# Patient Record
Sex: Female | Born: 1995 | Hispanic: No | Marital: Single | Smoking: Never smoker
Health system: Southern US, Community
[De-identification: ages and names within clinical notes are randomized; demographics above are authoritative.]

## PROBLEM LIST (undated history)

## (undated) ENCOUNTER — Inpatient Hospital Stay: Payer: Self-pay

## (undated) DIAGNOSIS — R7303 Prediabetes: Secondary | ICD-10-CM

## (undated) DIAGNOSIS — O24419 Gestational diabetes mellitus in pregnancy, unspecified control: Secondary | ICD-10-CM

## (undated) DIAGNOSIS — E559 Vitamin D deficiency, unspecified: Secondary | ICD-10-CM

## (undated) DIAGNOSIS — N946 Dysmenorrhea, unspecified: Secondary | ICD-10-CM

## (undated) HISTORY — DX: Dysmenorrhea, unspecified: N94.6

## (undated) HISTORY — PX: NO PAST SURGERIES: SHX2092

## (undated) HISTORY — DX: Gestational diabetes mellitus in pregnancy, unspecified control: O24.419

## (undated) HISTORY — DX: Vitamin D deficiency, unspecified: E55.9

## (undated) HISTORY — DX: Prediabetes: R73.03

---
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
## 2018-01-06 ENCOUNTER — Encounter: Payer: Self-pay | Admitting: Obstetrics and Gynecology

## 2018-01-06 ENCOUNTER — Other Ambulatory Visit: Payer: Self-pay

## 2018-01-06 ENCOUNTER — Ambulatory Visit (INDEPENDENT_AMBULATORY_CARE_PROVIDER_SITE_OTHER): Payer: 59 | Admitting: Obstetrics and Gynecology

## 2018-01-06 VITALS — BP 118/72 | HR 63 | Ht 64.0 in | Wt 155.0 lb

## 2018-01-06 DIAGNOSIS — N898 Other specified noninflammatory disorders of vagina: Secondary | ICD-10-CM | POA: Diagnosis not present

## 2018-01-06 DIAGNOSIS — Z113 Encounter for screening for infections with a predominantly sexual mode of transmission: Secondary | ICD-10-CM

## 2018-01-06 DIAGNOSIS — N941 Unspecified dyspareunia: Secondary | ICD-10-CM | POA: Diagnosis not present

## 2018-01-06 DIAGNOSIS — Z3009 Encounter for other general counseling and advice on contraception: Secondary | ICD-10-CM

## 2018-01-06 DIAGNOSIS — Z01419 Encounter for gynecological examination (general) (routine) without abnormal findings: Secondary | ICD-10-CM | POA: Diagnosis not present

## 2018-01-06 LAB — POCT WET PREP WITH KOH
CLUE CELLS WET PREP PER HPF POC: NEGATIVE
KOH Prep POC: NEGATIVE
RBC Wet Prep HPF POC: POSITIVE
Trichomonas, UA: NEGATIVE
Yeast Wet Prep HPF POC: NEGATIVE

## 2018-01-06 NOTE — Patient Instructions (Signed)
I value your feedback and entrusting us with your care. If you get a [facility] patient survey, I would appreciate you taking the time to let us know about your experience today. Thank you! 

## 2018-01-06 NOTE — Progress Notes (Signed)
PCP:  Patient, [name]   Chief Complaint  Patient presents with  . Gynecologic Exam    Pain w/intercourse and discharge white not itchy x 6-7 mos     HPI:      [name] is a 22 y.o. No obstetric history on file. who LMP was Patient's last menstrual period was 01/05/2018., presents today for her NP annual examination.  Her menses are every 1-3 months (normal for pt and getting more regular), lasting 4-7 days.  Dysmenorrhea mild, occurring first 1-2 days of flow. She does not have intermenstrual bleeding.  Sex activity: single partner, contraception - condoms. Declines other BC. Has had vaginal pain with sex the past 3 times. Doesn't use lubricants but feels lubricated enough naturally.  Hx of STDs: none Complains of white, creamy d/c wiith "grits inside" for the past 6-7 months, no itch/irritation/odor.   There is no FH of breast cancer. There is no FH of ovarian cancer. The patient does not do self-breast exams.  Tobacco use: The patient denies current or previous tobacco use. Alcohol use: none No drug use.  Exercise: not active  She does not get adequate calcium and Vitamin D in her diet.  She thinks she had [place] vaccine.   Past Medical History:  Diagnosis Date  . Dysmenorrhea     Past Surgical History:  Procedure Laterality Date  . NO PAST SURGERIES      Family History  Problem Relation Age of Onset  . Diabetes Mother   . Hypertension Mother   . Diabetes Father   . Hypertension Father     Social History   Socioeconomic History  . Marital status: Single    Spouse name: Not on file  . Number of children: Not on file  . Years of education: Not on file  . Highest education level: Not on file  Occupational History  . Not on file  Social Needs  . Financial resource strain: Not on file  . Food insecurity:    Worry: Not on file    Inability: Not on file  . Transportation needs:    Medical: Not on file    Non-medical: Not on file  Tobacco Use    . Smoking status: Never Smoker  . Smokeless tobacco: Never Used  Substance and Sexual Activity  . Alcohol use: Never    Frequency: Never  . Drug use: Never  . Sexual activity: Yes    Partners: Male    Birth control/protection: Condom  Lifestyle  . Physical activity:    Days per week: 0 days    Minutes per session: Not on file  . Stress: Not on file  Relationships  . Social connections:    Talks on phone: Not on file    Gets together: Not on file    Attends religious service: Not on file    Active member of club or organization: Not on file    Attends meetings of clubs or organizations: Not on file    Relationship status: Not on file  . Intimate partner violence:    Fear of current or ex partner: Not on file    Emotionally abused: Not on file    Physically abused: Not on file    Forced sexual activity: Not on file  Other Topics Concern  . Not on file  Social History Narrative  . Not on file    No outpatient medications prior to visit.   No facility-administered medications prior to visit.  ROS:  Review of Systems  Constitutional: Negative for fatigue, fever and unexpected weight change.  Respiratory: Negative for cough, shortness of breath and wheezing.   Cardiovascular: Negative for chest pain, palpitations and leg swelling.  Gastrointestinal: Negative for blood in stool, constipation, diarrhea, nausea and vomiting.  Endocrine: Negative for cold intolerance, heat intolerance and polyuria.  Genitourinary: Positive for dyspareunia and vaginal discharge. Negative for dysuria, flank pain, frequency, genital sores, hematuria, menstrual problem, pelvic pain, urgency, vaginal bleeding and vaginal pain.  Musculoskeletal: Negative for back pain, joint swelling and myalgias.  Skin: Negative for rash.  Neurological: Negative for dizziness, syncope, light-headedness, numbness and headaches.  Hematological: Negative for adenopathy.  Psychiatric/Behavioral: Positive for  dysphoric mood. Negative for agitation, confusion, sleep disturbance and suicidal ideas. The patient is not nervous/anxious.    BREAST: No symptoms   Objective: BP 118/72 (BP Location: Left Arm, Patient Position: Sitting, Cuff Size: Normal)   Pulse 63   Ht  (1.626 m)   Wt 155 lb (70.3 kg)   LMP 01/05/2018   BMI 26.61 kg/m    Physical Exam  Constitutional: She is oriented to person, place, and time. She appears well-developed and well-nourished.  Genitourinary: Uterus normal. There is no rash or tenderness on the right labia. There is no rash or tenderness on the left labia. There is bleeding in the vagina. No erythema or tenderness in the vagina. No vaginal discharge found. Right adnexum does not display mass and does not display tenderness. Left adnexum does not display mass and does not display tenderness. Cervix does not exhibit motion tenderness or polyp. Uterus is not enlarged or tender.  Neck: Normal range of motion. No thyromegaly present.  Cardiovascular: Normal rate, regular rhythm and normal heart sounds.  No murmur heard. Pulmonary/Chest: Effort normal and breath sounds normal. Right breast exhibits no mass, no nipple discharge, no skin change and no tenderness. Left breast exhibits no mass, no nipple discharge, no skin change and no tenderness.  Abdominal: Soft. There is no tenderness. There is no guarding.  Musculoskeletal: Normal range of motion.  Neurological: She is alert and oriented to person, place, and time. No cranial nerve deficit.  Psychiatric: She has a normal mood and affect. Her behavior is normal.  Vitals reviewed.   Results: Results for orders placed or performed in visit on 01/06/18 (from the past 24 hour(s))  POCT Wet Prep with KOH     Status: Normal   Collection Time: 01/06/18  4:44 PM  Result Value Ref Range   Trichomonas, UA Negative    Clue Cells Wet Prep HPF POC neg    Epithelial Wet Prep HPF POC  Few, Moderate, Many, Too numerous to count     Yeast Wet Prep HPF POC neg    Bacteria Wet Prep HPF POC  Few   RBC Wet Prep HPF POC pos    WBC Wet Prep HPF POC     KOH Prep POC Negative Negative   Having period today  Assessment/Plan: Encounter for annual routine gynecological examination  Screening for STD (sexually transmitted disease) - Plan: Chlamydia/Gonococcus/Trichomonas, NAA  Vaginal discharge - Neg wet prep today but bleeding. Check gon/chlam. F/u prn.  - Plan: POCT Wet Prep with KOH  Dyspareunia in female - Vag sx. Neg wet prep/exam. Check gon/chlam. If neg, question spermicide. Use spermicide-free condoms. F/u after menses prn sx.  Encounter for other general counseling or advice on contraception - Pt declines BC. Condoms. F/u prn.  GYN counsel STD prevention, adequate intake of calcium and vitamin D, diet and exercise     F/U  Return in about 1 year (around 01/07/2019).  Carisha Kantor B. Santoria Chason, PA-C 01/06/2018 4:50 PM

## 2018-01-07 MED ORDER — NORETHIN ACE-ETH ESTRAD-FE 1-20 MG-MCG(24) PO TABS: 1.0000 | ORAL_TABLET | Freq: Every day | ORAL | 3 refills | Status: DC

## 2018-01-07 NOTE — Telephone Encounter (Signed)
Spoke with pt re: OCPs. LMP 01/05/18. Never been on BC. No hx of HTN/DVTs/seizures. Rx lomedia eRxd. Start this wk. Condoms for 1 mo. F/u prn.

## 2018-01-08 LAB — CHLAMYDIA/GONOCOCCUS/TRICHOMONAS, NAA
CHLAMYDIA BY NAA: NEGATIVE
GONOCOCCUS BY NAA: NEGATIVE
TRICH VAG BY NAA: NEGATIVE

## 2018-06-24 ENCOUNTER — Encounter: Payer: Self-pay | Admitting: Obstetrics and Gynecology

## 2018-06-24 ENCOUNTER — Ambulatory Visit: Payer: Managed Care, Other (non HMO) | Admitting: Obstetrics and Gynecology

## 2018-06-24 ENCOUNTER — Other Ambulatory Visit (HOSPITAL_COMMUNITY)
Admission: RE | Admit: 2018-06-24 | Discharge: 2018-06-24 | Disposition: A | Discharge status: Home / Self Care | Payer: Managed Care, Other (non HMO) | Source: Ambulatory Visit | Attending: Obstetrics and Gynecology | Admitting: Obstetrics and Gynecology

## 2018-06-24 VITALS — BP 108/60 | HR 71 | Ht 64.0 in | Wt 155.0 lb

## 2018-06-24 DIAGNOSIS — Z113 Encounter for screening for infections with a predominantly sexual mode of transmission: Secondary | ICD-10-CM

## 2018-06-24 DIAGNOSIS — N898 Other specified noninflammatory disorders of vagina: Secondary | ICD-10-CM | POA: Insufficient documentation

## 2018-06-24 DIAGNOSIS — Z3041 Encounter for surveillance of contraceptive pills: Secondary | ICD-10-CM | POA: Diagnosis not present

## 2018-06-24 DIAGNOSIS — N760 Acute vaginitis: Secondary | ICD-10-CM | POA: Diagnosis not present

## 2018-06-24 LAB — POCT WET PREP WITH KOH
Clue Cells Wet Prep HPF POC: NEGATIVE
KOH PREP POC: NEGATIVE
TRICHOMONAS UA: NEGATIVE
Yeast Wet Prep HPF POC: NEGATIVE

## 2018-06-24 MED ORDER — FLUCONAZOLE 150 MG PO TABS: 150.0000 mg | ORAL_TABLET | Freq: Once | ORAL | 0 refills | Status: DC

## 2018-06-24 NOTE — Patient Instructions (Signed)
I value your feedback and entrusting us with your care. If you get a [facility] patient survey, I would appreciate you taking the time to let us know about your experience today. Thank you! 

## 2018-06-24 NOTE — Progress Notes (Signed)
Patient, [name]   Chief Complaint  Patient presents with  . STD screening    HPI:      [name] is a 22 y.o. [id number] who LMP was Patient's last menstrual period was 06/10/2018 (approximate)., presents today for STD testing.  Had neg STD testing 5/19 but had a new partner since then. Pt denies any known exposures, but wants to be safe. Used condoms most of the time. Was on OCPs but stopped them when no longer sex active. Doesn't plan to be sex active in near future. No side effects with OCPs 5/19. Has had vaginal irritation and swelling for the past 3-4 days. No increased d/c, odor. No recent abx use.    Past Medical History:  Diagnosis Date  . Dysmenorrhea     Past Surgical History:  Procedure Laterality Date  . NO PAST SURGERIES      Family History  Problem Relation Age of Onset  . Diabetes Mother   . Hypertension Mother   . Diabetes Father   . Hypertension Father     Social History   Socioeconomic History  . Marital status: Single    Spouse name: Not on file  . Number of children: Not on file  . Years of education: Not on file  . Highest education level: Not on file  Occupational History  . Not on file  Social Needs  . Financial resource strain: Not on file  . Food insecurity:    Worry: Not on file    Inability: Not on file  . Transportation needs:    Medical: Not on file    Non-medical: Not on file  Tobacco Use  . Smoking status: Never Smoker  . Smokeless tobacco: Never Used  Substance and Sexual Activity  . Alcohol use: Never    Frequency: Never  . Drug use: Never  . Sexual activity: Yes    Partners: Male    Birth control/protection: None  Lifestyle  . Physical activity:    Days per week: 0 days    Minutes per session: Not on file  . Stress: Not on file  Relationships  . Social connections:    Talks on phone: Not on file    Gets together: Not on file    Attends religious service: Not on file    Active member of club or  organization: Not on file    Attends meetings of clubs or organizations: Not on file    Relationship status: Not on file  . Intimate partner violence:    Fear of current or ex partner: Not on file    Emotionally abused: Not on file    Physically abused: Not on file    Forced sexual activity: Not on file  Other Topics Concern  . Not on file  Social History Narrative  . Not on file    Outpatient Medications Prior to Visit  Medication Sig Dispense Refill  . Norethindrone Acetate-Ethinyl Estrad-FE (MICROGESTIN 24 FE) 1-20 MG-MCG(24) tablet Take 1 tablet by mouth daily. (Patient not taking: Reported on 06/24/2018) 84 tablet 3   No facility-administered medications prior to visit.     ROS:  Review of Systems  Constitutional: Negative for fever.  Gastrointestinal: Negative for blood in stool, constipation, diarrhea, nausea and vomiting.  Genitourinary: Negative for dyspareunia, dysuria, flank pain, frequency, hematuria, urgency, vaginal bleeding, vaginal discharge and vaginal pain.  Musculoskeletal: Negative for back pain.  Skin: Negative for rash.      OBJECTIVE:  Vitals:  BP 108/60   Pulse 71   Ht 5\' 4"  (1.626 m)   Wt 155 lb (70.3 kg)   LMP 06/10/2018 (Approximate)   BMI 26.61 kg/m   Physical Exam  Constitutional: She is oriented to person, place, and time. Vital signs are normal. She appears well-developed.  Pulmonary/Chest: Effort normal.  Genitourinary: Uterus normal. There is no rash, tenderness or lesion on the right labia. There is no rash, tenderness or lesion on the left labia. Uterus is not enlarged and not tender. Cervix exhibits no motion tenderness. Right adnexum displays no mass and no tenderness. Left adnexum displays no mass and no tenderness. There is erythema in the vagina. No tenderness in the vagina. Vaginal discharge found.  Musculoskeletal: Normal range of motion.  Neurological: She is alert and oriented to person, place, and time.  Psychiatric: She  has a normal mood and affect. Her behavior is normal. Thought content normal.  Vitals reviewed.   Results: Results for orders placed or performed in visit on 06/24/18 (from the past 24 hour(s))  POCT Wet Prep with KOH     Status: Normal   Collection Time: 06/24/18  9:57 AM  Result Value Ref Range   Trichomonas, UA Negative    Clue Cells Wet Prep HPF POC neg    Epithelial Wet Prep HPF POC     Yeast Wet Prep HPF POC neg    Bacteria Wet Prep HPF POC     RBC Wet Prep HPF POC     WBC Wet Prep HPF POC     KOH Prep POC Negative Negative     Assessment/Plan: Acute vaginitis - Pos sx/ exam. Neg wet prep. Treat empirically with Rx diflucan. F/u prn. Dove sens skin soap. - Plan: POCT Wet Prep with KOH, fluconazole (DIFLUCAN) 150 MG tablet  Vaginal discharge - Plan: POCT Wet Prep with KOH, Cervicovaginal ancillary only  Screening for STD (sexually transmitted disease) - Will call if results pos. Condoms.  - Plan: Cervicovaginal ancillary only  Encounter for surveillance of contraceptive pills - Restart OCPs if going to be sex active. Has 1 yr Rx sent 5/19.    Meds ordered this encounter  Medications  . fluconazole (DIFLUCAN) 150 MG tablet    Sig: Take 1 tablet (150 mg total) by mouth once for 1 dose.    Dispense:  1 tablet    Refill:  0    Order Specific Question:   Supervising Provider    Answer:   Nadara Mustard [[id number]]      Return if symptoms worsen or fail to improve.  Katlin Ciszewski B. Chenise Mulvihill, PA-C 06/24/2018 9:58 AM

## 2018-06-25 LAB — CERVICOVAGINAL ANCILLARY ONLY
Chlamydia: NEGATIVE
Neisseria Gonorrhea: NEGATIVE
Trichomonas: NEGATIVE

## 2018-08-20 ENCOUNTER — Ambulatory Visit
Admission: EM | Admit: 2018-08-20 | Discharge: 2018-08-20 | Disposition: A | Discharge status: Home / Self Care | Payer: Managed Care, Other (non HMO) | Attending: Family Medicine | Admitting: Family Medicine

## 2018-08-20 ENCOUNTER — Encounter: Payer: Self-pay | Admitting: Emergency Medicine

## 2018-08-20 ENCOUNTER — Other Ambulatory Visit: Payer: Self-pay

## 2018-08-20 DIAGNOSIS — J020 Streptococcal pharyngitis: Secondary | ICD-10-CM | POA: Diagnosis not present

## 2018-08-20 DIAGNOSIS — Z202 Contact with and (suspected) exposure to infections with a predominantly sexual mode of transmission: Secondary | ICD-10-CM | POA: Diagnosis not present

## 2018-08-20 LAB — CHLAMYDIA/NGC RT PCR (ARMC ONLY)
Chlamydia Tr: NOT DETECTED
N gonorrhoeae: NOT DETECTED

## 2018-08-20 LAB — RAPID STREP SCREEN (MED CTR MEBANE ONLY): Streptococcus, Group A Screen (Direct): POSITIVE — AB

## 2018-08-20 LAB — WET PREP, GENITAL
Clue Cells Wet Prep HPF POC: NONE SEEN
SPERM: NONE SEEN
Trich, Wet Prep: NONE SEEN
YEAST WET PREP: NONE SEEN

## 2018-08-20 MED ORDER — AMOXICILLIN 500 MG PO TABS: 500.0000 mg | ORAL_TABLET | Freq: Two times a day (BID) | ORAL | 0 refills | Status: DC

## 2018-08-20 NOTE — ED Provider Notes (Signed)
[facility]    CSN: [id number] Arrival date & time: 08/20/18  1630  History   Chief Complaint Chief Complaint  Patient presents with  . Exposure to STD   HPI  23 year old female presents for above complaint.  She also reports sore throat.  Patient reports that she was notified today that her significant other tested positive for gonorrhea recently.  Patient states that she has had some vaginal itching intermittently over the past 2 to 3 weeks reports intermittent abdominal pain but she is not sure that is related.  No fever.  No vaginal discharge.  No other associated symptoms.  Additionally, patient states that she developed sore throat today.  She has had recent contact with a sick relative.  No other reported symptoms.  No other complaints.  PMH, Surgical Hx, Family Hx, Social History reviewed and updated as below.  Past Medical History:  Diagnosis Date  . Dysmenorrhea    Past Surgical History:  Procedure Laterality Date  . NO PAST SURGERIES      OB History    Gravida  0   Para  0   Term  0   Preterm  0   AB  0   Living  0     SAB  0   TAB  0   Ectopic  0   Multiple  0   Live Births  0            Home Medications    Prior to Admission medications   Medication Sig Start Date End Date Taking? Authorizing Provider  amoxicillin (AMOXIL) 500 MG tablet Take 1 tablet (500 mg total) by mouth 2 (two) times daily. 08/20/18   Tommie Samsook, Ailis Rigaud G, DO    Family History Family History  Problem Relation Age of Onset  . Diabetes Mother   . Hypertension Mother   . Diabetes Father   . Hypertension Father     Social History Social History   Tobacco Use  . Smoking status: Never Smoker  . Smokeless tobacco: Never Used  Substance Use Topics  . Alcohol use: Never    Frequency: Never  . Drug use: Never     Allergies   Patient has no known allergies.   Review of Systems Review of Systems  Constitutional: Negative for fever.  HENT: Positive  for sore throat.   Genitourinary: Negative for vaginal discharge.   Physical Exam Triage Vital Signs ED Triage Vitals  Enc Vitals Group     BP 08/20/18 1657 116/69     Pulse Rate 08/20/18 1657 63     Resp 08/20/18 1657 20     Temp 08/20/18 1657 98.2 F (36.8 C)     Temp Source 08/20/18 1657 Oral     SpO2 08/20/18 1657 100 %     Weight 08/20/18 1654 155 lb (70.3 kg)     Height 08/20/18 1654 5\' 4"  (1.626 m)     Head Circumference --      Peak Flow --      Pain Score 08/20/18 1654 0     Pain Loc --      Pain Edu? --      Excl. in GC? --    Updated Vital Signs BP 116/69   Pulse 63   Temp 98.2 F (36.8 C) (Oral)   Resp 20   Ht 5\' 4"  (1.626 m)   Wt 70.3 kg   LMP 08/17/2018   SpO2 100%   BMI 26.61 kg/m  Visual Acuity Right Eye Distance:   Left Eye Distance:   Bilateral Distance:    Right Eye Near:   Left Eye Near:    Bilateral Near:     Physical Exam Vitals signs and nursing note reviewed.  Constitutional:      General: She is not in acute distress. HENT:     Head: Normocephalic and atraumatic.     Mouth/Throat:     Comments: Oropharynx with moderate erythema.  Enlarged tonsils. Cardiovascular:     Rate and Rhythm: Normal rate and regular rhythm.  Pulmonary:     Effort: Pulmonary effort is normal.     Breath sounds: No wheezing, rhonchi or rales.  Abdominal:     General: There is no distension.     Palpations: Abdomen is soft.     Tenderness: There is no abdominal tenderness.  Neurological:     Mental Status: She is alert.  Psychiatric:        Mood and Affect: Mood normal.        Behavior: Behavior normal.    UC Treatments / Results  Labs (all labs ordered are listed, but only abnormal results are displayed) Labs Reviewed  WET PREP, GENITAL - Abnormal; Notable for the following components:      Result Value   WBC, Wet Prep HPF POC FEW (*)    All other components within normal limits  RAPID STREP SCREEN ([facility] ONLY) - Abnormal; Notable  for the following components:   Streptococcus, Group A Screen (Direct) POSITIVE (*)    All other components within normal limits  CHLAMYDIA/NGC RT PCR [facility] ONLY)    EKG None  Radiology No results found.  Procedures Procedures (including critical care time)  Medications Ordered in UC Medications - No data to display  Initial Impression / Assessment and Plan / UC Course  I have reviewed the triage vital signs and the nursing notes.  Pertinent labs & imaging results that were available during my care of the patient were reviewed by me and considered in my medical decision making (see chart for details).    23 year old female presents with STD exposure and sore throat.  Wet prep negative.  Awaiting GC and chlamydia.  Patient positive strep test today.  Placed on amoxicillin.  Final Clinical Impressions(s) / UC Diagnoses   Final diagnoses:  Possible exposure to STD  Strep pharyngitis     Discharge Instructions     Medication as prescribed.  Take care  Dr. Adriana Simas    ED Prescriptions    Medication Sig Dispense Auth. Provider   amoxicillin (AMOXIL) 500 MG tablet Take 1 tablet (500 mg total) by mouth 2 (two) times daily. 20 tablet Tommie Sams, DO     Controlled Substance Prescriptions [place] Controlled Substance Registry consulted? Not Applicable   Tommie Sams, DO 08/20/18 [id number]

## 2018-08-20 NOTE — Discharge Instructions (Signed)
Medication as prescribed.  Take care  Dr. Trevon Strothers  

## 2018-08-20 NOTE — ED Triage Notes (Signed)
Patient states she was told a partner of hers had tested positive for gonorrhea 2 weeks ago. She does endorse vaginal itching but denies any other symptoms.

## 2018-08-21 ENCOUNTER — Telehealth: Payer: Self-pay | Admitting: Obstetrics and Gynecology

## 2018-08-21 ENCOUNTER — Other Ambulatory Visit: Payer: Self-pay | Admitting: Obstetrics and Gynecology

## 2018-08-21 DIAGNOSIS — N760 Acute vaginitis: Secondary | ICD-10-CM

## 2018-08-21 MED ORDER — FLUCONAZOLE 150 MG PO TABS: 150.0000 mg | ORAL_TABLET | Freq: Once | ORAL | 0 refills | Status: AC

## 2018-08-21 NOTE — Telephone Encounter (Signed)
Pt aware.

## 2018-08-21 NOTE — Progress Notes (Signed)
Rx RF diflucan for vaginal itch/pt on amox for strep throat.

## 2018-08-21 NOTE — Telephone Encounter (Signed)
What results? Pt not seen here since 11/19.

## 2018-08-21 NOTE — Telephone Encounter (Signed)
Rx diflucan eRxd. F/u prn.

## 2018-08-21 NOTE — Telephone Encounter (Signed)
Called pt to get more info on what lab results. Wasn't satisfied with the "NOT DETECTED" results she got back form urgent care, and wanted [facility]'s opinion if she should get any further testing. Per [facility], she doesn't need any further testing, results are accurate. Pt has been having on/off itchiness for the past 2-3 weeks, no /discharge/odor/irritation. She said a while back she had the same symptom and you gave her a pill and it made it go away. Can she get that pill again? Please advise.

## 2018-08-21 NOTE — Telephone Encounter (Signed)
Patient is calling for labs results. Please advise. 

## 2018-08-22 ENCOUNTER — Ambulatory Visit: Payer: Managed Care, Other (non HMO) | Admitting: Obstetrics and Gynecology

## 2018-09-15 ENCOUNTER — Other Ambulatory Visit: Payer: Self-pay | Admitting: Obstetrics and Gynecology

## 2018-09-15 ENCOUNTER — Ambulatory Visit (INDEPENDENT_AMBULATORY_CARE_PROVIDER_SITE_OTHER): Payer: Managed Care, Other (non HMO) | Admitting: Obstetrics and Gynecology

## 2018-09-15 ENCOUNTER — Telehealth: Payer: Self-pay

## 2018-09-15 ENCOUNTER — Encounter: Payer: Self-pay | Admitting: Obstetrics and Gynecology

## 2018-09-15 VITALS — BP 110/74 | HR 67 | Ht 64.0 in | Wt 163.0 lb

## 2018-09-15 DIAGNOSIS — N898 Other specified noninflammatory disorders of vagina: Secondary | ICD-10-CM

## 2018-09-15 LAB — POCT WET PREP WITH KOH
Clue Cells Wet Prep HPF POC: NEGATIVE
KOH PREP POC: NEGATIVE
Trichomonas, UA: NEGATIVE
YEAST WET PREP PER HPF POC: NEGATIVE

## 2018-09-15 MED ORDER — NORETHIN ACE-ETH ESTRAD-FE 1-20 MG-MCG(24) PO TABS: 1.0000 | ORAL_TABLET | Freq: Every day | ORAL | 0 refills | Status: DC

## 2018-09-15 NOTE — Patient Instructions (Signed)
I value your feedback and entrusting us with your care. If you get a [facility] patient survey, I would appreciate you taking the time to let us know about your experience today. Thank you! 

## 2018-09-15 NOTE — Progress Notes (Unsigned)
Rx OCP RF for pt to start with menses.

## 2018-09-15 NOTE — Progress Notes (Signed)
Patient, [name]   Chief Complaint  Patient presents with  . Vaginitis    itchiness and irritation, not sure about discharge, no odor x 1 week    HPI:      [name] is a 23 y.o. [id number] who LMP was Patient's last menstrual period was 09/12/2018 (approximate)., presents today for vaginal irritation/itching and dysuria for the past wk. No increased d/c, odor. Sx usually triggered by sex and then resolve after a wk. Pt isn't sex active routinely but has frequent sex when she is active. Uses condoms, no lubricants. Uses scented soap, no dryer sheets. Has used monistat-7 for 4 nights now. Treated for yeast vag with diflucan 08/21/18 after amox use. Sx resolved. Neg STD testing 08/20/18.   Past Medical History:  Diagnosis Date  . Dysmenorrhea     Past Surgical History:  Procedure Laterality Date  . NO PAST SURGERIES      Family History  Problem Relation Age of Onset  . Diabetes Mother   . Hypertension Mother   . Diabetes Father   . Hypertension Father     Social History   Socioeconomic History  . Marital status: Single    Spouse name: Not on file  . Number of children: Not on file  . Years of education: Not on file  . Highest education level: Not on file  Occupational History  . Not on file  Social Needs  . Financial resource strain: Not on file  . Food insecurity:    Worry: Not on file    Inability: Not on file  . Transportation needs:    Medical: Not on file    Non-medical: Not on file  Tobacco Use  . Smoking status: Never Smoker  . Smokeless tobacco: Never Used  Substance and Sexual Activity  . Alcohol use: Never    Frequency: Never  . Drug use: Never  . Sexual activity: Yes    Partners: Male    Birth control/protection: Condom, None  Lifestyle  . Physical activity:    Days per week: 0 days    Minutes per session: Not on file  . Stress: Not on file  Relationships  . Social connections:    Talks on phone: Not on file    Gets together: Not  on file    Attends religious service: Not on file    Active member of club or organization: Not on file    Attends meetings of clubs or organizations: Not on file    Relationship status: Not on file  . Intimate partner violence:    Fear of current or ex partner: Not on file    Emotionally abused: Not on file    Physically abused: Not on file    Forced sexual activity: Not on file  Other Topics Concern  . Not on file  Social History Narrative  . Not on file    Outpatient Medications Prior to Visit  Medication Sig Dispense Refill  . amoxicillin (AMOXIL) 500 MG tablet Take 1 tablet (500 mg total) by mouth 2 (two) times daily. 20 tablet 0   No facility-administered medications prior to visit.       ROS:  Review of Systems  Constitutional: Negative for fever.  Gastrointestinal: Negative for blood in stool, constipation, diarrhea, nausea and vomiting.  Genitourinary: Positive for dyspareunia, dysuria, vaginal discharge and vaginal pain. Negative for flank pain, frequency, hematuria, urgency and vaginal bleeding.  Musculoskeletal: Negative for back pain.  Skin: Negative for  rash.   BREAST: No symptoms   OBJECTIVE:   Vitals:  BP 110/74   Pulse 67   Ht 5\' 4"  (1.626 m)   Wt 163 lb (73.9 kg)   LMP 09/12/2018 (Approximate)   BMI 27.98 kg/m   Physical Exam Vitals signs reviewed.  Constitutional:      Appearance: She is well-developed.  Pulmonary:     Effort: Pulmonary effort is normal.  Genitourinary:    General: Normal vulva.     Pubic Area: No rash.      Labia:        Right: No rash, tenderness or lesion.        Left: No rash, tenderness or lesion.      Vagina: Bleeding present. No vaginal discharge, erythema or tenderness.     Cervix: Normal.     Uterus: Normal. Not enlarged and not tender.      Adnexa: Right adnexa normal and left adnexa normal.       Right: No mass or tenderness.         Left: No mass or tenderness.    Musculoskeletal: Normal range of motion.   Neurological:     Mental Status: She is alert and oriented to person, place, and time.  Psychiatric:        Behavior: Behavior normal.        Thought Content: Thought content normal.     Results: Results for orders placed or performed in visit on 09/15/18 (from the past 24 hour(s))  POCT Wet Prep with KOH     Status: Normal   Collection Time: 09/15/18 11:44 AM  Result Value Ref Range   Trichomonas, UA Negative    Clue Cells Wet Prep HPF POC neg    Epithelial Wet Prep HPF POC     Yeast Wet Prep HPF POC neg    Bacteria Wet Prep HPF POC     RBC Wet Prep HPF POC     WBC Wet Prep HPF POC     KOH Prep POC Negative Negative     Assessment/Plan: Vaginal irritation - Plan: POCT Wet Prep with KOH  Neg wet prep/exam. Pt currently having period. Question vag irritation due to trauma from frequent sex. Use spermicide-free condoms, lubricant. OTC hydrocortisone crm ext BID after sex to see if sx improve. Use dove sens skin soap. If sx persist, will treat with Rx terazol. F/u prn.     Return if symptoms worsen or fail to improve.  Ardeth Repetto B. Damiyah Ditmars, PA-C 09/15/2018 11:46 AM

## 2018-09-15 NOTE — Telephone Encounter (Signed)
Pt told you wrong.  She was taken off bcp last visit.  Would like to go back on the bcp [facility] gave her the first time.  [facility] [address] - told you wrong about pharm too.  [phone]

## 2018-09-15 NOTE — Telephone Encounter (Signed)
OCP Rx eRxd. Start today or tomorrow since having menses now. Condoms for 1 mo.

## 2018-09-15 NOTE — Telephone Encounter (Signed)
Please see

## 2018-09-15 NOTE — Telephone Encounter (Signed)
Pt aware.

## 2018-11-09 ENCOUNTER — Other Ambulatory Visit: Payer: Self-pay

## 2018-11-09 ENCOUNTER — Ambulatory Visit
Admission: EM | Admit: 2018-11-09 | Discharge: 2018-11-09 | Disposition: A | Discharge status: Home / Self Care | Payer: Managed Care, Other (non HMO) | Attending: Family Medicine | Admitting: Family Medicine

## 2018-11-09 DIAGNOSIS — K529 Noninfective gastroenteritis and colitis, unspecified: Secondary | ICD-10-CM

## 2018-11-09 NOTE — Discharge Instructions (Signed)
Clear liquids then advance diet slowly as tolerated °Imodium AD as needed ° °

## 2018-11-09 NOTE — ED Provider Notes (Signed)
[facility]    CSN: [id number] Arrival date & time: 11/09/18  1513     History   Chief Complaint Chief Complaint  Patient presents with  . Diarrhea    HPI Daisy Douglas is a 24 y.o. female.   23 yo female with a intermittent diarrhea for the past 2 weeks. States seemed to be improving then had a "relapse" several days ago. Denies any fevers, chills, vomiting.   The history is provided by the patient.    Past Medical History:  Diagnosis Date  . Dysmenorrhea     There are no active problems to display for this patient.   Past Surgical History:  Procedure Laterality Date  . NO PAST SURGERIES      OB History    Gravida  0   Para  0   Term  0   Preterm  0   AB  0   Living  0     SAB  0   TAB  0   Ectopic  0   Multiple  0   Live Births  0            Home Medications    Prior to Admission medications   Medication Sig Start Date End Date Taking? Authorizing Provider  Norethindrone Acetate-Ethinyl Estrad-FE (MICROGESTIN 24 FE) 1-20 MG-MCG(24) tablet Take 1 tablet by mouth daily. 09/15/18  Yes Copland, Helmut Muster B, PA-C  amoxicillin (AMOXIL) 500 MG tablet Take 1 tablet (500 mg total) by mouth 2 (two) times daily. 08/20/18   Tommie Sams, DO  naproxen (NAPROSYN) 500 MG tablet Take 500 mg by mouth 2 (two) times daily with a meal. 10/29/18   [provider]    Family History Family History  Problem Relation Age of Onset  . Diabetes Mother   . Hypertension Mother   . Diabetes Father   . Hypertension Father     Social History Social History   Tobacco Use  . Smoking status: Never Smoker  . Smokeless tobacco: Never Used  Substance Use Topics  . Alcohol use: Never    Frequency: Never  . Drug use: Never     Allergies   Patient has no known allergies.   Review of Systems Review of Systems   Physical Exam Triage Vital Signs ED Triage Vitals  Enc Vitals Group     BP 11/09/18 1545 125/78     Pulse Rate 11/09/18  1545 69     Resp 11/09/18 1545 18     Temp 11/09/18 1545 98.3 F (36.8 C)     Temp Source 11/09/18 1545 Oral     SpO2 11/09/18 1545 100 %     Weight 11/09/18 1548 162 lb (73.5 kg)     Height 11/09/18 1548 5\' 4"  (1.626 m)     Head Circumference --      Peak Flow --      Pain Score 11/09/18 1548 0     Pain Loc --      Pain Edu? --      Excl. in GC? --    No data found.  Updated Vital Signs BP 125/78 (BP Location: Right Arm)   Pulse 69   Temp 98.3 F (36.8 C) (Oral)   Resp 18   Ht 5\' 4"  (1.626 m)   Wt 73.5 kg   LMP 10/11/2018 (Exact Date)   SpO2 100%   BMI 27.81 kg/m   Visual Acuity Right Eye Distance:   Left Eye Distance:  Bilateral Distance:    Right Eye Near:   Left Eye Near:    Bilateral Near:     Physical Exam Vitals signs and nursing note reviewed.  Constitutional:      General: She is not in acute distress.    Appearance: She is not toxic-appearing or diaphoretic.  Abdominal:     General: Bowel sounds are normal. There is no distension.     Palpations: Abdomen is soft. There is no mass.     Tenderness: There is no abdominal tenderness. There is no right CVA tenderness, left CVA tenderness, guarding or rebound.     Hernia: No hernia is present.  Neurological:     Mental Status: She is alert.      UC Treatments / Results  Labs (all labs ordered are listed, but only abnormal results are displayed) Labs Reviewed - No data to display  EKG None  Radiology No results found.  Procedures Procedures (including critical care time)  Medications Ordered in UC Medications - No data to display  Initial Impression / Assessment and Plan / UC Course  I have reviewed the triage vital signs and the nursing notes.  Pertinent labs & imaging results that were available during my care of the patient were reviewed by me and considered in my medical decision making (see chart for details).      Final Clinical Impressions(s) / UC Diagnoses   Final  diagnoses:  Gastroenteritis     Discharge Instructions     Clear liquids then advance diet slowly as tolerated Imodium AD as needed     ED Prescriptions    None     1. diagnosis reviewed with patien 2. Recommend supportive treatment as above 3. Follow-up prn if symptoms worsen or don't improve  Controlled Substance Prescriptions [place] Controlled Substance Registry consulted? Not Applicable   Payton Mccallum, MD 11/09/18 [phone]

## 2018-11-09 NOTE — ED Triage Notes (Signed)
Pt has been having episodes of diarrhea for 2 weeks. Has been going good until this week and did have 1 "relapse" episode. No otc meds tried. No fever or nausea reported but did feel lightheaded the first 2 days of this starting. Complained of loose stools. Has been able to eat 1 meal out of the day and snacking

## 2019-01-18 NOTE — Progress Notes (Signed)
PCP:  Patient, [name]   Chief Complaint  Patient presents with  . Gynecologic Exam    has a vaginal lump on the outside left side, no pain now, did before, had it checked out at PCP about 2-3 weeks ago     HPI:      [name] is a 23 y.o. No obstetric history on file. who LMP was Patient's last menstrual period was 01/15/2019 (approximate)., presents today for her annual examination.  Her menses are every 1 month on OCPs (usually less regular off OCPs), lasting 7-9 days.  Dysmenorrhea mild, occurring first 1-2 days of flow. She does not have intermenstrual bleeding. Stopped OCPs last month.  Sex activity: not sex active now; did have single partner and used OCPs Pap: none due to age Hx of STDs: none; last checked 1/20 Hx of recurrent ext vaginitis this past yr. Treated once with diflucan. Sx resolved. Pt did have painful lesion LT labia majora a few wks ago. Saw PCP and was treated with doxy. Sx resolved but then recurred a few days ago. Pt had purulent d/c for a few days and now it's almost gone.   There is no FH of breast cancer. There is no FH of ovarian cancer. The patient does not do self-breast exams.  Tobacco use: The patient denies current or previous tobacco use. Alcohol use: none No drug use.  Exercise: mod active  She does not get adequate calcium and Vitamin D in her diet.  Unsure about Delene RuffiniGard vaccine in past.   Past Medical History:  Diagnosis Date  . Dysmenorrhea     Past Surgical History:  Procedure Laterality Date  . NO PAST SURGERIES      Family History  Problem Relation Age of Onset  . Diabetes Mother   . Hypertension Mother   . Diabetes Father   . Hypertension Father   . Breast cancer Neg Hx   . Ovarian cancer Neg Hx     Social History   Socioeconomic History  . Marital status: Single    Spouse name: Not on file  . Number of children: Not on file  . Years of education: Not on file  . Highest education level: Not on file   Occupational History  . Not on file  Social Needs  . Financial resource strain: Not on file  . Food insecurity:    Worry: Not on file    Inability: Not on file  . Transportation needs:    Medical: Not on file    Non-medical: Not on file  Tobacco Use  . Smoking status: Never Smoker  . Smokeless tobacco: Never Used  Substance and Sexual Activity  . Alcohol use: Never    Frequency: Never  . Drug use: Never  . Sexual activity: Not Currently    Partners: Male    Birth control/protection: Pill  Lifestyle  . Physical activity:    Days per week: 0 days    Minutes per session: Not on file  . Stress: Not on file  Relationships  . Social connections:    Talks on phone: Not on file    Gets together: Not on file    Attends religious service: Not on file    Active member of club or organization: Not on file    Attends meetings of clubs or organizations: Not on file    Relationship status: Not on file  . Intimate partner violence:    Fear of current or ex partner:  Not on file    Emotionally abused: Not on file    Physically abused: Not on file    Forced sexual activity: Not on file  Other Topics Concern  . Not on file  Social History Narrative  . Not on file    Outpatient Medications Prior to Visit  Medication Sig Dispense Refill  . doxycycline (VIBRA-TABS) 100 MG tablet Take 100 mg by mouth 2 (two) times daily. for 10 days    . naproxen (NAPROSYN) 500 MG tablet Take 500 mg by mouth 2 (two) times daily with a meal.    . Norethindrone Acetate-Ethinyl Estrad-FE (MICROGESTIN 24 FE) 1-20 MG-MCG(24) tablet Take 1 tablet by mouth daily. 84 tablet 0  . amoxicillin (AMOXIL) 500 MG tablet Take 1 tablet (500 mg total) by mouth 2 (two) times daily. 20 tablet 0   No facility-administered medications prior to visit.       ROS:  Review of Systems  Constitutional: Negative for fatigue, fever and unexpected weight change.  Respiratory: Negative for cough, shortness of breath and  wheezing.   Cardiovascular: Negative for chest pain, palpitations and leg swelling.  Gastrointestinal: Negative for blood in stool, constipation, diarrhea, nausea and vomiting.  Endocrine: Negative for cold intolerance, heat intolerance and polyuria.  Genitourinary: Negative for dyspareunia, dysuria, flank pain, frequency, genital sores, hematuria, menstrual problem, pelvic pain, urgency, vaginal bleeding, vaginal discharge and vaginal pain.  Musculoskeletal: Negative for back pain, joint swelling and myalgias.  Skin: Negative for rash.  Neurological: Negative for dizziness, syncope, light-headedness, numbness and headaches.  Hematological: Negative for adenopathy.  Psychiatric/Behavioral: Negative for agitation, confusion, dysphoric mood, sleep disturbance and suicidal ideas. The patient is not nervous/anxious.   BREAST: No symptoms   Objective: BP 110/70   Ht 5\' 4"  (1.626 m)   Wt 163 lb 3.2 oz (74 kg)   LMP 01/15/2019 (Approximate)   BMI 28.01 kg/m    Physical Exam Constitutional:      Appearance: She is well-developed.  Genitourinary:     Uterus, right adnexa and left adnexa normal.     Vulval lesion present.     No vulval tenderness noted.        Vaginal bleeding present.     No vaginal discharge, erythema or tenderness.     No cervical motion tenderness or polyp.     Uterus is not enlarged or tender.     No right or left adnexal mass present.     Right adnexa not tender.     Left adnexa not tender.  Neck:     Musculoskeletal: Normal range of motion.     Thyroid: No thyromegaly.  Cardiovascular:     Rate and Rhythm: Normal rate and regular rhythm.     Heart sounds: Normal heart sounds. No murmur.  Pulmonary:     Effort: Pulmonary effort is normal.     Breath sounds: Normal breath sounds.  Chest:     Breasts:        Right: No mass, nipple discharge, skin change or tenderness.        Left: No mass, nipple discharge, skin change or tenderness.  Abdominal:      Palpations: Abdomen is soft.     Tenderness: There is no abdominal tenderness. There is no guarding.  Musculoskeletal: Normal range of motion.  Neurological:     General: No focal deficit present.     Mental Status: She is alert and oriented to person, place, and time.     Cranial Nerves:  No cranial nerve deficit.  Skin:    General: Skin is warm and dry.  Psychiatric:        Mood and Affect: Mood normal.        Behavior: Behavior normal.        Thought Content: Thought content normal.        Judgment: Judgment normal.  Vitals signs reviewed.     Assessment/Plan: Encounter for annual routine gynecological examination  Screening for STD (sexually transmitted disease) - Plan: Cervicovaginal ancillary only  Encounter for other general counseling or advice on contraception - Pt stopped OCPs since no longer sex active. Can restart different brand if becomes sex active again.          GYN counsel STD prevention, adequate intake of calcium and vitamin D, diet and exercise; pt to check with mom re: Gardasil    F/U  Return in about 1 year (around 01/19/2020).   B. , PA-C 01/19/2019 9:25 AM

## 2019-01-19 ENCOUNTER — Other Ambulatory Visit: Payer: Self-pay

## 2019-01-19 ENCOUNTER — Ambulatory Visit (INDEPENDENT_AMBULATORY_CARE_PROVIDER_SITE_OTHER): Payer: Managed Care, Other (non HMO) | Admitting: Obstetrics and Gynecology

## 2019-01-19 ENCOUNTER — Other Ambulatory Visit (HOSPITAL_COMMUNITY)
Admission: RE | Admit: 2019-01-19 | Discharge: 2019-01-19 | Disposition: A | Discharge status: Home / Self Care | Payer: Managed Care, Other (non HMO) | Source: Ambulatory Visit | Attending: Obstetrics and Gynecology | Admitting: Obstetrics and Gynecology

## 2019-01-19 ENCOUNTER — Encounter: Payer: Self-pay | Admitting: Obstetrics and Gynecology

## 2019-01-19 VITALS — BP 110/70 | Ht 64.0 in | Wt 163.2 lb

## 2019-01-19 DIAGNOSIS — Z01419 Encounter for gynecological examination (general) (routine) without abnormal findings: Secondary | ICD-10-CM | POA: Diagnosis not present

## 2019-01-19 DIAGNOSIS — Z113 Encounter for screening for infections with a predominantly sexual mode of transmission: Secondary | ICD-10-CM | POA: Diagnosis present

## 2019-01-19 DIAGNOSIS — Z3009 Encounter for other general counseling and advice on contraception: Secondary | ICD-10-CM

## 2019-01-19 NOTE — Patient Instructions (Signed)
I value your feedback and entrusting us with your care. If you get a [facility] patient survey, I would appreciate you taking the time to let us know about your experience today. Thank you! 

## 2019-01-20 DIAGNOSIS — E559 Vitamin D deficiency, unspecified: Secondary | ICD-10-CM | POA: Insufficient documentation

## 2019-01-20 DIAGNOSIS — F419 Anxiety disorder, unspecified: Secondary | ICD-10-CM | POA: Insufficient documentation

## 2019-01-20 LAB — CERVICOVAGINAL ANCILLARY ONLY
Chlamydia: NEGATIVE
Neisseria Gonorrhea: NEGATIVE

## 2019-01-21 ENCOUNTER — Ambulatory Visit: Payer: Managed Care, Other (non HMO) | Admitting: Podiatry

## 2019-01-21 ENCOUNTER — Encounter: Payer: Self-pay | Admitting: Podiatry

## 2019-01-21 ENCOUNTER — Other Ambulatory Visit: Payer: Self-pay

## 2019-01-21 ENCOUNTER — Ambulatory Visit (INDEPENDENT_AMBULATORY_CARE_PROVIDER_SITE_OTHER): Payer: Managed Care, Other (non HMO)

## 2019-01-21 VITALS — Temp 98.3°F

## 2019-01-21 DIAGNOSIS — M2011 Hallux valgus (acquired), right foot: Secondary | ICD-10-CM | POA: Diagnosis not present

## 2019-01-21 DIAGNOSIS — M2012 Hallux valgus (acquired), left foot: Secondary | ICD-10-CM | POA: Diagnosis not present

## 2019-01-21 DIAGNOSIS — M2141 Flat foot [pes planus] (acquired), right foot: Secondary | ICD-10-CM

## 2019-01-21 DIAGNOSIS — M2142 Flat foot [pes planus] (acquired), left foot: Secondary | ICD-10-CM

## 2019-01-21 NOTE — Progress Notes (Signed)
  Subjective:  Patient ID: Daisy Douglas, female    DOB: 11-Mar-1996,  MRN: [id number] HPI Chief Complaint  Patient presents with  . Foot Pain    Patient presents today for bilat bunions x 2-3 years.  She states "my feet hurt at my arch and goes up into my forfoot and it feels like sharp pins and needles sometimes.  The pain comes and goes but hurts when I walk"  She has only tried diffrent shoes to help    23 y.o. female presents with the above complaint.   ROS: Denies fever chills nausea vomiting muscle aches pains calf pain back pain chest pain shortness of breath.  Past Medical History:  Diagnosis Date  . Dysmenorrhea    Past Surgical History:  Procedure Laterality Date  . NO PAST SURGERIES      Current Outpatient Medications:  .  naproxen (NAPROSYN) 500 MG tablet, Take 500 mg by mouth 2 (two) times daily with a meal., Disp: , Rfl:   No Known Allergies Review of Systems Objective:   Vitals:   01/21/19 0938  Temp: 98.3 F (36.8 C)    General: Well developed, nourished, in no acute distress, alert and oriented x3   Dermatological: Skin is warm, dry and supple bilateral. Nails x 10 are well maintained; remaining integument appears unremarkable at this time. There are no open sores, no preulcerative lesions, no rash or signs of infection present.  Vascular: Dorsalis Pedis artery and Posterior Tibial artery pedal pulses are 2/4 bilateral with immedate capillary fill time. Pedal hair growth present. No varicosities and no lower extremity edema present bilateral.   Neruologic: Grossly intact via light touch bilateral. Vibratory intact via tuning fork bilateral. Protective threshold with Semmes Wienstein monofilament intact to all pedal sites bilateral. Patellar and Achilles deep tendon reflexes 2+ bilateral. No Babinski or clonus noted bilateral.   Musculoskeletal: No gross boney pedal deformities bilateral. No pain, crepitus, or limitation noted with foot and ankle range of  motion bilateral. Muscular strength 5/5 in all groups tested bilateral.  Flexible pes planus is noted bilaterally.  She has tenderness on palpation of the first metatarsal phalangeal joint medially and plantarly.  Mild reactive hyperkeratotic tissue plantarly.  She has very prominent metatarsal heads bilaterally.  Gait: Unassisted, Nonantalgic.    Radiographs:  Radiographs taken today demonstrate an osseously mature individual with pes planus and mild hallux valgus deformity.  An increase in the first intermetatarsal angle and an increase in the hallux abductus angle is noted.  No acute findings are noted.  No coalitions.  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  Plan: Discussed the pros and cons of surgical intervention today.  Discussed appropriate shoe gear stretching exercise ice therapy sugar modifications.  At this point we are going to have her scanned for orthotics.  I will follow-up with her once those have been completed.     Daquavion Catala T. [place], [place]

## 2019-02-11 ENCOUNTER — Ambulatory Visit: Payer: Managed Care, Other (non HMO) | Admitting: Orthotics

## 2019-02-11 ENCOUNTER — Other Ambulatory Visit: Payer: Self-pay

## 2019-02-11 DIAGNOSIS — M2142 Flat foot [pes planus] (acquired), left foot: Secondary | ICD-10-CM

## 2019-02-11 DIAGNOSIS — M2011 Hallux valgus (acquired), right foot: Secondary | ICD-10-CM

## 2019-02-11 DIAGNOSIS — M2141 Flat foot [pes planus] (acquired), right foot: Secondary | ICD-10-CM

## 2019-02-11 DIAGNOSIS — M2012 Hallux valgus (acquired), left foot: Secondary | ICD-10-CM

## 2019-02-11 NOTE — Progress Notes (Signed)
Patient came in today to pick up custom made foot orthotics.  The goals were accomplished and the patient reported no dissatisfaction with said orthotics.  Patient was advised of breakin period and how to report any issues. 

## 2019-03-26 ENCOUNTER — Ambulatory Visit: Payer: Managed Care, Other (non HMO) | Admitting: Obstetrics and Gynecology

## 2019-04-01 NOTE — Patient Instructions (Signed)
I value your feedback and entrusting us with your care. If you get a [facility] patient survey, I would appreciate you taking the time to let us know about your experience today. Thank you! 

## 2019-04-01 NOTE — Progress Notes (Signed)
Patient, [name]   Chief Complaint  Patient presents with  . STD screening    HPI:      Daisy Douglas is a 23 y.o. G0P0000 who LMP was Patient's last menstrual period was 03/21/2019 (approximate)., presents today for STD testing. Has new sex partner, didn't use condom during one encounter. No vag sx, no known exposures. Wants to be safe. Neg STD testing at 01/19/19 annual.   Past Medical History:  Diagnosis Date  . Dysmenorrhea     Past Surgical History:  Procedure Laterality Date  . NO PAST SURGERIES      Family History  Problem Relation Age of Onset  . Diabetes Mother   . Hypertension Mother   . Diabetes Father   . Hypertension Father   . Breast cancer Neg Hx   . Ovarian cancer Neg Hx     Social History   Socioeconomic History  . Marital status: Single    Spouse name: Not on file  . Number of children: Not on file  . Years of education: Not on file  . Highest education level: Not on file  Occupational History  . Not on file  Social Needs  . Financial resource strain: Not on file  . Food insecurity    Worry: Not on file    Inability: Not on file  . Transportation needs    Medical: Not on file    Non-medical: Not on file  Tobacco Use  . Smoking status: Never Smoker  . Smokeless tobacco: Never Used  Substance and Sexual Activity  . Alcohol use: Never    Frequency: Never  . Drug use: Never  . Sexual activity: Not Currently    Partners: Male    Birth control/protection: None  Lifestyle  . Physical activity    Days per week: 0 days    Minutes per session: Not on file  . Stress: Not on file  Relationships  . Social Musicianconnections    Talks on phone: Not on file    Gets together: Not on file    Attends religious service: Not on file    Active member of club or organization: Not on file    Attends meetings of clubs or organizations: Not on file    Relationship status: Not on file  . Intimate partner violence    Fear of current or ex partner:  Not on file    Emotionally abused: Not on file    Physically abused: Not on file    Forced sexual activity: Not on file  Other Topics Concern  . Not on file  Social History Narrative  . Not on file    Outpatient Medications Prior to Visit  Medication Sig Dispense Refill  . naproxen (NAPROSYN) 500 MG tablet Take 500 mg by mouth 2 (two) times daily with a meal.     No facility-administered medications prior to visit.       ROS:  Review of Systems  Constitutional: Negative for fever.  Gastrointestinal: Negative for blood in stool, constipation, diarrhea, nausea and vomiting.  Genitourinary: Negative for dyspareunia, dysuria, flank pain, frequency, hematuria, urgency, vaginal bleeding, vaginal discharge and vaginal pain.  Musculoskeletal: Negative for back pain.  Skin: Negative for rash.   BREAST: No symptoms   OBJECTIVE:   Vitals:  BP 90/60   Ht 5\' 4"  (1.626 m)   Wt 152 lb 9.6 oz (69.2 kg)   LMP 03/21/2019 (Approximate)   BMI 26.19 kg/m   Physical Exam  Vitals signs reviewed.  Constitutional:      Appearance: She is well-developed.  Neck:     Musculoskeletal: Normal range of motion.  Pulmonary:     Effort: Pulmonary effort is normal.  Genitourinary:    General: Normal vulva.     Pubic Area: No rash.      Labia:        Right: No rash, tenderness or lesion.        Left: No rash, tenderness or lesion.      Vagina: Normal. No vaginal discharge, erythema or tenderness.     Cervix: Normal.     Uterus: Normal. Not enlarged and not tender.      Adnexa: Right adnexa normal and left adnexa normal.       Right: No mass or tenderness.         Left: No mass or tenderness.    Musculoskeletal: Normal range of motion.  Skin:    General: Skin is warm and dry.  Neurological:     General: No focal deficit present.     Mental Status: She is alert and oriented to person, place, and time.  Psychiatric:        Mood and Affect: Mood normal.        Behavior: Behavior normal.         Thought Content: Thought content normal.        Judgment: Judgment normal.     Assessment/Plan: Screening for STD (sexually transmitted disease) - Plan: Cervicovaginal ancillary only, STD testing today. Will f/u with results. Condoms.      Return if symptoms worsen or fail to improve.  Alicia B. Copland, PA-C 04/02/2019 9:21 AM

## 2019-04-02 ENCOUNTER — Other Ambulatory Visit (HOSPITAL_COMMUNITY)
Admission: RE | Admit: 2019-04-02 | Discharge: 2019-04-02 | Disposition: A | Discharge status: Home / Self Care | Payer: Managed Care, Other (non HMO) | Source: Ambulatory Visit | Attending: Obstetrics and Gynecology | Admitting: Obstetrics and Gynecology

## 2019-04-02 ENCOUNTER — Other Ambulatory Visit: Payer: Self-pay

## 2019-04-02 ENCOUNTER — Encounter: Payer: Self-pay | Admitting: Obstetrics and Gynecology

## 2019-04-02 ENCOUNTER — Ambulatory Visit (INDEPENDENT_AMBULATORY_CARE_PROVIDER_SITE_OTHER): Payer: Managed Care, Other (non HMO) | Admitting: Obstetrics and Gynecology

## 2019-04-02 VITALS — BP 90/60 | Ht 64.0 in | Wt 152.6 lb

## 2019-04-02 DIAGNOSIS — Z113 Encounter for screening for infections with a predominantly sexual mode of transmission: Secondary | ICD-10-CM | POA: Insufficient documentation

## 2019-04-03 LAB — CERVICOVAGINAL ANCILLARY ONLY
Chlamydia: NEGATIVE
Neisseria Gonorrhea: NEGATIVE
Trichomonas: NEGATIVE

## 2019-04-06 ENCOUNTER — Ambulatory Visit: Payer: Managed Care, Other (non HMO) | Admitting: Obstetrics and Gynecology

## 2019-05-15 ENCOUNTER — Telehealth: Payer: Self-pay

## 2019-05-15 NOTE — Telephone Encounter (Signed)
Pt is calling stating she would like to go back on the [facility] she was previously on. She recently had it taken off her chart but has decided that she does need to be on it. Please advise, Thank you

## 2019-05-16 ENCOUNTER — Other Ambulatory Visit: Payer: Self-pay | Admitting: Obstetrics and Gynecology

## 2019-05-16 MED ORDER — DROSPIRENONE-ETHINYL ESTRADIOL 3-0.02 MG PO TABS: 1.0000 | ORAL_TABLET | Freq: Every day | ORAL | 3 refills | Status: DC

## 2019-05-16 NOTE — Progress Notes (Signed)
Rx yaz to start with menses. Had 7-9 day period with loestrin 1/20. See if menses shorter with yaz.

## 2019-05-16 NOTE — Telephone Encounter (Signed)
Pls let pt know OCP eRxd. Start first [place] of next menses, condoms for 1 mo. Trying different low-dose OCPs to see if can get menses shorter than 7-9 days. May have BTB first 3 packs since restarting pills.

## 2019-05-18 NOTE — Telephone Encounter (Signed)
Pt aware.

## 2019-05-18 NOTE — Telephone Encounter (Signed)
Called pt, no answer, LVMTRC. 

## 2019-12-28 ENCOUNTER — Encounter: Payer: Self-pay | Admitting: Emergency Medicine

## 2019-12-28 ENCOUNTER — Other Ambulatory Visit: Payer: Self-pay

## 2019-12-28 ENCOUNTER — Ambulatory Visit
Admission: EM | Admit: 2019-12-28 | Discharge: 2019-12-28 | Disposition: A | Discharge status: Home / Self Care | Payer: Managed Care, Other (non HMO) | Attending: Family Medicine | Admitting: Family Medicine

## 2019-12-28 DIAGNOSIS — J029 Acute pharyngitis, unspecified: Secondary | ICD-10-CM | POA: Diagnosis not present

## 2019-12-28 DIAGNOSIS — J069 Acute upper respiratory infection, unspecified: Secondary | ICD-10-CM | POA: Diagnosis not present

## 2019-12-28 DIAGNOSIS — Z79899 Other long term (current) drug therapy: Secondary | ICD-10-CM | POA: Insufficient documentation

## 2019-12-28 DIAGNOSIS — Z20822 Contact with and (suspected) exposure to covid-19: Secondary | ICD-10-CM | POA: Insufficient documentation

## 2019-12-28 LAB — GROUP A STREP BY PCR: Group A Strep by PCR: NOT DETECTED

## 2019-12-28 NOTE — ED Triage Notes (Signed)
Pt c/o sore throat, nasal congestion, runny nose. Started 2 days ago. She states she sees white patched on the back of her throat. She was tested through her employer and is pending results.

## 2019-12-28 NOTE — Discharge Instructions (Signed)
Over-the-counter medication as needed.  Rest. Drink plenty of fluids.  ° °Follow up with your primary care physician this week as needed. Return to Urgent care for new or worsening concerns.  ° °

## 2019-12-28 NOTE — ED Provider Notes (Signed)
[facility] ____________________________________________  Time seen: Approximately 7:41 PM  I have reviewed the triage vital signs and the nursing notes.   HISTORY  Chief Complaint Sore Throat   HPI Daisy Douglas is a 24 y.o. female presenting for evaluation of 2 days of sore throat, nasal congestion.  States runny nose.  States that she felt that she saw white spots to the back of her throat today prompting her to come in due to concern of strep throat.  States sore throat is moderate, and feels better after eating.  Denies fevers, changes in taste or smell, chest pain, shortness of breath, vomiting, diarrhea or rash.  Denies known direct sick contacts.  Denies history of seasonal allergies.  Overall continues eat and drink well.  Has not take any over-the-counter medications for same complaints.  Denies other complaints.     Past Medical History:  Diagnosis Date  . Dysmenorrhea     Patient Active Problem List   Diagnosis Date Noted  . Anxiety 01/20/2019  . Vitamin D deficiency 01/20/2019    Past Surgical History:  Procedure Laterality Date  . NO PAST SURGERIES       No current facility-administered medications for this encounter.  Current Outpatient Medications:  .  metroNIDAZOLE (METROGEL) 0.75 % vaginal gel, Place vaginally at bedtime., Disp: , Rfl:  .  drospirenone-ethinyl estradiol (YAZ) 3-0.02 MG tablet, Take 1 tablet by mouth daily., Disp: 3 Package, Rfl: 3  Allergies Patient has no known allergies.  Family History  Problem Relation Age of Onset  . Diabetes Mother   . Hypertension Mother   . Diabetes Father   . Hypertension Father   . Breast cancer Neg Hx   . Ovarian cancer Neg Hx     Social History Social History   Tobacco Use  . Smoking status: Never Smoker  . Smokeless tobacco: Never Used  Substance Use Topics  . Alcohol use: Never  . Drug use: Never    Review of Systems Constitutional: No fever ENT: Positive sore  throat. Cardiovascular: Denies chest pain. Respiratory: Denies shortness of breath. Gastrointestinal: No abdominal pain.  No nausea, no vomiting.  Musculoskeletal: Negative for back pain. Skin: Negative for rash.  ____________________________________________   PHYSICAL EXAM:  VITAL SIGNS: ED Triage Vitals  Enc Vitals Group     BP 12/28/19 1855 112/76     Pulse Rate 12/28/19 1855 72     Resp 12/28/19 1855 18     Temp 12/28/19 1855 98.6 F (37 C)     Temp Source 12/28/19 1855 Oral     SpO2 12/28/19 1855 99 %     Weight 12/28/19 1853 152 lb 8.9 oz (69.2 kg)     Height 12/28/19 1853 5\' 4"  (1.626 m)     Head Circumference --      Peak Flow --      Pain Score 12/28/19 1852 6     Pain Loc --      Pain Edu? --      Excl. in GC? --     Constitutional: Alert and oriented. Well appearing and in no acute distress. Eyes: Conjunctivae are normal.  Head: Atraumatic. No sinus tenderness to palpation. No swelling. No erythema.  Ears: no erythema, normal TMs bilaterally.   Nose:Nasal congestion   Mouth/Throat: Mucous membranes are moist. Mild pharyngeal erythema.  Mild bilateral tonsillar swelling.  No exudate.  No uvular shift or deviation. Neck: No stridor.  No cervical spine tenderness to palpation. Hematological/Lymphatic/Immunilogical: No cervical lymphadenopathy.  Cardiovascular: Normal rate, regular rhythm. Grossly normal heart sounds.  Good peripheral circulation. Respiratory: Normal respiratory effort.  No retractions. No wheezes, rales or rhonchi. Good air movement.  Musculoskeletal: Ambulatory with steady gait.  Neurologic:  Normal speech and language. No gait instability. Skin:  Skin appears warm, dry and intact. No rash noted. Psychiatric: Mood and affect are normal. Speech and behavior are normal.  ___________________________________________   LABS (all labs ordered are listed, but only abnormal results are displayed)  Labs Reviewed  GROUP A STREP BY PCR  SARS  CORONAVIRUS 2 (TAT 6-24 HRS)     PROCEDURES Procedures    INITIAL IMPRESSION / ASSESSMENT AND PLAN / ED COURSE  Pertinent labs & imaging results that were available during my care of the patient were reviewed by me and considered in my medical decision making (see chart for details).  Well-appearing patient.  No acute distress.  Strep negative, will culture.  COVID-19 testing completed advice given.  Suspect viral illness.  Rest, fluids, supportive care, work note given, over-the-counter cough congestion medications as needed, and lozenges.  Discussed follow up and return parameters including no resolution or any worsening concerns. Patient verbalized understanding and agreed to plan.   ____________________________________________   FINAL CLINICAL IMPRESSION(S) / ED DIAGNOSES  Final diagnoses:  Pharyngitis, unspecified etiology  Upper respiratory tract infection, unspecified type     ED Discharge Orders    None       Note: This dictation was prepared with Dragon dictation along with smaller phrase technology. Any transcriptional errors that result from this process are unintentional.         Marylene Land, NP 12/28/19 2029

## 2019-12-29 LAB — SARS CORONAVIRUS 2 (TAT 6-24 HRS): SARS Coronavirus 2: NEGATIVE

## 2020-02-04 ENCOUNTER — Ambulatory Visit: Payer: Managed Care, Other (non HMO) | Admitting: Obstetrics and Gynecology

## 2020-02-04 ENCOUNTER — Other Ambulatory Visit: Payer: Self-pay

## 2020-02-04 ENCOUNTER — Ambulatory Visit (INDEPENDENT_AMBULATORY_CARE_PROVIDER_SITE_OTHER): Payer: Managed Care, Other (non HMO) | Admitting: Obstetrics and Gynecology

## 2020-02-04 ENCOUNTER — Other Ambulatory Visit (HOSPITAL_COMMUNITY)
Admission: RE | Admit: 2020-02-04 | Discharge: 2020-02-04 | Disposition: A | Discharge status: Home / Self Care | Payer: Managed Care, Other (non HMO) | Source: Ambulatory Visit | Attending: Obstetrics and Gynecology | Admitting: Obstetrics and Gynecology

## 2020-02-04 ENCOUNTER — Encounter: Payer: Self-pay | Admitting: Obstetrics and Gynecology

## 2020-02-04 VITALS — BP 114/70 | Ht 64.0 in | Wt 161.0 lb

## 2020-02-04 DIAGNOSIS — Z01419 Encounter for gynecological examination (general) (routine) without abnormal findings: Secondary | ICD-10-CM | POA: Diagnosis not present

## 2020-02-04 DIAGNOSIS — Z124 Encounter for screening for malignant neoplasm of cervix: Secondary | ICD-10-CM | POA: Diagnosis present

## 2020-02-04 DIAGNOSIS — Z3009 Encounter for other general counseling and advice on contraception: Secondary | ICD-10-CM | POA: Diagnosis not present

## 2020-02-04 DIAGNOSIS — Z113 Encounter for screening for infections with a predominantly sexual mode of transmission: Secondary | ICD-10-CM | POA: Diagnosis present

## 2020-02-04 NOTE — Patient Instructions (Signed)
I value your feedback and entrusting us with your care. If you get a [facility] patient survey, I would appreciate you taking the time to let us know about your experience today. Thank you!  As of July 30, 2019, your lab results will be released to your MyChart immediately, before I even have a chance to see them. Please give me time to review them and contact you if there are any abnormalities. Thank you for your patience.  

## 2020-02-04 NOTE — Progress Notes (Signed)
PCP:  Patient, [name]   Chief Complaint  Patient presents with   Gynecologic Exam   STD testing     HPI:      [name] is a 24 y.o. No obstetric history on file. who LMP was Patient's last menstrual period was 01/29/2020 (exact date)., presents today for her annual examination.  Her menses are every 4-5 wks off OCPs (used to be less regular off OCPs), lasting 7 days.  Dysmenorrhea mild, occurring first 1-2 days of flow. She does not have intermenstrual bleeding. Stopped OCPs since last yr.  Sex activity: infrequently sex active now--contraception: condoms. Declines other BC.  Pap: none due to age; due this yr Hx of STDs: none; last checked 8/20  There is no FH of breast cancer. There is no FH of ovarian cancer. The patient does not do self-breast exams.  Tobacco use: The patient denies current or previous tobacco use. Alcohol use: rare No drug use.  Exercise: mod active  She does get adequate calcium and has started Vitamin D supp due to deficiency.   Labs with PCP, has pre-DM.  Unsure about Gard vaccine in past.   Past Medical History:  Diagnosis Date   Dysmenorrhea    Pre-diabetes    Vitamin D deficiency     Past Surgical History:  Procedure Laterality Date   NO PAST SURGERIES      Family History  Problem Relation Age of Onset   Diabetes Mother    Hypertension Mother    Diabetes Father    Hypertension Father    Thyroid disease Sister    Breast cancer Neg Hx    Ovarian cancer Neg Hx     Social History   Socioeconomic History   Marital status: Single    Spouse name: Not on file   Number of children: Not on file   Years of education: Not on file   Highest education level: Not on file  Occupational History   Not on file  Tobacco Use   Smoking status: Never Smoker   Smokeless tobacco: Never Used  Vaping Use   Vaping Use: Never used  Substance and Sexual Activity   Alcohol use: Never   Drug use: Never    Sexual activity: Yes    Partners: Male    Birth control/protection: None  Other Topics Concern   Not on file  Social History Narrative   Not on file   Social Determinants of Health   Financial Resource Strain:    Difficulty of Paying Living Expenses:   Food Insecurity:    Worried About Programme researcher, broadcasting/film/video in the Last Year:    Barista in the Last Year:   Transportation Needs:    Freight forwarder (Medical):    Lack of Transportation (Non-Medical):   Physical Activity:    Days of Exercise per Week:    Minutes of Exercise per Session:   Stress:    Feeling of Stress :   Social Connections:    Frequency of Communication with Friends and Family:    Frequency of Social Gatherings with Friends and Family:    Attends Religious Services:    Active Member of Clubs or Organizations:    Attends Banker Meetings:    Marital Status:   Intimate Partner Violence:    Fear of Current or Ex-Partner:    Emotionally Abused:    Physically Abused:    Sexually Abused:     Outpatient Medications  Prior to Visit  Medication Sig Dispense Refill   ergocalciferol (VITAMIN D2) 1.25 MG (50000 UT) capsule Take by mouth.     escitalopram (LEXAPRO) 10 MG tablet Take by mouth.     drospirenone-ethinyl estradiol (YAZ) 3-0.02 MG tablet Take 1 tablet by mouth daily. 3 Package 3   metroNIDAZOLE (METROGEL) 0.75 % vaginal gel Place vaginally at bedtime.     No facility-administered medications prior to visit.      ROS:  Review of Systems  Constitutional: Positive for fatigue. Negative for fever and unexpected weight change.  Respiratory: Negative for cough, shortness of breath and wheezing.   Cardiovascular: Negative for chest pain, palpitations and leg swelling.  Gastrointestinal: Negative for blood in stool, constipation, diarrhea, nausea and vomiting.  Endocrine: Negative for cold intolerance, heat intolerance and polyuria.  Genitourinary: Negative  for dyspareunia, dysuria, flank pain, frequency, genital sores, hematuria, menstrual problem, pelvic pain, urgency, vaginal bleeding, vaginal discharge and vaginal pain.  Musculoskeletal: Negative for back pain, joint swelling and myalgias.  Skin: Negative for rash.  Neurological: Negative for dizziness, syncope, light-headedness, numbness and headaches.  Hematological: Negative for adenopathy.  Psychiatric/Behavioral: Positive for agitation and dysphoric mood. Negative for confusion, sleep disturbance and suicidal ideas. The patient is not nervous/anxious.   BREAST: No symptoms   Objective: BP 114/70    Ht 5\' 4"  (1.626 m)    Wt 161 lb (73 kg)    LMP 01/29/2020 (Exact Date)    BMI 27.64 kg/m    Physical Exam Constitutional:      Appearance: She is well-developed.  Genitourinary:     Uterus, right adnexa and left adnexa normal.     Vulval lesion present.     No vulval tenderness noted.     Vaginal bleeding present.     No vaginal discharge, erythema or tenderness.     No cervical motion tenderness or polyp.     Uterus is not enlarged or tender.     No right or left adnexal mass present.     Right adnexa not tender.     Left adnexa not tender.  Neck:     Thyroid: No thyromegaly.  Cardiovascular:     Rate and Rhythm: Normal rate and regular rhythm.     Heart sounds: Normal heart sounds. No murmur heard.   Pulmonary:     Effort: Pulmonary effort is normal.     Breath sounds: Normal breath sounds.  Chest:     Breasts:        Right: No mass, nipple discharge, skin change or tenderness.        Left: No mass, nipple discharge, skin change or tenderness.  Abdominal:     Palpations: Abdomen is soft.     Tenderness: There is no abdominal tenderness. There is no guarding.  Musculoskeletal:        General: Normal range of motion.     Cervical back: Normal range of motion.  Neurological:     General: No focal deficit present.     Mental Status: She is alert and oriented to person,  place, and time.     Cranial Nerves: No cranial nerve deficit.  Skin:    General: Skin is warm and dry.  Psychiatric:        Mood and Affect: Mood normal.        Behavior: Behavior normal.        Thought Content: Thought content normal.        Judgment: Judgment normal.  Vitals reviewed.     Assessment/Plan: Encounter for annual routine gynecological examination  Cervical cancer screening - Plan: Cytology - PAP  Screening for STD (sexually transmitted disease) - Plan: Cytology - PAP  Encounter for other general counseling or advice on contraception; declines BC. F/u prn. Condoms in meantime.          GYN counsel STD prevention, adequate intake of calcium and vitamin D, diet and exercise    F/U  Return in about 1 year (around 02/03/2021).  Dameion Briles B. Safiya Girdler, PA-C 02/04/2020 2:25 PM

## 2020-02-08 LAB — CYTOLOGY - PAP
Chlamydia: NEGATIVE
Comment: NEGATIVE
Comment: NORMAL
Diagnosis: NEGATIVE
Neisseria Gonorrhea: NEGATIVE

## 2020-02-24 ENCOUNTER — Other Ambulatory Visit: Payer: Self-pay

## 2020-02-24 ENCOUNTER — Ambulatory Visit
Admission: EM | Admit: 2020-02-24 | Discharge: 2020-02-24 | Disposition: A | Discharge status: Home / Self Care | Payer: Managed Care, Other (non HMO) | Attending: Emergency Medicine | Admitting: Emergency Medicine

## 2020-02-24 DIAGNOSIS — N760 Acute vaginitis: Secondary | ICD-10-CM | POA: Insufficient documentation

## 2020-02-24 DIAGNOSIS — B3731 Acute candidiasis of vulva and vagina: Secondary | ICD-10-CM

## 2020-02-24 DIAGNOSIS — B373 Candidiasis of vulva and vagina: Secondary | ICD-10-CM | POA: Insufficient documentation

## 2020-02-24 DIAGNOSIS — R239 Unspecified skin changes: Secondary | ICD-10-CM | POA: Diagnosis present

## 2020-02-24 DIAGNOSIS — R238 Other skin changes: Secondary | ICD-10-CM

## 2020-02-24 DIAGNOSIS — Z113 Encounter for screening for infections with a predominantly sexual mode of transmission: Secondary | ICD-10-CM | POA: Diagnosis present

## 2020-02-24 DIAGNOSIS — B9689 Other specified bacterial agents as the cause of diseases classified elsewhere: Secondary | ICD-10-CM | POA: Insufficient documentation

## 2020-02-24 LAB — WET PREP, GENITAL
Sperm: NONE SEEN
Trich, Wet Prep: NONE SEEN

## 2020-02-24 LAB — CHLAMYDIA/NGC RT PCR (ARMC ONLY)
Chlamydia Tr: NOT DETECTED
N gonorrhoeae: NOT DETECTED

## 2020-02-24 LAB — HIV ANTIBODY (ROUTINE TESTING W REFLEX): HIV Screen 4th Generation wRfx: NONREACTIVE

## 2020-02-24 MED ORDER — METRONIDAZOLE 500 MG PO TABS: 500.0000 mg | ORAL_TABLET | Freq: Two times a day (BID) | ORAL | 0 refills | Status: DC

## 2020-02-24 MED ORDER — FLUCONAZOLE 150 MG PO TABS: 150.0000 mg | ORAL_TABLET | Freq: Every day | ORAL | 0 refills | Status: DC

## 2020-02-24 NOTE — ED Triage Notes (Signed)
Pt c/o pimple like area to vagina since yesterday

## 2020-02-24 NOTE — ED Provider Notes (Signed)
[facility] ____________________________________________  Time seen: Approximately 8:15 PM  I have reviewed the triage vital signs and the nursing notes.   HISTORY  Chief Complaint Abscess   HPI Daisy Douglas is a 24 y.o. female presenting for evaluation of possible bump in vaginal area.  Patient states she small a small tiny bump and she tried to pop it without any drainage prompted her to come in.  Also requests STD testing as she had unprotected sexual intercourse with an old sexual partner last week.  Denies concerns of pregnancy.  Denies vaginal odor or vaginal pain.  Denies itching.  States did have some vaginal discharge last week, but none now.  Denies abdominal pain, back pain, dysuria.  Reports overall feels well.  Denies other complaints.  States she has regular STD testing and results are negative.  States regularly tested for herpes with negative results as well.  Patient's last menstrual period was 01/29/2020 (exact date). Denies pregnancy.     Past Medical History:  Diagnosis Date   Dysmenorrhea    Pre-diabetes    Vitamin D deficiency     Patient Active Problem List   Diagnosis Date Noted   Anxiety 01/20/2019   Vitamin D deficiency 01/20/2019    Past Surgical History:  Procedure Laterality Date   NO PAST SURGERIES       No current facility-administered medications for this encounter.  Current Outpatient Medications:    ergocalciferol (VITAMIN D2) 1.25 MG (50000 UT) capsule, Take by mouth., Disp: , Rfl:    escitalopram (LEXAPRO) 10 MG tablet, Take by mouth., Disp: , Rfl:    fluconazole (DIFLUCAN) 150 MG tablet, Take 1 tablet (150 mg total) by mouth daily. Take one pill orally, then Repeat in one week as needed., Disp: 2 tablet, Rfl: 0   metroNIDAZOLE (FLAGYL) 500 MG tablet, Take 1 tablet (500 mg total) by mouth 2 (two) times daily., Disp: 14 tablet, Rfl: 0  Allergies Patient has no known allergies.  Family History  Problem  Relation Age of Onset   Diabetes Mother    Hypertension Mother    Diabetes Father    Hypertension Father    Thyroid disease Sister    Breast cancer Neg Hx    Ovarian cancer Neg Hx     Social History Social History   Tobacco Use   Smoking status: Never Smoker   Smokeless tobacco: Never Used  Building services engineer Use: Never used  Substance Use Topics   Alcohol use: Never   Drug use: Never    Review of Systems Constitutional: No fever ENT: No sore throat. Cardiovascular: Denies chest pain. Respiratory: Denies shortness of breath. Gastrointestinal: No abdominal pain.  No nausea, no vomiting.  No diarrhea.   Genitourinary: Negative for dysuria. As above.  Musculoskeletal: Negative for back pain. Skin: As above.   ____________________________________________   PHYSICAL EXAM:  VITAL SIGNS: ED Triage Vitals  Enc Vitals Group     BP 02/24/20 1956 121/75     Pulse Rate 02/24/20 1956 64     Resp 02/24/20 1956 16     Temp 02/24/20 1956 97.8 F (36.6 C)     Temp src --      SpO2 02/24/20 1956 100 %     Weight --      Height --      Head Circumference --      Peak Flow --      Pain Score 02/24/20 1957 0     Pain Loc --  Pain Edu? --      Excl. in GC? --     Constitutional: Alert and oriented. Well appearing and in no acute distress. Eyes: Conjunctivae are normal.  ENT      Head: Normocephalic and atraumatic. Cardiovascular: Normal rate, regular rhythm. Grossly normal heart sounds.  Good peripheral circulation. Respiratory: Normal respiratory effort without tachypnea nor retractions. Breath sounds are clear and equal bilaterally. No wheezes, rales, rhonchi. Gastrointestinal: Soft and nontender.  No CVA tenderness. Pelvic: Exam completed with Melissa CMA at bedside as chaperone. External: Mild whitish discharge present in vaginal opening.  Left upper external vulva approximately 2 mm mildly erythematous papule, nontender, no drainage, no surrounding  erythema, no edema. Musculoskeletal: Steady gait. Neurologic:  Normal speech and language. Speech is normal. No gait instability.  Skin:  Skin is warm, dry and intact. No rash noted. Psychiatric: Mood and affect are normal. Speech and behavior are normal. Patient exhibits appropriate insight and judgment   ___________________________________________   LABS (all labs ordered are listed, but only abnormal results are displayed)  Labs Reviewed  WET PREP, GENITAL - Abnormal; Notable for the following components:      Result Value   Yeast Wet Prep HPF POC PRESENT (*)    Clue Cells Wet Prep HPF POC PRESENT (*)    WBC, Wet Prep HPF POC FEW (*)    All other components within normal limits  CHLAMYDIA/NGC RT PCR ([facility] ONLY)  HSV(HERPES SIMPLEX VRS) I + II AB-IGM  HSV(HERPES SIMPLEX VRS) I + II AB-IGG  RPR  HIV ANTIBODY (ROUTINE TESTING W REFLEX)     PROCEDURES Procedures    INITIAL IMPRESSION / ASSESSMENT AND PLAN / ED COURSE  Pertinent labs & imaging results that were available during my care of the patient were reviewed by me and considered in my medical decision making (see chart for details).  Well-appearing patient.  No acute distress.  Patient expressed concern of STDs in relation to small red area.  States the area is red as she tried to "squeeze "the area.  Does not appear to be infectious, but irritated.  Patient request STD testing as above, HSV, RPR, HIV, gonorrhea chlamydia and wet prep performed.  A prep positive for yeast and bacterial vaginosis, will treat with Flagyl and Diflucan.  Encourage pelvic rest, safe sex practices when resuming, monitoring.  Avoidance of irritating skin and monitoring, encouraged follow-up with health department if skin change persist as may need biopsy further testing.  Patient agreed to plan.Discussed indication, risks and benefits of medications with patient.   Discussed follow up with Primary care physician this week. Discussed follow  up and return parameters including no resolution or any worsening concerns. Patient verbalized understanding and agreed to plan.   ____________________________________________   FINAL CLINICAL IMPRESSION(S) / ED DIAGNOSES  Final diagnoses:  Bacterial vaginosis  Yeast vaginitis  Screen for STD (sexually transmitted disease)  Other skin changes     ED Discharge Orders         Ordered    metroNIDAZOLE (FLAGYL) 500 MG tablet  2 times daily     Discontinue  Reprint     02/24/20 2038    fluconazole (DIFLUCAN) 150 MG tablet  Daily     Discontinue  Reprint     02/24/20 2038           Note: This dictation was prepared with Dragon dictation along with smaller phrase technology. Any transcriptional errors that result from this process are unintentional.  Renford Dills, NP 02/24/20 2057

## 2020-02-24 NOTE — Discharge Instructions (Addendum)
Take medication as prescribed. Rest. Drink plenty of fluids.  States sex practices.  No sex this week.  Monitor area as discussed.  Follow up with your primary care physician or health department.  Return to Urgent care for new or worsening concerns.

## 2020-02-25 LAB — RPR
RPR Ser Ql: REACTIVE — AB
RPR Titer: 1:1 {titer}

## 2020-02-26 LAB — T.PALLIDUM AB, TOTAL: T Pallidum Abs: NONREACTIVE

## 2020-02-26 LAB — HSV(HERPES SIMPLEX VRS) I + II AB-IGG
HSV 1 Glycoprotein G Ab, IgG: <0.91 index (ref 0.00–0.90)
HSV 2 Glycoprotein G Ab, IgG: <0.91 index (ref 0.00–0.90)

## 2020-02-29 NOTE — Progress Notes (Signed)
Patient, [name]   Chief Complaint  Patient presents with  . STD testing  . Vaginal Bump    small, red, no pain x 4 days    HPI:      [name] is a 24 y.o. G0P0000 whose LMP was Patient's last menstrual period was 03/01/2020 (exact date)., presents today for a small vaginal bump for a few days. Not painful, pt noticed on self exam. Went to ED due to concern (our office was closed). Neg STD testing in ED 02/24/20. 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 No hx of HSV lesions in past, no sx now.  Hx of BV and yeast in ED 02/24/20, treated with flagyl and diflucan. Sx resolved with tx.  Past Medical History:  Diagnosis Date  . Dysmenorrhea   . Pre-diabetes   . Vitamin D deficiency     Past Surgical History:  Procedure Laterality Date  . NO PAST SURGERIES      Family History  Problem Relation Age of Onset  . Diabetes Mother   . Hypertension Mother   . Diabetes Father   . Hypertension Father   . Thyroid disease Sister   . Breast cancer Neg Hx   . Ovarian cancer Neg Hx     Social History   Socioeconomic History  . Marital status: Single    Spouse name: Not on file  . Number of children: Not on file  . Years of education: Not on file  . Highest education level: Not on file  Occupational History  . Not on file  Tobacco Use  . Smoking status: Never Smoker  . Smokeless tobacco: Never Used  Vaping Use  . Vaping Use: Never used  Substance and Sexual Activity  . Alcohol use: Never  . Drug use: Never  . Sexual activity: Yes    Partners: Male    Birth control/protection: None  Other Topics Concern  . Not on file  Social History Narrative  . Not on file   Social Determinants of Health   Financial Resource Strain:   . Difficulty of Paying Living Expenses:   Food Insecurity:   . Worried About Programme researcher, broadcasting/film/video in the Last Year:   . Barista in the Last Year:     Transportation Needs:   . Freight forwarder (Medical):   Marland Kitchen Lack of Transportation (Non-Medical):   Physical Activity:   . Days of Exercise per Week:   . Minutes of Exercise per Session:   Stress:   . Feeling of Stress :   Social Connections:   . Frequency of Communication with Friends and Family:   . Frequency of Social Gatherings with Friends and Family:   . Attends Religious Services:   . Active Member of Clubs or Organizations:   . Attends Banker Meetings:   Marland Kitchen Marital Status:   Intimate Partner Violence:   . Fear of Current or Ex-Partner:   . Emotionally Abused:   Marland Kitchen Physically Abused:   . Sexually Abused:     Outpatient Medications Prior to Visit  Medication Sig Dispense Refill  . ergocalciferol (VITAMIN D2) 1.25 MG (50000 UT) capsule Take by mouth.    . escitalopram (LEXAPRO) 10 MG tablet Take by mouth.    . fluconazole (DIFLUCAN) 150 MG tablet Take 1 tablet (150 mg total) by mouth daily. Take one  pill orally, then Repeat in one week as needed. 2 tablet 0  . metroNIDAZOLE (FLAGYL) 500 MG tablet Take 1 tablet (500 mg total) by mouth 2 (two) times daily. 14 tablet 0   No facility-administered medications prior to visit.      ROS:  Review of Systems  Constitutional: Negative for fever.  Gastrointestinal: Negative for blood in stool, constipation, diarrhea, nausea and vomiting.  Genitourinary: Positive for genital sores. Negative for dyspareunia, dysuria, flank pain, frequency, hematuria, urgency, vaginal bleeding, vaginal discharge and vaginal pain.  Musculoskeletal: Negative for back pain.  Skin: Negative for rash.    OBJECTIVE:   Vitals:  BP 100/70   Ht 5' 4.25" (1.632 m)   Wt 160 lb (72.6 kg)   LMP 03/01/2020 (Exact Date)   BMI 27.25 kg/m   Physical Exam Vitals reviewed.  Constitutional:      Appearance: She is well-developed.  Pulmonary:     Effort: Pulmonary effort is normal.  Genitourinary:    Pubic Area: No rash.      Labia:         Right: No rash, tenderness or lesion.        Left: Lesion present. No rash or tenderness.     Musculoskeletal:        General: Normal range of motion.     Cervical back: Normal range of motion.  Skin:    General: Skin is warm and dry.  Neurological:     General: No focal deficit present.     Mental Status: She is alert and oriented to person, place, and time.  Psychiatric:        Mood and Affect: Mood normal.        Behavior: Behavior normal.        Thought Content: Thought content normal.        Judgment: Judgment normal.     Assessment/Plan: Vaginal lesion--Pinpoint, LT labia minora. Looks like skin tag. Reassurance.   Screening for STD (sexually transmitted disease) - Plan: HSV 2 antibody, IgG, Discussed lab results with O[facility] at [facility]. Some African-American pts will have false positive RPR with neg TPA. Most likely the case for this pt since has happened twice. No need to recheck currently. Also discussed HSV 1/11 combo IgM. Will check HSV 2 IgG in 1 mo and f/u with pt. Reassurance for now.  Pt's questions answered.      Return if symptoms worsen or fail to improve.  Miasia Crabtree B. Everardo Voris, PA-C 03/01/2020 9:00 PM

## 2020-02-29 NOTE — Patient Instructions (Signed)
I value your feedback and entrusting us with your care. If you get a [facility] patient survey, I would appreciate you taking the time to let us know about your experience today. Thank you!  As of July 30, 2019, your lab results will be released to your MyChart immediately, before I even have a chance to see them. Please give me time to review them and contact you if there are any abnormalities. Thank you for your patience.  

## 2020-03-01 ENCOUNTER — Encounter: Payer: Self-pay | Admitting: Obstetrics and Gynecology

## 2020-03-01 ENCOUNTER — Telehealth: Payer: Self-pay

## 2020-03-01 ENCOUNTER — Ambulatory Visit (INDEPENDENT_AMBULATORY_CARE_PROVIDER_SITE_OTHER): Payer: Managed Care, Other (non HMO) | Admitting: Obstetrics and Gynecology

## 2020-03-01 ENCOUNTER — Other Ambulatory Visit: Payer: Self-pay

## 2020-03-01 ENCOUNTER — Ambulatory Visit: Payer: Managed Care, Other (non HMO) | Admitting: Obstetrics and Gynecology

## 2020-03-01 VITALS — BP 100/70 | Ht 64.25 in | Wt 160.0 lb

## 2020-03-01 DIAGNOSIS — N898 Other specified noninflammatory disorders of vagina: Secondary | ICD-10-CM | POA: Diagnosis not present

## 2020-03-01 DIAGNOSIS — Z113 Encounter for screening for infections with a predominantly sexual mode of transmission: Secondary | ICD-10-CM

## 2020-03-01 DIAGNOSIS — R768 Other specified abnormal immunological findings in serum: Secondary | ICD-10-CM | POA: Diagnosis not present

## 2020-03-01 LAB — HSV(HERPES SIMPLEX VRS) I + II AB-IGM: HSVI/II Comb IgM: 0.95 Ratio — ABNORMAL HIGH (ref 0.00–0.90)

## 2020-03-01 NOTE — Telephone Encounter (Signed)
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 Explained results and will cont plan to check HSV 2 IgG in 1 mo. No hx of or current ulcerative lesions. Could also be false positive. Pt understands.

## 2020-03-01 NOTE — Telephone Encounter (Signed)
Patient had apt w/[facility] today. Requesting call back from [facility]. It's urgent to her. She wants to tell her some new (inaudible) and would like to know how you feel about it. She's still a little taken back. She doesn't really trust anyone but [facility]. [phone]

## 2020-03-07 ENCOUNTER — Ambulatory Visit (INDEPENDENT_AMBULATORY_CARE_PROVIDER_SITE_OTHER): Payer: Managed Care, Other (non HMO) | Admitting: Obstetrics and Gynecology

## 2020-03-07 ENCOUNTER — Encounter: Payer: Self-pay | Admitting: Obstetrics and Gynecology

## 2020-03-07 ENCOUNTER — Other Ambulatory Visit: Payer: Self-pay

## 2020-03-07 ENCOUNTER — Encounter: Payer: Managed Care, Other (non HMO) | Attending: Student | Admitting: Dietician

## 2020-03-07 ENCOUNTER — Encounter: Payer: Self-pay | Admitting: Dietician

## 2020-03-07 VITALS — Ht 64.25 in | Wt 163.0 lb

## 2020-03-07 VITALS — BP 120/70 | Ht 64.0 in | Wt 166.0 lb

## 2020-03-07 DIAGNOSIS — N898 Other specified noninflammatory disorders of vagina: Secondary | ICD-10-CM | POA: Diagnosis not present

## 2020-03-07 DIAGNOSIS — R7303 Prediabetes: Secondary | ICD-10-CM | POA: Diagnosis not present

## 2020-03-07 DIAGNOSIS — L309 Dermatitis, unspecified: Secondary | ICD-10-CM

## 2020-03-07 NOTE — Patient Instructions (Addendum)
   Drink 8 cups/64 oz of water a day  Measure out carb servings to better understand portions   2-3 servings of carbs at meals   1-2 servings of carbs at snacks   Try to get better sleep

## 2020-03-07 NOTE — Patient Instructions (Signed)
I value your feedback and entrusting us with your care. If you get a  patient survey, I would appreciate you taking the time to let us know about your experience today. Thank you!  As of July 30, 2019, your lab results will be released to your MyChart immediately, before I even have a chance to see them. Please give me time to review them and contact you if there are any abnormalities. Thank you for your patience.  

## 2020-03-07 NOTE — Progress Notes (Signed)
Patient, [name]   Chief Complaint  Patient presents with  . Follow-up    new lesions, bumps    HPI:      [name] is a 24 y.o. G0P0000 whose LMP was Patient's last menstrual period was 03/01/2020 (exact date)., presents today for new vaginal lesions. Noticed a pimple with purulent d/c a few days ago. Also has 4 small bumps under the skin RT vaginal area. Uses shea butter moisturizer after washing since skin feels dry. Has been doing for years. Just changed to dove sens skin soap. Doesn't wear underwear but wears pants daily at work. Also has a raw area RT side that burns. Sx for a day or so. Had period last wk and wears pads. Concerned about HSV outbreak. Went to ED last wk for vaginal bump and had neg HSV 2 IgG but an equivocal HSV 1/2 IgM combo that pt is concerned about. Discussed results last wk and pt due for repeat HSV 2 IgG in 3 wks.   SEE 03/01/20 OFFICE NOTE   Past Medical History:  Diagnosis Date  . Dysmenorrhea   . Pre-diabetes   . Vitamin D deficiency     Past Surgical History:  Procedure Laterality Date  . NO PAST SURGERIES      Family History  Problem Relation Age of Onset  . Diabetes Mother   . Hypertension Mother   . Diabetes Father   . Hypertension Father   . Thyroid disease Sister   . Breast cancer Neg Hx   . Ovarian cancer Neg Hx     Social History   Socioeconomic History  . Marital status: Single    Spouse name: Not on file  . Number of children: Not on file  . Years of education: Not on file  . Highest education level: Not on file  Occupational History  . Not on file  Tobacco Use  . Smoking status: Never Smoker  . Smokeless tobacco: Never Used  Vaping Use  . Vaping Use: Never used  Substance and Sexual Activity  . Alcohol use: Never  . Drug use: Never  . Sexual activity: Not Currently    Partners: Male    Birth control/protection: None  Other Topics Concern  . Not on file  Social History Narrative  . Not on file    Social Determinants of Health   Financial Resource Strain:   . Difficulty of Paying Living Expenses:   Food Insecurity:   . Worried About Programme researcher, broadcasting/film/video in the Last Year:   . Barista in the Last Year:   Transportation Needs:   . Freight forwarder (Medical):   Marland Kitchen Lack of Transportation (Non-Medical):   Physical Activity:   . Days of Exercise per Week:   . Minutes of Exercise per Session:   Stress:   . Feeling of Stress :   Social Connections:   . Frequency of Communication with Friends and Family:   . Frequency of Social Gatherings with Friends and Family:   . Attends Religious Services:   . Active Member of Clubs or Organizations:   . Attends Banker Meetings:   Marland Kitchen Marital Status:   Intimate Partner Violence:   . Fear of Current or Ex-Partner:   . Emotionally Abused:   Marland Kitchen Physically Abused:   . Sexually Abused:     Outpatient Medications Prior to Visit  Medication Sig Dispense Refill  . ergocalciferol (VITAMIN D2) 1.25 MG (50000 UT)  capsule Take by mouth.    . escitalopram (LEXAPRO) 10 MG tablet Take by mouth.    . fluconazole (DIFLUCAN) 150 MG tablet Take 1 tablet (150 mg total) by mouth daily. Take one pill orally, then Repeat in one week as needed. 2 tablet 0  . metroNIDAZOLE (FLAGYL) 500 MG tablet Take 1 tablet (500 mg total) by mouth 2 (two) times daily. 14 tablet 0   No facility-administered medications prior to visit.     ROS:  Review of Systems  Constitutional: Positive for fatigue. Negative for fever.  Gastrointestinal: Negative for blood in stool, constipation, diarrhea, nausea and vomiting.  Genitourinary: Negative for dyspareunia, dysuria, flank pain, frequency, hematuria, urgency, vaginal bleeding, vaginal discharge and vaginal pain.  Musculoskeletal: Negative for back pain.  Skin: Positive for rash.     OBJECTIVE:   Vitals:  BP 120/70   Ht 5\' 4"  (1.626 m)   Wt 166 lb (75.3 kg)   LMP 03/01/2020 (Exact Date)   BMI  28.49 kg/m   Physical Exam Vitals reviewed.  Constitutional:      Appearance: She is well-developed.  Pulmonary:     Effort: Pulmonary effort is normal.  Genitourinary:    Labia:        Right: Rash and lesion present. No tenderness.        Left: Rash present. No tenderness or lesion.     Musculoskeletal:        General: Normal range of motion.     Cervical back: Normal range of motion.  Skin:    General: Skin is warm and dry.  Neurological:     General: No focal deficit present.     Mental Status: She is alert and oriented to person, place, and time.     Cranial Nerves: No cranial nerve deficit.  Psychiatric:        Mood and Affect: Mood normal.        Behavior: Behavior normal.        Thought Content: Thought content normal.        Judgment: Judgment normal.     Assessment/Plan: Vaginal lesion--c/w pimples RT labia majora. Question clogged due to shea butter. Change to coconut oil prn. Reassurance. F/u prn.   Dermatitis--bilat perineal area; skin looks chaffed. Question related to pad use vs pants rubbing area. Start wearing underwear when at work/wearing pads. Dove sens skin soap, no dryer sheets. Reassurance. F/u prn.     Return if symptoms worsen or fail to improve.  Lizzett Nobile B. Markanthony Gedney, PA-C 03/07/2020 10:49 AM

## 2020-03-07 NOTE — Progress Notes (Signed)
Medical Nutrition Therapy: Visit start time: 1100  end time: 1215 Assessment:  Diagnosis: prediabetes  Past medical history: none Psychosocial issues/ stress concerns: pt reports stress as "high" and feels "not so well" about stress management  Preferred learning method:  . Auditory . Visual . Hands-on  Current weight: 160 lbs  Height: 5'4.25" Medications, supplements: reconciled in medical record  Labs- Vitamin D 25OH 11.4(L), Hg A1c 5.8(H) 02/02/2020  Progress and evaluation:   Work day starts 4am-4pm, Editor, commissioning   Pt has made several recent changes to diet   No more eggs, bacon, toast/pancakes/biscuits for breakfast  Decreased juice and soda intake  Decreased take out intake  Averaging 5-6 hours, lately 3-4 hours    Pt reports feeling health overall is "poor"    Physical activity: ADLs currently, used to include 20-30 min cardio and strength training, 3-5x/week  Dietary Intake:  Usual eating pattern includes 2-3 meals and 2-3 snacks per day. Dining out frequency: 0-2 meals per week.  Breakfast (2:30/3am): 3 yogurt cups, granola, chocolate chip cookie; or skip  Snack (8am): string cheese, bag of chips; or skip   Lunch (3/4pm): bag of popcorn, cheese and crackers; carrots and hummus Snack: chicken salad (mayo, mustard, hummus, peppers, celery) on lettuce, cheese and cracker; croutons, bacon bits, eggs, carrots, mushrooms, lettuce with skinny girl dressing Supper: yogurt and granola  Snack (2-3): 1 cookies, celery and PB, popcorn, hummus and carrots, boiled eggs, cheese and fruit cup  Beverages: 126 oz water with splash of gatorade, 16-32 oz chocolate milk  Nutrition Care Education: Basic nutrition: basic food groups, appropriate nutrient balance, appropriate meal and snack schedule, general nutrition guidelines    Weight control: identifying healthy weight, determining reasonable weight loss rate, importance of low sugar and low fat choices, portion control  strategies  Advanced nutrition:  food label reading Prediabetes:  goals for BGs, appropriate meal and snack schedule, appropriate carb intake and balance, healthy carb choices, role of fiber, protein, fat, role of exercise  Other lifestyle changes:  benefits of making changes, stress management   Nutritional Diagnosis:  [place]-3.3 Overweight/obesity As related to inadequate physical activity and history of excess caloric intake.  As evidenced by pt BMI of 27.76, pt report of diet history, poor sleep quality, and poor stress management.  Intervention:  Diet may be low in F/V consumption, averaging 2-3 serving per day mostly due to skipped meals and/or snacks.  Saturated fat appears to be in recommended range.  Added sugar consumption may be high from daily sweets and sugar-sweetened beverage consumption (chocolate milk). Discussion and instruction also noted above.  Established goals for additional change.  Increase fruit and vegetable intake   Lunch and dinner meals seem balanced   Incorporate more F/V at snacks  Do not skip breakfast, include whole fruit at breakfast   Decrease sugar-sweetened beverage consumption   Blend milk and chocolate milk for less added sugar intake  Try lower fat milk like 1% or skim milk  Stress management   Recommend meeting with Pastoral Care, pt given contact number   Increase quality of sleep, at follow up discuss link between sleep and weight management   At follow up, discuss Mindful Eating handout   Increase physical activity   Incrementally reintroduce exercise back into routine   150 minutes per week is recommendation   Education Materials given:  . General diet guidelines for Diabetes . Plate Planner with food lists  . Goals/ instructions  Learner/ who was taught:  .  Patient   Level of understanding: Marland Kitchen Verbalizes/ demonstrates competency  Demonstrated degree of understanding via:   Teach back Learning barriers: . None  Willingness  to learn/ readiness for change: . Eager, change in progress  Monitoring and Evaluation:  Dietary intake, exercise, A1c, Vitamin D, and body weight      follow up: 4-6 weeks

## 2020-03-29 ENCOUNTER — Encounter: Payer: Self-pay | Admitting: Obstetrics and Gynecology

## 2020-03-29 ENCOUNTER — Ambulatory Visit: Payer: Managed Care, Other (non HMO) | Admitting: Obstetrics and Gynecology

## 2020-03-29 ENCOUNTER — Ambulatory Visit (INDEPENDENT_AMBULATORY_CARE_PROVIDER_SITE_OTHER): Payer: Managed Care, Other (non HMO) | Admitting: Obstetrics and Gynecology

## 2020-03-29 ENCOUNTER — Other Ambulatory Visit: Payer: Self-pay

## 2020-03-29 ENCOUNTER — Encounter: Payer: Managed Care, Other (non HMO) | Attending: Student | Admitting: Dietician

## 2020-03-29 VITALS — Ht 64.0 in | Wt 159.5 lb

## 2020-03-29 VITALS — BP 122/74 | Ht 64.0 in | Wt 160.0 lb

## 2020-03-29 DIAGNOSIS — B373 Candidiasis of vulva and vagina: Secondary | ICD-10-CM

## 2020-03-29 DIAGNOSIS — R7303 Prediabetes: Secondary | ICD-10-CM | POA: Diagnosis not present

## 2020-03-29 DIAGNOSIS — Z113 Encounter for screening for infections with a predominantly sexual mode of transmission: Secondary | ICD-10-CM

## 2020-03-29 DIAGNOSIS — B3731 Acute candidiasis of vulva and vagina: Secondary | ICD-10-CM

## 2020-03-29 LAB — POCT WET PREP WITH KOH
Clue Cells Wet Prep HPF POC: NEGATIVE
KOH Prep POC: NEGATIVE
Trichomonas, UA: NEGATIVE
Yeast Wet Prep HPF POC: POSITIVE

## 2020-03-29 MED ORDER — FLUCONAZOLE 150 MG PO TABS: 150.0000 mg | ORAL_TABLET | Freq: Once | ORAL | 0 refills | Status: AC

## 2020-03-29 NOTE — Progress Notes (Signed)
Medical Nutrition Therapy follow up: Visit start time: 1000  end time: 1100  Assessment:  Diagnosis: prediabetes Medical history changes: none Psychosocial issues/ stress concerns: none  Current weight: 159.5 lbs  Height: 5'4.25" Medications, supplement changes: reconciled in medical record  Labs- no changes   Progress and evaluation:  . Pt reports eating 2 times a day, sometimes three times a day . Pt reports avoiding chocolate milk since last visit  . Pt reports some improvements with sleep, 6 hours/night and some 7-hour nights  . Pt reports feeling 'much better' about health overall    Physical activity: 4-5 times a week, 30 min circuit or 1 hour walking   Dietary Intake:  Usual eating pattern includes 2-3 meals and 1 snacks per day. Dining out frequency: 0-2 meals per week.  Breakfast: oatmeal, eggs and bacon, or skips   Lunch: tofu burger, mashed potatoes, corn; fish with spinach; hummus and lettuce, beef patty; baked ziti    Supper: same as lunch  Snack: 1 slice pound cake, 1-2 scoops of ice cream  Beverages: lots of water   Nutrition Care Education: Basic nutrition: appropriate nutrient balance, appropriate meal and snack schedule, general nutrition guidelines    Weight control: reviewed progress since previous visit, determining reasonable weight loss rate, portion control strategies Advanced nutrition:  cooking techniques, food label reading Pre-diabetes:  goals for BGs, appropriate meal and snack schedule, appropriate carb intake and balance, healthy carb choices for lower glycemic response, role of fiber, protein Other lifestyle changes:  Sleep quality   Intervention:  Pt is making progress towards managing prediabetes and weight loss goals.  Discussed foods and recipes to include to increase variety and avoid food fatigue.  Reviewed in detail the links between sleep quality, blood sugar control, and weight management. Discussed main objective now is maintaining the  healthy changes she has made and consistency.   Established goals for additional change.   Increase fruit and vegetable intake   Incorporate vegetables at lunch and dinner   Incorporate more F/V at snacks  Do not skip breakfast, include whole fruit at breakfast    Decrease sugar-sweetened beverage consumption   Switch from regular chocolate milk to homemade  Drink at least 64oz water daily (add crystal light, lemon, or mio as needed)    Stress management   Recommend meeting with Pastoral Care, pt given contact number   Increase quality of sleep, at follow up discuss link between sleep and weight management   At follow up, discuss Mindful Eating handout    Increase physical activity   Incrementally reintroduce exercise back into routine   150 minutes per week is recommendation    Education Materials given:  . Sip Smarter handout  . A1c goal sheet  . Novo nordisk meal planning and card counting  . Sleep hygiene strategies  . Sleep hygiene 101 . Recipes- quinoa, tempeh  . Goals/ instructions  Learner/ who was taught:  . Patient   Level of understanding: Marland Kitchen Verbalizes/ demonstrates competency  Demonstrated degree of understanding via:   Teach back Learning barriers: . None  Willingness to learn/ readiness for change: . Eager, change in progress  Monitoring and Evaluation:  Dietary intake, exercise, A1c, and body weight      follow up: prn

## 2020-03-29 NOTE — Progress Notes (Signed)
Patient, [name]   Chief Complaint  Patient presents with  . Follow-up  . Exposure to STD    HPI:      Ms. Kiaraliz Rafuse is a 24 y.o. G0P0000 whose LMP was Patient's last menstrual period was 03/01/2020 (exact date)., presents today for increased thick, white vag d/c with irritation, no fishy odor. Treated last wk with leftover "crm" for 5 doses (think it was BV tx after further questioning). Still having sx. She also noticed a red area vaginally for the past 2 days, stings a little bit with urination. Washes vagina "vigorously with loofa" every day.  Pt also due for HSV 2 IgG lab to rule out type 2 herpes after lab done at ED. Pt denies any herpes sx in past but is concerned about possible dx.   Past Medical History:  Diagnosis Date  . Dysmenorrhea   . Pre-diabetes   . Vitamin D deficiency     Past Surgical History:  Procedure Laterality Date  . NO PAST SURGERIES      Family History  Problem Relation Age of Onset  . Diabetes Mother   . Hypertension Mother   . Diabetes Father   . Hypertension Father   . Thyroid disease Sister   . Breast cancer Neg Hx   . Ovarian cancer Neg Hx     Social History   Socioeconomic History  . Marital status: Single    Spouse name: Not on file  . Number of children: Not on file  . Years of education: Not on file  . Highest education level: Not on file  Occupational History  . Not on file  Tobacco Use  . Smoking status: Never Smoker  . Smokeless tobacco: Never Used  Vaping Use  . Vaping Use: Never used  Substance and Sexual Activity  . Alcohol use: Never  . Drug use: Never  . Sexual activity: Not Currently    Partners: Male    Birth control/protection: None  Other Topics Concern  . Not on file  Social History Narrative  . Not on file   Social Determinants of Health   Financial Resource Strain:   . Difficulty of Paying Living Expenses:   Food Insecurity:   . Worried About Programme researcher, broadcasting/film/video in the Last Year:   .  Barista in the Last Year:   Transportation Needs:   . Freight forwarder (Medical):   Marland Kitchen Lack of Transportation (Non-Medical):   Physical Activity:   . Days of Exercise per Week:   . Minutes of Exercise per Session:   Stress:   . Feeling of Stress :   Social Connections:   . Frequency of Communication with Friends and Family:   . Frequency of Social Gatherings with Friends and Family:   . Attends Religious Services:   . Active Member of Clubs or Organizations:   . Attends Banker Meetings:   Marland Kitchen Marital Status:   Intimate Partner Violence:   . Fear of Current or Ex-Partner:   . Emotionally Abused:   Marland Kitchen Physically Abused:   . Sexually Abused:     Outpatient Medications Prior to Visit  Medication Sig Dispense Refill  . ergocalciferol (VITAMIN D2) 1.25 MG (50000 UT) capsule Take by mouth.    . escitalopram (LEXAPRO) 10 MG tablet Take by mouth. (Patient not taking: Reported on 03/07/2020)    . metroNIDAZOLE (FLAGYL) 500 MG tablet Take 1 tablet (500 mg total) by mouth 2 (two)  times daily. 14 tablet 0  . fluconazole (DIFLUCAN) 150 MG tablet Take 1 tablet (150 mg total) by mouth daily. Take one pill orally, then Repeat in one week as needed. 2 tablet 0   No facility-administered medications prior to visit.      ROS:  Review of Systems  Constitutional: Negative for fever.  Gastrointestinal: Negative for blood in stool, constipation, diarrhea, nausea and vomiting.  Genitourinary: Positive for vaginal discharge. Negative for dyspareunia, dysuria, flank pain, frequency, hematuria, urgency, vaginal bleeding and vaginal pain.  Musculoskeletal: Negative for back pain.  Skin: Negative for rash.   BREAST: No symptoms   OBJECTIVE:   Vitals:  BP 122/74   Ht 5\' 4"  (1.626 m)   Wt 160 lb (72.6 kg)   LMP 03/01/2020 (Exact Date)   BMI 27.46 kg/m   Physical Exam Vitals reviewed.  Constitutional:      Appearance: She is well-developed.  Pulmonary:     Effort:  Pulmonary effort is normal.  Genitourinary:    General: Normal vulva.     Pubic Area: No rash.      Labia:        Right: No rash, tenderness or lesion.        Left: No rash, tenderness or lesion.      Vagina: Vaginal discharge present. No erythema or tenderness.     Cervix: Normal.     Uterus: Normal. Not enlarged and not tender.      Adnexa: Right adnexa normal and left adnexa normal.       Right: No mass or tenderness.         Left: No mass or tenderness.       Comments: NEG EXT VAG EXAM Musculoskeletal:        General: Normal range of motion.     Cervical back: Normal range of motion.  Skin:    General: Skin is warm and dry.  Neurological:     General: No focal deficit present.     Mental Status: She is alert and oriented to person, place, and time.  Psychiatric:        Mood and Affect: Mood normal.        Behavior: Behavior normal.        Thought Content: Thought content normal.        Judgment: Judgment normal.     Results: Results for orders placed or performed in visit on 03/29/20 (from the past 24 hour(s))  POCT Wet Prep with KOH     Status: Abnormal   Collection Time: 03/29/20  9:35 AM  Result Value Ref Range   Trichomonas, UA Negative    Clue Cells Wet Prep HPF POC neg    Epithelial Wet Prep HPF POC     Yeast Wet Prep HPF POC pos    Bacteria Wet Prep HPF POC     RBC Wet Prep HPF POC     WBC Wet Prep HPF POC     KOH Prep POC Negative Negative     Assessment/Plan: Candidal vaginitis - Plan: fluconazole (DIFLUCAN) 150 MG tablet, POCT Wet Prep with KOH; Pos sx and wet prep. Rx diflucan. F/u prn. Discussed using water or soap and washing gently. Also discussed sx difference between yeast and BV and to f/u prn sx.   Screening for STD (sexually transmitted disease) - Plan: HSV 2 antibody, IgG    Meds ordered this encounter  Medications  . fluconazole (DIFLUCAN) 150 MG tablet    Sig: Take 1  tablet (150 mg total) by mouth once for 1 dose.    Dispense:  1  tablet    Refill:  0    Order Specific Question:   Supervising Provider    Answer:   Nadara Mustard [[id number]]      Return if symptoms worsen or fail to improve.  Jacobi Ryant B. Brion Hedges, PA-C 03/29/2020 9:36 AM

## 2020-03-29 NOTE — Patient Instructions (Addendum)
   Limit chocolate milk to 1 cup a day   Make at home with sugar free syrup   Exercise 4-6 times a week   Keep up the great work

## 2020-03-29 NOTE — Patient Instructions (Signed)
I value your feedback and entrusting us with your care. If you get a [facility] patient survey, I would appreciate you taking the time to let us know about your experience today. Thank you!  As of July 30, 2019, your lab results will be released to your MyChart immediately, before I even have a chance to see them. Please give me time to review them and contact you if there are any abnormalities. Thank you for your patience.  

## 2020-03-30 LAB — HSV 2 ANTIBODY, IGG: HSV 2 IgG, Type Spec: <0.91 index (ref 0.00–0.90)

## 2020-04-04 ENCOUNTER — Ambulatory Visit: Payer: Managed Care, Other (non HMO) | Admitting: Dietician

## 2020-10-03 ENCOUNTER — Other Ambulatory Visit: Payer: Self-pay

## 2020-10-03 ENCOUNTER — Encounter: Payer: Self-pay | Admitting: Obstetrics and Gynecology

## 2020-10-03 ENCOUNTER — Other Ambulatory Visit (HOSPITAL_COMMUNITY)
Admission: RE | Admit: 2020-10-03 | Discharge: 2020-10-03 | Disposition: A | Discharge status: Home / Self Care | Payer: Managed Care, Other (non HMO) | Source: Ambulatory Visit | Attending: Obstetrics and Gynecology | Admitting: Obstetrics and Gynecology

## 2020-10-03 ENCOUNTER — Ambulatory Visit (INDEPENDENT_AMBULATORY_CARE_PROVIDER_SITE_OTHER): Payer: Managed Care, Other (non HMO) | Admitting: Obstetrics and Gynecology

## 2020-10-03 VITALS — BP 130/80 | Ht 64.0 in | Wt 190.0 lb

## 2020-10-03 DIAGNOSIS — Z113 Encounter for screening for infections with a predominantly sexual mode of transmission: Secondary | ICD-10-CM | POA: Insufficient documentation

## 2020-10-03 DIAGNOSIS — N9089 Other specified noninflammatory disorders of vulva and perineum: Secondary | ICD-10-CM

## 2020-10-03 NOTE — Progress Notes (Signed)
Patient, [name]   Chief Complaint  Patient presents with  . Vaginal Lump    Painful x 1 week    HPI:      Ms. Margaretmary Prisk is a 25 y.o. G0P0000 whose LMP was Patient's last menstrual period was 09/21/2020 (exact date)., presents today for vaginal bump for the past wk. Tender with washing; otherwise, not bothersome. No drainage, no change in size. Uses loofah to wash as well as vaseline or baby oil vaginally to keep skin moist. No increased d/c, irritation, odor otherwise. Hx of yeast and BV in past.  Would like full STD testing. Neg labs last summer.  She is sex active, new partner. Using condoms sometimes.    Past Medical History:  Diagnosis Date  . Dysmenorrhea   . Pre-diabetes   . Vitamin D deficiency     Past Surgical History:  Procedure Laterality Date  . NO PAST SURGERIES      Family History  Problem Relation Age of Onset  . Diabetes Mother   . Hypertension Mother   . Diabetes Father   . Hypertension Father   . Thyroid disease Sister   . Breast cancer Neg Hx   . Ovarian cancer Neg Hx     Social History   Socioeconomic History  . Marital status: Single    Spouse name: Not on file  . Number of children: Not on file  . Years of education: Not on file  . Highest education level: Not on file  Occupational History  . Not on file  Tobacco Use  . Smoking status: Never Smoker  . Smokeless tobacco: Never Used  Vaping Use  . Vaping Use: Never used  Substance and Sexual Activity  . Alcohol use: Never  . Drug use: Never  . Sexual activity: Yes    Partners: Male    Birth control/protection: None  Other Topics Concern  . Not on file  Social History Narrative  . Not on file   Social Determinants of Health   Financial Resource Strain: Not on file  Food Insecurity: Not on file  Transportation Needs: Not on file  Physical Activity: Not on file  Stress: Not on file  Social Connections: Not on file  Intimate Partner Violence: Not on file     Outpatient Medications Prior to Visit  Medication Sig Dispense Refill  . ergocalciferol (VITAMIN D2) 1.25 MG (50000 UT) capsule Take by mouth.    . escitalopram (LEXAPRO) 10 MG tablet Take by mouth. (Patient not taking: Reported on 03/07/2020)    . metroNIDAZOLE (FLAGYL) 500 MG tablet Take 1 tablet (500 mg total) by mouth 2 (two) times daily. 14 tablet 0   No facility-administered medications prior to visit.      ROS:  Review of Systems  Constitutional: Negative for fever, malaise/fatigue and weight loss.  Gastrointestinal: Negative for blood in stool, constipation, diarrhea, nausea and vomiting.  Genitourinary: Positive for genital sores. Negative for dyspareunia, dysuria, flank pain, frequency, hematuria, urgency, vaginal bleeding, vaginal discharge and vaginal pain.  Musculoskeletal: Negative for back pain.  Skin: Negative for itching and rash.    OBJECTIVE:   Vitals:  BP 130/80   Ht 5\' 4"  (1.626 m)   Wt 190 lb (86.2 kg)   LMP 09/21/2020 (Exact Date)   BMI 32.61 kg/m   Physical Exam Vitals reviewed.  Constitutional:      Appearance: She is well-developed.  Pulmonary:     Effort: Pulmonary effort is normal.  Genitourinary:  General: Normal vulva.     Pubic Area: No rash.      Labia:        Right: Lesion present. No rash or tenderness.        Left: No rash, tenderness or lesion.      Vagina: Normal. No vaginal discharge, erythema or tenderness.     Cervix: Normal.     Uterus: Normal. Not enlarged and not tender.      Adnexa: Right adnexa normal and left adnexa normal.       Right: No mass or tenderness.         Left: No mass or tenderness.      Musculoskeletal:        General: Normal range of motion.     Cervical back: Normal range of motion.  Skin:    General: Skin is warm and dry.  Neurological:     General: No focal deficit present.     Mental Status: She is alert and oriented to person, place, and time.  Psychiatric:        Mood and Affect: Mood  normal.        Behavior: Behavior normal.        Thought Content: Thought content normal.        Judgment: Judgment normal.    Assessment/Plan: Vulvar lesion--RT inguinal area and RT perineal area. Feel like cystic nodules, either cyst vs papule. Reassurance. Warm compresses, f/u prn. Wash gently (no loofah), use coconut oil prn moisture.  Screening for STD (sexually transmitted disease) - Plan: Cervicovaginal ancillary only, HIV Antibody (routine testing w rflx), RPR, HSV 2 antibody, IgG, Hepatitis C antibody    Return if symptoms worsen or fail to improve.  Feven Alderfer B. Sparrow Sanzo, PA-C 10/03/2020 2:49 PM

## 2020-10-03 NOTE — Patient Instructions (Signed)
I value your feedback and you entrusting us with your care. If you get a [facility] patient survey, I would appreciate you taking the time to let us know about your experience today. Thank you! ? ? ?

## 2020-10-05 ENCOUNTER — Telehealth: Payer: Self-pay | Admitting: Obstetrics and Gynecology

## 2020-10-05 ENCOUNTER — Ambulatory Visit: Payer: Managed Care, Other (non HMO) | Admitting: Podiatry

## 2020-10-05 LAB — CERVICOVAGINAL ANCILLARY ONLY
Chlamydia: NEGATIVE
Comment: NEGATIVE
Comment: NEGATIVE
Comment: NORMAL
Neisseria Gonorrhea: NEGATIVE
Trichomonas: NEGATIVE

## 2020-10-05 LAB — HSV 2 ANTIBODY, IGG: HSV 2 IgG, Type Spec: <0.91 index (ref 0.00–0.90)

## 2020-10-05 LAB — HIV ANTIBODY (ROUTINE TESTING W REFLEX): HIV Screen 4th Generation wRfx: NONREACTIVE

## 2020-10-05 LAB — RPR, QUANT+TP ABS (REFLEX)
Rapid Plasma Reagin, Quant: 1:1 {titer} — ABNORMAL HIGH
T Pallidum Abs: NONREACTIVE

## 2020-10-05 LAB — RPR: RPR Ser Ql: REACTIVE — AB

## 2020-10-05 LAB — HEPATITIS C ANTIBODY: Hep C Virus Ab: <0.1 s/co ratio (ref 0.0–0.9)

## 2020-10-05 NOTE — Telephone Encounter (Signed)
LM with results again, also detailed on results note on labs. All testing neg. F/u prn.

## 2020-10-19 ENCOUNTER — Ambulatory Visit: Payer: Managed Care, Other (non HMO) | Admitting: Podiatry

## 2020-11-02 ENCOUNTER — Ambulatory Visit: Payer: Managed Care, Other (non HMO) | Admitting: Podiatry

## 2020-11-14 ENCOUNTER — Ambulatory Visit: Payer: Managed Care, Other (non HMO) | Admitting: Podiatry

## 2020-11-30 ENCOUNTER — Encounter: Payer: Self-pay | Admitting: Podiatry

## 2020-11-30 ENCOUNTER — Other Ambulatory Visit: Payer: Self-pay

## 2020-11-30 ENCOUNTER — Ambulatory Visit (INDEPENDENT_AMBULATORY_CARE_PROVIDER_SITE_OTHER): Payer: Managed Care, Other (non HMO) | Admitting: Podiatry

## 2020-11-30 ENCOUNTER — Ambulatory Visit: Payer: Managed Care, Other (non HMO)

## 2020-11-30 DIAGNOSIS — M2011 Hallux valgus (acquired), right foot: Secondary | ICD-10-CM

## 2020-11-30 DIAGNOSIS — M2010 Hallux valgus (acquired), unspecified foot: Secondary | ICD-10-CM

## 2020-11-30 DIAGNOSIS — M2142 Flat foot [pes planus] (acquired), left foot: Secondary | ICD-10-CM

## 2020-11-30 DIAGNOSIS — M2141 Flat foot [pes planus] (acquired), right foot: Secondary | ICD-10-CM | POA: Diagnosis not present

## 2020-11-30 NOTE — Progress Notes (Signed)
She presents today for a follow-up of her hallux valgus deformities she states that she needs new orthotics.  Objective: Vital signs are stable alert oriented x3.  Pulses are palpable.  It appears that there is been no change in her skin or deformities.  Assessment: Chronic hallux valgus deformity with history of fasciitis.  Plan: We will get her scanned today for a set of orthotics.

## 2020-12-28 ENCOUNTER — Ambulatory Visit: Payer: Managed Care, Other (non HMO)

## 2020-12-28 ENCOUNTER — Other Ambulatory Visit: Payer: Self-pay

## 2020-12-28 DIAGNOSIS — M2011 Hallux valgus (acquired), right foot: Secondary | ICD-10-CM

## 2020-12-28 DIAGNOSIS — M2141 Flat foot [pes planus] (acquired), right foot: Secondary | ICD-10-CM

## 2020-12-28 DIAGNOSIS — M2142 Flat foot [pes planus] (acquired), left foot: Secondary | ICD-10-CM

## 2021-01-18 DIAGNOSIS — Z6741 Type O blood, Rh negative: Secondary | ICD-10-CM

## 2021-01-18 HISTORY — DX: Type O blood, Rh negative: Z67.41

## 2021-02-07 ENCOUNTER — Ambulatory Visit: Payer: Managed Care, Other (non HMO) | Admitting: Obstetrics and Gynecology

## 2021-02-08 ENCOUNTER — Ambulatory Visit: Payer: Managed Care, Other (non HMO) | Admitting: Obstetrics and Gynecology

## 2021-02-21 ENCOUNTER — Other Ambulatory Visit: Payer: Self-pay

## 2021-02-21 ENCOUNTER — Encounter: Payer: Self-pay | Admitting: Obstetrics and Gynecology

## 2021-02-21 ENCOUNTER — Other Ambulatory Visit (HOSPITAL_COMMUNITY)
Admission: RE | Admit: 2021-02-21 | Discharge: 2021-02-21 | Disposition: A | Discharge status: Home / Self Care | Payer: Managed Care, Other (non HMO) | Source: Ambulatory Visit | Attending: Obstetrics and Gynecology | Admitting: Obstetrics and Gynecology

## 2021-02-21 ENCOUNTER — Ambulatory Visit (INDEPENDENT_AMBULATORY_CARE_PROVIDER_SITE_OTHER): Payer: Managed Care, Other (non HMO) | Admitting: Obstetrics and Gynecology

## 2021-02-21 VITALS — BP 110/70 | Ht 64.0 in | Wt 187.0 lb

## 2021-02-21 DIAGNOSIS — Z01419 Encounter for gynecological examination (general) (routine) without abnormal findings: Secondary | ICD-10-CM

## 2021-02-21 DIAGNOSIS — Z113 Encounter for screening for infections with a predominantly sexual mode of transmission: Secondary | ICD-10-CM | POA: Diagnosis present

## 2021-02-21 DIAGNOSIS — Z3201 Encounter for pregnancy test, result positive: Secondary | ICD-10-CM | POA: Diagnosis not present

## 2021-02-21 DIAGNOSIS — O209 Hemorrhage in early pregnancy, unspecified: Secondary | ICD-10-CM | POA: Diagnosis not present

## 2021-02-21 NOTE — Progress Notes (Signed)
PCP:  Patient, [name] (Inactive)   Chief Complaint  Patient presents with   Gynecologic Exam    POS UPT 2 days ago, no concerns    HPI:      [name] is a 25 y.o. G1P0000 who LMP was Patient's last menstrual period was 01/21/2021 (exact date)., presents today for her annual examination.  Her menses are every 4 wks off OCPs (used to be less regular off OCPs), lasting 5 days.  Dysmenorrhea mild, occurring first 1-2 days of flow. She does not have intermenstrual bleeding. LMP late this cycle and has had 2 positive home UPTs. Has had faint light pink d/c a couple times since yesterday, minimal cramping. Not taking PNVs.  Sex activity: sex active--contraception: none. No new sex partners. Declined BC last yr.   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  Hx of BV and yeast in past. Vaginal lesions from this past year resolved--were usually cystic nodules. No evid of HSV.  There is no FH of breast cancer. There is no FH of ovarian cancer. The patient does not do self-breast exams.  Tobacco use: The patient denies current or previous tobacco use. Alcohol use: rare No drug use.  Exercise: mod active  She does get adequate calcium but not Vitamin D. Hx of Vit D deficiency; was taking supp but not now.   Labs with PCP, has had pre-DM in past.  Unsure about Gard vaccine in past.   Past Medical History:  Diagnosis Date   Dysmenorrhea    Pre-diabetes    Vitamin D deficiency     Past Surgical History:  Procedure Laterality Date   NO PAST SURGERIES      Family History  Problem Relation Age of Onset   Diabetes Mother    Hypertension Mother    Diabetes Father    Hypertension Father    Thyroid disease Sister    Breast cancer Neg Hx    Ovarian cancer Neg Hx     Social History   Socioeconomic History   Marital status: Single    Spouse name: Not on file   Number of  children: Not on file   Years of education: Not on file   Highest education level: Not on file  Occupational History   Not on file  Tobacco Use   Smoking status: Never   Smokeless tobacco: Never  Vaping Use   Vaping Use: Never used  Substance and Sexual Activity   Alcohol use: Never   Drug use: Never   Sexual activity: Yes    Partners: Male    Birth control/protection: None  Other Topics Concern   Not on file  Social History Narrative   Not on file   Social Determinants of Health   Financial Resource Strain: Not on file  Food Insecurity: Not on file  Transportation Needs: Not on file  Physical Activity: Not on file  Stress: Not on file  Social Connections: Not on file  Intimate Partner Violence: Not on file    No outpatient medications prior to visit.   No facility-administered medications prior to visit.      ROS:  Review of Systems  Constitutional:  Positive for fatigue. Negative for fever and unexpected weight change.  Respiratory:  Negative for cough, shortness of breath and wheezing.   Cardiovascular:  Negative for chest pain, palpitations and leg swelling.  Gastrointestinal:  Negative for blood in stool, constipation, diarrhea, nausea and vomiting.  Endocrine: Negative for cold intolerance, heat intolerance and polyuria.  Genitourinary:  Negative for dyspareunia, dysuria, flank pain, frequency, genital sores, hematuria, menstrual problem, pelvic pain, urgency, vaginal bleeding, vaginal discharge and vaginal pain.  Musculoskeletal:  Negative for back pain, joint swelling and myalgias.  Skin:  Negative for rash.  Neurological:  Negative for dizziness, syncope, light-headedness, numbness and headaches.  Hematological:  Negative for adenopathy.  Psychiatric/Behavioral:  Negative for agitation, confusion, dysphoric mood, sleep disturbance and suicidal ideas. The patient is not nervous/anxious.  BREAST: No symptoms   Objective: BP 110/70   Ht 5\' 4"  (1.626 m)    Wt 187 lb (84.8 kg)   LMP 01/21/2021 (Exact Date)   BMI 32.10 kg/m    Physical Exam Constitutional:      Appearance: She is well-developed.  Genitourinary:     Vulva normal.     Right Labia: No rash, tenderness or lesions.    Left Labia: No tenderness, lesions or rash.    No vaginal discharge, erythema or tenderness.      Right Adnexa: not tender and no mass present.    Left Adnexa: not tender and no mass present.    No cervical friability or polyp.     Uterus is not enlarged or tender.  Breasts:    Right: No mass, nipple discharge, skin change or tenderness.     Left: No mass, nipple discharge, skin change or tenderness.  Neck:     Thyroid: No thyromegaly.  Cardiovascular:     Rate and Rhythm: Normal rate and regular rhythm.     Heart sounds: Normal heart sounds. No murmur heard. Pulmonary:     Effort: Pulmonary effort is normal.     Breath sounds: Normal breath sounds.  Abdominal:     Palpations: Abdomen is soft.     Tenderness: There is no abdominal tenderness. There is no guarding or rebound.  Musculoskeletal:        General: Normal range of motion.     Cervical back: Normal range of motion.  Lymphadenopathy:     Cervical: No cervical adenopathy.  Neurological:     General: No focal deficit present.     Mental Status: She is alert and oriented to person, place, and time.     Cranial Nerves: No cranial nerve deficit.  Skin:    General: Skin is warm and dry.  Psychiatric:        Mood and Affect: Mood normal.        Behavior: Behavior normal.        Thought Content: Thought content normal.        Judgment: Judgment normal.  Vitals reviewed.    Assessment/Plan: Encounter for annual routine gynecological examination  Screening for STD (sexually transmitted disease) - Plan: Cervicovaginal ancillary only  Positive pregnancy test - Plan: Beta hCG quant (ref lab), ABO/Rh; check serum beta today as well as AB/Rh due to scant bleeding past 2 days. Will f/u with  results. Add PNVs, sched NOB. F/u prn heavy bleeding or pelvic pain/cramping.   Bleeding in early pregnancy          GYN counsel adequate intake of calcium and vitamin D, diet and exercise    F/U  Return in about 3 weeks (around 03/14/2021) for NOB.  Kella Splinter B. Sherian Valenza, PA-C 02/21/2021 2:09 PM

## 2021-02-21 NOTE — Patient Instructions (Signed)
I value your feedback and you entrusting us with your care. If you get a [facility] patient survey, I would appreciate you taking the time to let us know about your experience today. Thank you! ? ? ?

## 2021-02-22 ENCOUNTER — Encounter: Payer: Self-pay | Admitting: Obstetrics and Gynecology

## 2021-02-22 LAB — ABO/RH: Rh Factor: NEGATIVE

## 2021-02-22 LAB — BETA HCG QUANT (REF LAB): hCG Quant: 190 m[IU]/mL

## 2021-02-23 LAB — CERVICOVAGINAL ANCILLARY ONLY
Chlamydia: NEGATIVE
Comment: NEGATIVE
Comment: NORMAL
Neisseria Gonorrhea: NEGATIVE

## 2021-02-24 ENCOUNTER — Encounter: Payer: Self-pay | Admitting: Obstetrics and Gynecology

## 2021-03-14 ENCOUNTER — Other Ambulatory Visit: Payer: Self-pay

## 2021-03-14 ENCOUNTER — Encounter: Payer: Self-pay | Admitting: Obstetrics

## 2021-03-14 ENCOUNTER — Ambulatory Visit (INDEPENDENT_AMBULATORY_CARE_PROVIDER_SITE_OTHER): Payer: Managed Care, Other (non HMO) | Admitting: Obstetrics

## 2021-03-14 ENCOUNTER — Other Ambulatory Visit (HOSPITAL_COMMUNITY)
Admission: RE | Admit: 2021-03-14 | Discharge: 2021-03-14 | Disposition: A | Discharge status: Home / Self Care | Payer: Managed Care, Other (non HMO) | Source: Ambulatory Visit | Attending: Obstetrics | Admitting: Obstetrics

## 2021-03-14 VITALS — BP 122/70 | Ht 64.0 in | Wt 192.8 lb

## 2021-03-14 DIAGNOSIS — Z113 Encounter for screening for infections with a predominantly sexual mode of transmission: Secondary | ICD-10-CM | POA: Diagnosis present

## 2021-03-14 DIAGNOSIS — Z3480 Encounter for supervision of other normal pregnancy, unspecified trimester: Secondary | ICD-10-CM

## 2021-03-14 DIAGNOSIS — N926 Irregular menstruation, unspecified: Secondary | ICD-10-CM

## 2021-03-14 DIAGNOSIS — Z124 Encounter for screening for malignant neoplasm of cervix: Secondary | ICD-10-CM

## 2021-03-14 DIAGNOSIS — Z3A01 Less than 8 weeks gestation of pregnancy: Secondary | ICD-10-CM

## 2021-03-14 NOTE — Progress Notes (Signed)
New Obstetric Patient H&P    Chief Complaint: "Desires prenatal care"   History of Present Illness: Patient is a 25 y.o. G1P0000 Not Hispanic or Latino female, LMP 01/21/2021 presents with amenorrhea and positive home pregnancy test. Based on her  LMP, her EDD is Estimated Date of Delivery: 10/28/21 and her EGA is [redacted]w[redacted]d. Cycles are 5. days, regular, and occur approximately every : 28 days. Her last pap smear was about 1 years ago and was no abnormalities.    She had a urine pregnancy test which was positive about 3 week(s)  ago. Her last menstrual period was normal and lasted for  5 day(s). Since her LMP she claims she has experienced fatigue and breast tenderness and she has had some hot flashes.. She denies vaginal bleeding. Her past medical history is noncontributory. Her prior pregnancies are notable for none  Since her LMP, she admits to the use of tobacco products  no She claims she has gained   no pounds since the start of her pregnancy.  There are cats in the home in the home  no She admits close contact with children on a regular basis  no  She has had chicken pox in the past no She has had Tuberculosis exposures, symptoms, or previously tested positive for TB   no Current or past history of domestic violence. no  Genetic Screening/Teratology Counseling: (Includes patient, baby's father, or anyone in either family with:)   1. Patient's age >/= 63 at [facility]  no 2. Thalassemia ([place], [place], Mediterranean, or Asian background): MCV<80  no 3. Neural tube defect (meningomyelocele, spina bifida, anencephaly)  no 4. Congenital heart defect  no  5. Down syndrome  no 6. Tay-Sachs (Jewish, [place]  no 7. Canavan's Disease  no 8. Sickle cell disease or trait (African)  no  9. Hemophilia or other blood disorders  no  10. Muscular dystrophy  no  11. Cystic fibrosis  no  12. Huntington's Chorea  no  13. Mental retardation/autism  Yes- she has a cousin with Autism14. Other  inherited genetic or chromosomal disorder  no 15. Maternal metabolic disorder (DM, PKU, etc)  no 16. Patient or FOB with a child with a birth defect not listed above no  16a. Patient or FOB with a birth defect themselves no 17. Recurrent pregnancy loss, or stillbirth  no  18. Any medications since LMP other than prenatal vitamins (include vitamins, supplements, OTC meds, drugs, alcohol)  no 19. Any other genetic/environmental exposure to discuss  no  Infection History:   1. Lives with someone with TB or TB exposed  no  2. Patient or partner has history of genital herpes  no 3. Rash or viral illness since LMP  no 4. History of STI (GC, CT, HPV, syphilis, HIV)  no 5. History of recent travel :  no  Other pertinent information:  no     Review of Systems:10 point review of systems negative unless otherwise noted in HPI  Past Medical History:  Past Medical History:  Diagnosis Date  . Dysmenorrhea   . Pre-diabetes   . Type O blood, Rh negative 01/2021  . Vitamin D deficiency     Past Surgical History:  Past Surgical History:  Procedure Laterality Date  . NO PAST SURGERIES      Gynecologic History: Patient's last menstrual period was 01/21/2021 (exact date).  Obstetric History: G1P0000  Family History:  Family History  Problem Relation Age of Onset  .  Diabetes Mother   . Hypertension Mother   . Diabetes Father   . Hypertension Father   . Thyroid disease Sister   . Breast cancer Neg Hx   . Ovarian cancer Neg Hx     Social History:  Social History   Socioeconomic History  . Marital status: Single    Spouse name: Not on file  . Number of children: Not on file  . Years of education: Not on file  . Highest education level: Not on file  Occupational History  . Not on file  Tobacco Use  . Smoking status: Never  . Smokeless tobacco: Never  Vaping Use  . Vaping Use: Never used  Substance and Sexual Activity  . Alcohol use: Never  . Drug use: Never  . Sexual  activity: Yes    Partners: Male    Birth control/protection: None  Other Topics Concern  . Not on file  Social History Narrative  . Not on file   Social Determinants of Health   Financial Resource Strain: Not on file  Food Insecurity: Not on file  Transportation Needs: Not on file  Physical Activity: Not on file  Stress: Not on file  Social Connections: Not on file  Intimate Partner Violence: Not on file    Allergies:  No Known Allergies  Medications: Prior to Admission medications   Not on File    Physical Exam Vitals: Blood pressure 122/70, height 5\' 4"  (1.626 m), weight 192 lb 12.8 oz (87.5 kg), last menstrual period 01/21/2021.  General: NAD HEENT: normocephalic, anicteric Thyroid: no enlargement, no palpable nodules Pulmonary: No increased work of breathing, CTAB Cardiovascular: RRR, distal pulses 2+ Abdomen: NABS, soft, non-tender, non-distended.  Umbilicus without lesions.  No hepatomegaly, splenomegaly or masses palpable. No evidence of hernia  Genitourinary:  External: Normal external female genitalia.  Normal urethral meatus, normal  Bartholin's and Skene's glands.    Vagina: Normal vaginal mucosa, no evidence of prolapse.    Cervix: Grossly normal in appearance, no bleeding  Uterus: anteverted, slightly enlarged, mobile, normal contour.  No CMT  Adnexa: ovaries non-enlarged, no adnexal masses  Rectal: deferred Extremities: no edema, erythema, or tenderness Neurologic: Grossly intact Psychiatric: mood appropriate, affect full   Assessment: 25 y.o. G1P0000 at [redacted]w[redacted]d presenting to initiate prenatal care  Plan: 1) Avoid alcoholic beverages. 2) Patient encouraged not to smoke.  3) Discontinue the use of all non-medicinal drugs and chemicals.  4) Take prenatal vitamins daily.  5) Nutrition, food safety (fish, cheese advisories, and high nitrite foods) and exercise discussed. 6) Hospital and practice style discussed with cross coverage system.  7) Genetic  Screening, such as with 1st Trimester Screening, cell free fetal DNA, AFP testing, and Ultrasound, as well as with amniocentesis and CVS as appropriate, is discussed with patient. At the conclusion of today's visit patient needs additional education. genetic testing 8) Patient is asked about travel to areas at risk for the Zika virus, and counseled to avoid travel and exposure to mosquitoes or sexual partners who may have themselves been exposed to the virus. Testing is discussed, and will be ordered as appropriate.   Introduced to the [redacted]w[redacted]d. Breastfeeding discussed and encouraged as noted below. The following were addressed during this visit:  Breastfeeding Education - Early initiation of breastfeeding    Comments: Keeps milk supply adequate, helps contract uterus and slow bleeding, and early milk is the perfect first food and is easy to digest.   - The importance of exclusive breastfeeding  Comments: Provides antibodies, Lower risk of breast and ovarian cancers, and type-2 diabetes,Helps your body recover, Reduced chance of SIDS.   - Risks of giving your baby anything other than breast milk if you are breastfeeding    Comments: Make the baby less content with breastfeeds, may make my baby more susceptible to illness, and may reduce my milk supply.   - Feeding on demand or baby-led feeding    Comments: Helps prevent breastfeeding complications, helps bring in good milk supply, prevents under or overfeeding, and helps baby feel content and satisfied   - Frequent feeding to help assure optimal milk production    Comments: Making a full supply of milk requires frequent removal of milk from breasts, infant will eat 8-12 times in 24 hours, if separated from infant use breast massage, hand expression and/ or pumping to remove milk from breasts.   - Exclusive breastfeeding for the first 6 months    Comments: Builds a healthy milk supply and keeps it up, protects baby from sickness and  disease, and breastmilk has everything your baby needs for the first 6 months.  - Individualized Education    Comments: Contraindications to breastfeeding and other special medical conditions   dating scan in 2 weeks with MD. Will have blood work then as well

## 2021-03-17 ENCOUNTER — Encounter: Payer: Self-pay | Admitting: Obstetrics

## 2021-03-17 DIAGNOSIS — O0990 Supervision of high risk pregnancy, unspecified, unspecified trimester: Secondary | ICD-10-CM | POA: Insufficient documentation

## 2021-03-17 DIAGNOSIS — Z3400 Encounter for supervision of normal first pregnancy, unspecified trimester: Secondary | ICD-10-CM | POA: Insufficient documentation

## 2021-03-17 LAB — CYTOLOGY - PAP
Chlamydia: NEGATIVE
Comment: NEGATIVE
Comment: NEGATIVE
Comment: NORMAL
Diagnosis: NEGATIVE
Neisseria Gonorrhea: NEGATIVE
Trichomonas: NEGATIVE

## 2021-03-17 LAB — URINE CULTURE

## 2021-03-21 ENCOUNTER — Other Ambulatory Visit: Payer: Self-pay

## 2021-03-21 DIAGNOSIS — Z3A08 8 weeks gestation of pregnancy: Secondary | ICD-10-CM | POA: Insufficient documentation

## 2021-03-21 DIAGNOSIS — N8312 Corpus luteum cyst of left ovary: Secondary | ICD-10-CM | POA: Insufficient documentation

## 2021-03-21 DIAGNOSIS — J029 Acute pharyngitis, unspecified: Secondary | ICD-10-CM | POA: Diagnosis not present

## 2021-03-21 DIAGNOSIS — O00202 Left ovarian pregnancy without intrauterine pregnancy: Secondary | ICD-10-CM | POA: Insufficient documentation

## 2021-03-21 DIAGNOSIS — Z20822 Contact with and (suspected) exposure to covid-19: Secondary | ICD-10-CM | POA: Diagnosis not present

## 2021-03-21 LAB — CBC
HCT: 31.7 % — ABNORMAL LOW (ref 36.0–46.0)
Hemoglobin: 10.5 g/dL — ABNORMAL LOW (ref 12.0–15.0)
MCH: 26.7 pg (ref 26.0–34.0)
MCHC: 33.1 g/dL (ref 30.0–36.0)
MCV: 80.7 fL (ref 80.0–100.0)
Platelets: 223 10*3/uL (ref 150–400)
RBC: 3.93 MIL/uL (ref 3.87–5.11)
RDW: 13.3 % (ref 11.5–15.5)
WBC: 10.5 10*3/uL (ref 4.0–10.5)
nRBC: 0 % (ref 0.0–0.2)

## 2021-03-21 LAB — URINALYSIS, COMPLETE (UACMP) WITH MICROSCOPIC
Bacteria, UA: NONE SEEN
Bilirubin Urine: NEGATIVE
Glucose, UA: NEGATIVE mg/dL
Hgb urine dipstick: NEGATIVE
Ketones, ur: NEGATIVE mg/dL
Leukocytes,Ua: NEGATIVE
Nitrite: NEGATIVE
Protein, ur: NEGATIVE mg/dL
Specific Gravity, Urine: 1.013 (ref 1.005–1.030)
WBC, UA: NONE SEEN WBC/hpf (ref 0–5)
pH: 7 (ref 5.0–8.0)

## 2021-03-21 LAB — COMPREHENSIVE METABOLIC PANEL
ALT: 11 U/L (ref 0–44)
AST: 15 U/L (ref 15–41)
Albumin: 3.4 g/dL — ABNORMAL LOW (ref 3.5–5.0)
Alkaline Phosphatase: 64 U/L (ref 38–126)
Anion gap: 6 (ref 5–15)
BUN: 10 mg/dL (ref 6–20)
CO2: 26 mmol/L (ref 22–32)
Calcium: 9.3 mg/dL (ref 8.9–10.3)
Chloride: 105 mmol/L (ref 98–111)
Creatinine, Ser: 0.65 mg/dL (ref 0.44–1.00)
GFR, Estimated: >60 mL/min (ref >60)
Glucose, Bld: 101 mg/dL — ABNORMAL HIGH (ref 70–99)
Potassium: 3.6 mmol/L (ref 3.5–5.1)
Sodium: 137 mmol/L (ref 135–145)
Total Bilirubin: 0.6 mg/dL (ref 0.3–1.2)
Total Protein: 7.3 g/dL (ref 6.5–8.1)

## 2021-03-21 LAB — HCG, QUANTITATIVE, PREGNANCY: hCG, Beta Chain, Quant, S: 152815 m[IU]/mL — ABNORMAL HIGH (ref <5)

## 2021-03-21 LAB — LIPASE, BLOOD: Lipase: 28 U/L (ref 11–51)

## 2021-03-21 NOTE — ED Triage Notes (Signed)
Pt presents to ER c/o lower abdominal pain that started around 2 days ago but has been consistent today.  Pt states she is 7-[redacted] weekS pregnant appx.  Pt states she has had n/v but no diarrhea.  Pt denies vaginal bleeding at this time.  Pt states she has had some urinary symptoms and has been pooping more often.

## 2021-03-22 ENCOUNTER — Emergency Department: Payer: Managed Care, Other (non HMO)

## 2021-03-22 ENCOUNTER — Emergency Department
Admission: EM | Admit: 2021-03-22 | Discharge: 2021-03-22 | Disposition: A | Discharge status: Home / Self Care | Payer: Managed Care, Other (non HMO) | Attending: Emergency Medicine | Admitting: Emergency Medicine

## 2021-03-22 DIAGNOSIS — N8312 Corpus luteum cyst of left ovary: Secondary | ICD-10-CM

## 2021-03-22 DIAGNOSIS — R1030 Lower abdominal pain, unspecified: Secondary | ICD-10-CM

## 2021-03-22 DIAGNOSIS — Z3490 Encounter for supervision of normal pregnancy, unspecified, unspecified trimester: Secondary | ICD-10-CM

## 2021-03-22 DIAGNOSIS — J029 Acute pharyngitis, unspecified: Secondary | ICD-10-CM

## 2021-03-22 LAB — SARS CORONAVIRUS 2 (TAT 6-24 HRS): SARS Coronavirus 2: NEGATIVE

## 2021-03-22 LAB — GROUP A STREP BY PCR: Group A Strep by PCR: NOT DETECTED

## 2021-03-22 NOTE — ED Notes (Signed)
Patient to ultrasound at this time.

## 2021-03-22 NOTE — ED Provider Notes (Signed)
[facility] Emergency Department Provider Note  ____________________________________________   Event Date/Time   First MD Initiated Contact with Patient 03/22/21 [phone]     (approximate)  I have reviewed the triage vital signs and the nursing notes.   HISTORY  Chief Complaint Abdominal Pain    HPI Daisy Douglas is a 25 y.o. female G1 P0 at approximately [redacted] weeks gestation who presents for evaluation of about 2 days of sharp pain in her left lower quadrant.  Nothing in particular makes it better or worse and it gets better at times but then gets worse again.  She has had nausea for a longer period of time which she attributed to "morning sickness".  She denies vaginal bleeding, chest pain, shortness of breath, vomiting, and fever.  She has had a sore throat for about a week that hurts more in the morning and makes it feel like it is hard to swallow but she is not having any trouble swallowing during the day and not having any trouble speaking.     Past Medical History:  Diagnosis Date   Dysmenorrhea    Pre-diabetes    Type O blood, Rh negative 01/2021   Vitamin D deficiency     Patient Active Problem List   Diagnosis Date Noted   Supervision of normal first pregnancy, antepartum 03/17/2021   Anxiety 01/20/2019   Vitamin D deficiency 01/20/2019    Past Surgical History:  Procedure Laterality Date   NO PAST SURGERIES      Prior to Admission medications   Not on File    Allergies Patient has no known allergies.  Family History  Problem Relation Age of Onset   Diabetes Mother    Hypertension Mother    Diabetes Father    Hypertension Father    Thyroid disease Sister    Breast cancer Neg Hx    Ovarian cancer Neg Hx     Social History Social History   Tobacco Use   Smoking status: Never   Smokeless tobacco: Never  Vaping Use   Vaping Use: Never used  Substance Use Topics   Alcohol use: Never   Drug use: Never    Review of  Systems Constitutional: No fever/chills Eyes: No visual changes. ENT: No sore throat. Cardiovascular: Denies chest pain. Respiratory: Denies shortness of breath. Gastrointestinal: Left lower quadrant abdominal pain with nausea, no vomiting.  Genitourinary: Negative for dysuria.  Negative for vaginal bleeding. Musculoskeletal: Negative for neck pain.  Negative for back pain. Integumentary: Negative for rash. Neurological: Negative for headaches, focal weakness or numbness.   ____________________________________________   PHYSICAL EXAM:  VITAL SIGNS: ED Triage Vitals  Enc Vitals Group     BP 03/21/21 2119 133/81     Pulse Rate 03/21/21 2119 71     Resp 03/21/21 2119 15     Temp 03/21/21 2119 98.3 F (36.8 C)     Temp Source 03/21/21 2119 Oral     SpO2 03/21/21 2119 100 %     Weight --      Height --      Head Circumference --      Peak Flow --      Pain Score 03/21/21 2120 8     Pain Loc --      Pain Edu? --      Excl. in GC? --     Constitutional: Alert and oriented.  Eyes: Conjunctivae are normal.  Head: Atraumatic. Nose: No congestion/rhinnorhea. Mouth/Throat: Normal oropharynx, no exudate, petechiae,  nor swelling of the tonsils.  No indication of peritonsillar abscess. Neck: No stridor.  No meningeal signs.   Cardiovascular: Normal rate, regular rhythm. Good peripheral circulation. Respiratory: Normal respiratory effort.  No retractions. Gastrointestinal: Soft and nondistended.  Mild tenderness palpation in the lower abdomen, no localized peritonitis. Genitourinary: Deferred pelvic exam Musculoskeletal: No lower extremity tenderness nor edema. No gross deformities of extremities. Neurologic:  Normal speech and language. No gross focal neurologic deficits are appreciated.  Skin:  Skin is warm, dry and intact. Psychiatric: Mood and affect are normal. Speech and behavior are normal.  ____________________________________________   LABS (all labs ordered are  listed, but only abnormal results are displayed)  Labs Reviewed  COMPREHENSIVE METABOLIC PANEL - Abnormal; Notable for the following components:      Result Value   Glucose, Bld 101 (*)    Albumin 3.4 (*)    All other components within normal limits  CBC - Abnormal; Notable for the following components:   Hemoglobin 10.5 (*)    HCT 31.7 (*)    All other components within normal limits  URINALYSIS, COMPLETE (UACMP) WITH MICROSCOPIC - Abnormal; Notable for the following components:   Color, Urine YELLOW (*)    APPearance CLEAR (*)    All other components within normal limits  HCG, QUANTITATIVE, PREGNANCY - Abnormal; Notable for the following components:   hCG, Beta Chain, Quant, S 152,815 (*)    All other components within normal limits  GROUP A STREP BY PCR  SARS CORONAVIRUS 2 (TAT 6-24 HRS)  LIPASE, BLOOD   ____________________________________________   RADIOLOGY I, Loleta Rose, personally viewed and evaluated these images (plain radiographs) as part of my medical decision making, as well as reviewing the written report by the radiologist.  ED MD interpretation: Left-sided corpus luteum cysts, single viable intrauterine pregnancy at approximately 8 weeks and 1 day gestation, very small subchorionic hemorrhage.  Official radiology report(s): US OB LESS THAN 14 WEEKS WITH OB TRANSVAGINAL  Result Date: 03/22/2021 CLINICAL DATA:  Lower abdominal pain x2 days. EXAM: OBSTETRIC <14 WK [place] AND TRANSVAGINAL OB US TECHNIQUE: Both transabdominal and transvaginal ultrasound examinations were performed for complete evaluation of the gestation as well as the maternal uterus, adnexal regions, and pelvic cul-de-sac. Transvaginal technique was performed to assess early pregnancy. COMPARISON:  None. FINDINGS: Intrauterine gestational sac: Single Yolk sac:  Visualized. Embryo:  Visualized. Cardiac Activity: Visualized. Heart Rate: 171 bpm CRL:  16.5 mm   8 w   1 d                  [place] EDC: October 31, 2021 Subchorionic hemorrhage: Small (approximately 1.5 cm x 0.5 cm x 2.0 cm) Maternal uterus/adnexae: The right ovary is visualized and is normal in appearance. A 2.0 cm x 1.3 cm x 1.3 cm corpus luteum cyst is seen within an otherwise normal appearing left ovary. A trace amount of pelvic free fluid is noted. IMPRESSION: Single, viable intrauterine pregnancy at approximately 8 weeks and 1 day gestation by ultrasound evaluation. Electronically Signed   By: Aram Candela M.D.   On: 03/22/2021 01:19    ____________________________________________   PROCEDURES   Procedure(s) performed (including Critical Care):  Procedures   ____________________________________________   INITIAL IMPRESSION / MDM / ASSESSMENT AND PLAN / ED COURSE  As part of my medical decision making, I reviewed the following data within the electronic MEDICAL RECORD NUMBER Nursing notes reviewed and incorporated, Labs reviewed , Old chart reviewed, and Notes from prior ED  visits   Differential diagnosis includes, but is not limited to, pregnancy related abdominal pain, ovarian cyst, ectopic pregnancy, less likely atypical appendicitis.  The patient has been having nausea but no vomiting.  Vital signs are normal and stable , afebrile.  No vaginal bleeding.  Normal CBC with no leukocytosis, normal lipase, normal comprehensive metabolic panel.  Urinalysis shows no sign of infection.  Ultrasound is generally reassuring with an 8-week and 1 day gestation pregnancy and a left corpus luteum cyst which is likely the cause of her pain.  There is a small subchorionic hemorrhage and I told her about this as well but we discussed the fact that it is likely the cyst that is causing her discomfort and it should resolve on its own.  I reminded her to not take NSAIDs.  During our discussion she told me that she has a sore throat which no one has discussed.  Her exam is reassuring, and I will take a strep test and a COVID test.  She knows to  follow-up tomorrow for the COVID results.       Clinical Course as of 03/22/21 0403  Wed Mar 22, 2021  5726 Patient well-appearing and in no apparent distress.  Strep negative.  Patient comfortable with the plan for d/c.  Knows to check MyChart tomorrow for COVID results.  Gaev usual/customary return precautions. [CF]    Clinical Course User Index [CF] Loleta Rose, MD     ____________________________________________  FINAL CLINICAL IMPRESSION(S) / ED DIAGNOSES  Final diagnoses:  Lower abdominal pain  Early stage of pregnancy  Sore throat  Corpus luteum cyst of left ovary     MEDICATIONS GIVEN DURING THIS VISIT:  Medications - No data to display   ED Discharge Orders     None        Note:  This document was prepared using Dragon voice recognition software and may include unintentional dictation errors.   Loleta Rose, MD 03/22/21 [phone]

## 2021-03-22 NOTE — Discharge Instructions (Addendum)
As we discussed, your evaluation was generally reassuring.  You have a cyst on your left ovary, which is a normal and common thing, but it could be causing the pain you are experiencing.  Your ultrasound showed a single intrauterine pregnancy at about 8 weeks and 1 day gestation.  There was a very small subchorionic hemorrhage which is also relatively common and will likely resolve on its own.  This is something you can discuss with your OB/GYN provider when you follow-up.  For now, continue to take your regular prenatal vitamins and any other medications you have been prescribed and follow-up at the next available opportunity with OB.  Return to the emergency department if you develop new or worsening symptoms that concern you.  Remember to check MyChart tomorrow for the results of your COVID test.

## 2021-03-22 NOTE — ED Notes (Signed)
Patient returned from ultrasound.

## 2021-03-23 ENCOUNTER — Emergency Department: Payer: Managed Care, Other (non HMO)

## 2021-03-23 ENCOUNTER — Other Ambulatory Visit: Payer: Self-pay

## 2021-03-23 ENCOUNTER — Ambulatory Visit: Payer: Self-pay | Admitting: *Deleted

## 2021-03-23 ENCOUNTER — Encounter: Payer: Self-pay | Admitting: *Deleted

## 2021-03-23 DIAGNOSIS — N9489 Other specified conditions associated with female genital organs and menstrual cycle: Secondary | ICD-10-CM | POA: Diagnosis not present

## 2021-03-23 DIAGNOSIS — Z5321 Procedure and treatment not carried out due to patient leaving prior to being seen by health care provider: Secondary | ICD-10-CM | POA: Insufficient documentation

## 2021-03-23 DIAGNOSIS — O208 Other hemorrhage in early pregnancy: Secondary | ICD-10-CM | POA: Diagnosis not present

## 2021-03-23 DIAGNOSIS — O26891 Other specified pregnancy related conditions, first trimester: Secondary | ICD-10-CM | POA: Diagnosis not present

## 2021-03-23 DIAGNOSIS — Z3A08 8 weeks gestation of pregnancy: Secondary | ICD-10-CM | POA: Diagnosis not present

## 2021-03-23 DIAGNOSIS — O4690 Antepartum hemorrhage, unspecified, unspecified trimester: Secondary | ICD-10-CM

## 2021-03-23 LAB — CBC
HCT: 30.3 % — ABNORMAL LOW (ref 36.0–46.0)
Hemoglobin: 10.3 g/dL — ABNORMAL LOW (ref 12.0–15.0)
MCH: 28.1 pg (ref 26.0–34.0)
MCHC: 34 g/dL (ref 30.0–36.0)
MCV: 82.6 fL (ref 80.0–100.0)
Platelets: 207 10*3/uL (ref 150–400)
RBC: 3.67 MIL/uL — ABNORMAL LOW (ref 3.87–5.11)
RDW: 13.6 % (ref 11.5–15.5)
WBC: 9.7 10*3/uL (ref 4.0–10.5)
nRBC: 0 % (ref 0.0–0.2)

## 2021-03-23 LAB — COMPREHENSIVE METABOLIC PANEL
ALT: 10 U/L (ref 0–44)
AST: 17 U/L (ref 15–41)
Albumin: 3.7 g/dL (ref 3.5–5.0)
Alkaline Phosphatase: 59 U/L (ref 38–126)
Anion gap: 7 (ref 5–15)
BUN: 6 mg/dL (ref 6–20)
CO2: 22 mmol/L (ref 22–32)
Calcium: 8.9 mg/dL (ref 8.9–10.3)
Chloride: 105 mmol/L (ref 98–111)
Creatinine, Ser: 0.58 mg/dL (ref 0.44–1.00)
GFR, Estimated: >60 mL/min (ref >60)
Glucose, Bld: 118 mg/dL — ABNORMAL HIGH (ref 70–99)
Potassium: 3.5 mmol/L (ref 3.5–5.1)
Sodium: 134 mmol/L — ABNORMAL LOW (ref 135–145)
Total Bilirubin: 0.7 mg/dL (ref 0.3–1.2)
Total Protein: 7.4 g/dL (ref 6.5–8.1)

## 2021-03-23 LAB — ABO/RH: ABO/RH(D): O NEG

## 2021-03-23 LAB — HCG, QUANTITATIVE, PREGNANCY: hCG, Beta Chain, Quant, S: 136737 m[IU]/mL — ABNORMAL HIGH (ref <5)

## 2021-03-23 NOTE — Telephone Encounter (Signed)
Reason for Disposition  MILD-MODERATE vaginal bleeding (e.g., small to medium clots; like mild menstrual period) (Exception: Single episode of faint spotting when wiping, or slight spotting after intercourse or pelvic exam)  Answer Assessment - Initial Assessment Questions 1. ONSET: "When did this bleeding start?"        today 2. DESCRIPTION: "Describe the bleeding that you are having." "How much bleeding is there?"    - SPOTTING: spotting, or pinkish / brownish mucous discharge; does not fill panty liner or pad    - MILD:  less than 1 pad / hour; less than patient's usual menstrual bleeding   - MODERATE: 1-2 pads / hour; 1 menstrual cup every 6 hours; small-medium blood clots (e.g., pea, grape, small coin)   - SEVERE: soaking 2 or more pads/hour for 2 or more hours; 1 menstrual cup every 2 hours; bleeding not contained by pads or continuous red blood from vagina; large blood clots (e.g., golf ball, large coin)      Mild 3. ABDOMINAL PAIN SEVERITY: If present, ask: "How bad is it?"  (e.g., Scale 1-10; mild, moderate, or severe)   - MILD (1-3): doesn't interfere with normal activities, abdomen soft and not tender to touch    - MODERATE (4-7): interferes with normal activities or awakens from sleep, abdomen tender to touch    - SEVERE (8-10): excruciating pain, doubled over, unable to do any normal activities     Sharp pain for 3 days 4. PREGNANCY: "Do you know how many weeks or months pregnant you are?"      2 months and 2 days 5. EDD: "What date are you expecting to deliver?"      6. FETAL MOVEMENT: "Has the baby's movement decreased or changed significantly from normal?"      7. HEMODYNAMIC STATUS: "Are you weak or feeling lightheaded?" If Yes, ask: "Can you stand and walk normally?"       8. OTHER SYMPTOMS: "What other symptoms are you having with the bleeding?" (e.g., leaking fluid from vagina, contractions)     Headache, fatigued  Protocols used: Pregnancy - Vaginal Bleeding Greater  Than [redacted] Weeks EGA-A-AH
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
## 2021-03-24 ENCOUNTER — Encounter: Payer: Self-pay | Admitting: Obstetrics and Gynecology

## 2021-03-24 ENCOUNTER — Emergency Department
Admission: EM | Admit: 2021-03-24 | Discharge: 2021-03-24 | Disposition: A | Discharge status: Home / Self Care | Payer: Managed Care, Other (non HMO) | Attending: Emergency Medicine | Admitting: Emergency Medicine

## 2021-03-24 ENCOUNTER — Other Ambulatory Visit: Payer: Self-pay | Admitting: Obstetrics

## 2021-03-24 ENCOUNTER — Other Ambulatory Visit: Payer: Self-pay

## 2021-03-24 ENCOUNTER — Other Ambulatory Visit: Payer: Managed Care, Other (non HMO)

## 2021-03-24 DIAGNOSIS — O209 Hemorrhage in early pregnancy, unspecified: Secondary | ICD-10-CM

## 2021-03-24 DIAGNOSIS — O4690 Antepartum hemorrhage, unspecified, unspecified trimester: Secondary | ICD-10-CM
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
## 2021-03-30 ENCOUNTER — Other Ambulatory Visit: Payer: Self-pay | Admitting: Advanced Practice Midwife

## 2021-03-30 ENCOUNTER — Telehealth: Payer: Self-pay

## 2021-03-30 DIAGNOSIS — O200 Threatened abortion: Secondary | ICD-10-CM

## 2021-03-30 LAB — BETA HCG QUANT (REF LAB): hCG Quant: 110557 m[IU]/mL

## 2021-03-30 NOTE — Telephone Encounter (Signed)
Pt left msg on triage asking for someone to call her about her results. Called pt back and advised [facility] is out of the office until next week. She would like msg to be sent to on call provider, she does not want to wait this long.

## 2021-03-30 NOTE — Telephone Encounter (Signed)
Spoke with patient and discussed beta levels- dropping. She is already scheduled for dating ultrasound on 8/24. She denies any current bleeding.

## 2021-04-04 ENCOUNTER — Encounter: Payer: Self-pay | Admitting: Obstetrics & Gynecology

## 2021-04-04 ENCOUNTER — Ambulatory Visit (INDEPENDENT_AMBULATORY_CARE_PROVIDER_SITE_OTHER): Payer: Managed Care, Other (non HMO) | Admitting: Obstetrics & Gynecology

## 2021-04-04 ENCOUNTER — Other Ambulatory Visit: Payer: Self-pay

## 2021-04-04 VITALS — BP 120/80 | Wt 199.0 lb

## 2021-04-04 DIAGNOSIS — Z3A10 10 weeks gestation of pregnancy: Secondary | ICD-10-CM

## 2021-04-04 DIAGNOSIS — N926 Irregular menstruation, unspecified: Secondary | ICD-10-CM

## 2021-04-04 DIAGNOSIS — Z369 Encounter for antenatal screening, unspecified: Secondary | ICD-10-CM

## 2021-04-04 DIAGNOSIS — Z3480 Encounter for supervision of other normal pregnancy, unspecified trimester: Secondary | ICD-10-CM

## 2021-04-04 DIAGNOSIS — Z1379 Encounter for other screening for genetic and chromosomal anomalies: Secondary | ICD-10-CM

## 2021-04-04 LAB — POCT URINALYSIS DIPSTICK OB
Glucose, UA: NEGATIVE
POC,PROTEIN,UA: NEGATIVE

## 2021-04-04 NOTE — Progress Notes (Signed)
  Subjective  Nausea, crampiness, breast T reported [place] at hospital last week w viable IUP, SCH No VB reported  Objective  BP 120/80   Wt 199 lb (90.3 kg)   LMP 01/21/2021 (Exact Date)   BMI 34.16 kg/m  General: NAD Pumonary: no increased work of breathing Abdomen: gravid, non-tender Extremities: no edema Psychiatric: mood appropriate, affect full  Assessment  24 y.o. G1P0000 at [redacted]w[redacted]d by  10/28/2021, by Last Menstrual Period presenting for routine prenatal visit  Plan   Problem List Items Addressed This Visit   None Visit Diagnoses    Supervision of other normal pregnancy, antepartum    -  Primary   Relevant Orders   POC Urinalysis Dipstick OB (Completed)   Encounter for genetic screening for Down Syndrome       Relevant Orders   MaterniT21 PLUS Core+SCA   [redacted] weeks gestation of pregnancy       Visit for prenatal screening       Relevant Orders   RPR+Rh+ABO+Rub Ab+Ab Scr+CB...   Hepatitis C antibody   Irregular menses        See [place] report Labs and NIPT today  Annamarie Major, MD, [facility], [facility] 04/04/2021  4:28 PM

## 2021-04-04 NOTE — Progress Notes (Signed)
ULTRASOUND REPORT  Location: [facility] Date of Service: 04/04/2021   Indications:Unsure LMP Findings:  Mason Jim intrauterine pregnancy is visualized with a CRL consistent with [redacted]w[redacted]d gestation, giving an (U/S) EDD of 11/03/21. The (U/S) EDD is consistent with the clinically established EDD of 3/11 23 and prior US at [facility].  FHR: 150 BPM CRL measurement: 27.5 mm Yolk sac is visualized and appears normal. Amnion: visualized and appears normal   Right Ovary is normal in appearance. Left Ovary is normal appearance. Corpus luteal cyst:  is not visualized Survey of the adnexa demonstrates no adnexal masses. There is no free peritoneal fluid in the cul de sac.  Impression: 1. [redacted]w[redacted]d Viable Singleton Intrauterine pregnancy by U/S. 2. (U/S) EDD is consistent with Clinically established EDD of 10/28/21.  Recommendations: 1.Clinical correlation with the patient's History and Physical Exam. 2. Keep EDC 3. No SCH seen  Letitia Libra, MD

## 2021-04-06 LAB — RPR+RH+ABO+RUB AB+AB SCR+CB...
Antibody Screen: NEGATIVE
HIV Screen 4th Generation wRfx: NONREACTIVE
Hematocrit: 32.7 % — ABNORMAL LOW (ref 34.0–46.6)
Hemoglobin: 10.7 g/dL — ABNORMAL LOW (ref 11.1–15.9)
Hepatitis B Surface Ag: NEGATIVE
MCH: 26.6 pg (ref 26.6–33.0)
MCHC: 32.7 g/dL (ref 31.5–35.7)
MCV: 81 fL (ref 79–97)
Platelets: 235 10*3/uL (ref 150–450)
RBC: 4.03 x10E6/uL (ref 3.77–5.28)
RDW: 13.4 % (ref 11.7–15.4)
RPR Ser Ql: REACTIVE — AB
Rh Factor: NEGATIVE
Rubella Antibodies, IGG: 2.8 index (ref >0.99)
Varicella zoster IgG: <135 index — ABNORMAL LOW (ref >165)
WBC: 10.4 10*3/uL (ref 3.4–10.8)

## 2021-04-06 LAB — RPR, QUANT+TP ABS (REFLEX)
Rapid Plasma Reagin, Quant: 1:1 {titer} — ABNORMAL HIGH
T Pallidum Abs: NONREACTIVE

## 2021-04-06 LAB — HEPATITIS C ANTIBODY: Hep C Virus Ab: <0.1 s/co ratio (ref 0.0–0.9)

## 2021-04-09 LAB — MATERNIT21 PLUS CORE+SCA
Fetal Fraction: 5
Monosomy X (Turner Syndrome): NOT DETECTED
Result (T21): NEGATIVE
Trisomy 13 (Patau syndrome): NEGATIVE
Trisomy 18 (Edwards syndrome): NEGATIVE
Trisomy 21 (Down syndrome): NEGATIVE
XXX (Triple X Syndrome): NOT DETECTED
XXY (Klinefelter Syndrome): NOT DETECTED
XYY (Jacobs Syndrome): NOT DETECTED

## 2021-04-12 ENCOUNTER — Other Ambulatory Visit: Payer: Managed Care, Other (non HMO)

## 2021-04-12 ENCOUNTER — Encounter: Payer: Managed Care, Other (non HMO) | Admitting: Obstetrics

## 2021-04-12 ENCOUNTER — Encounter: Payer: Managed Care, Other (non HMO) | Admitting: Obstetrics & Gynecology

## 2021-05-02 ENCOUNTER — Encounter: Payer: Self-pay | Admitting: Obstetrics and Gynecology

## 2021-05-02 ENCOUNTER — Ambulatory Visit (INDEPENDENT_AMBULATORY_CARE_PROVIDER_SITE_OTHER): Payer: Managed Care, Other (non HMO) | Admitting: Obstetrics and Gynecology

## 2021-05-02 ENCOUNTER — Other Ambulatory Visit: Payer: Self-pay

## 2021-05-02 VITALS — BP 122/74 | Wt 204.0 lb

## 2021-05-02 DIAGNOSIS — Z3402 Encounter for supervision of normal first pregnancy, second trimester: Secondary | ICD-10-CM

## 2021-05-02 DIAGNOSIS — Z6791 Unspecified blood type, Rh negative: Secondary | ICD-10-CM | POA: Insufficient documentation

## 2021-05-02 DIAGNOSIS — O219 Vomiting of pregnancy, unspecified: Secondary | ICD-10-CM

## 2021-05-02 DIAGNOSIS — Z3A14 14 weeks gestation of pregnancy: Secondary | ICD-10-CM

## 2021-05-02 DIAGNOSIS — O26899 Other specified pregnancy related conditions, unspecified trimester: Secondary | ICD-10-CM | POA: Insufficient documentation

## 2021-05-02 MED ORDER — ONDANSETRON 8 MG PO TBDP: 8.0000 mg | ORAL_TABLET | Freq: Three times a day (TID) | ORAL | 1 refills | Status: DC | PRN

## 2021-05-02 NOTE — Progress Notes (Signed)
  Routine Prenatal Care Visit  Subjective  Daisy Douglas is a 25 y.o. G1P0000 at [redacted]w[redacted]d being seen today for ongoing prenatal care.  She is currently monitored for the following issues for this low-risk pregnancy and has Anxiety; Vitamin D deficiency; Supervision of normal first pregnancy, antepartum; and Rh negative state in antepartum period on their problem list.  ----------------------------------------------------------------------------------- Patient reports nausea and vomiting.    . Vag. Bleeding: None.   . Leaking Fluid denies.  ----------------------------------------------------------------------------------- The following portions of the patient's history were reviewed and updated as appropriate: allergies, current medications, past family history, past medical history, past social history, past surgical history and problem list. Problem list updated.  Objective  Blood pressure 122/74, weight 204 lb (92.5 kg), last menstrual period 01/21/2021. Pregravid weight 187 lb (84.8 kg) Total Weight Gain 17 lb (7.711 kg) Urinalysis: Urine Protein    Urine Glucose    Fetal Status: Fetal Heart Rate (bpm): 155         General:  Alert, oriented and cooperative. Patient is in no acute distress.  Skin: Skin is warm and dry. No rash noted.   Cardiovascular: Normal heart rate noted  Respiratory: Normal respiratory effort, no problems with respiration noted  Abdomen: Soft, gravid, appropriate for gestational age.       Pelvic:  Cervical exam deferred        Extremities: Normal range of motion.     Mental Status: Normal mood and affect. Normal behavior. Normal judgment and thought content.   Assessment   24 y.o. G1P0000 at [redacted]w[redacted]d by  10/28/2021, by Last Menstrual Period presenting for routine prenatal visit  Plan   pregnancy 1 Problems (from 03/14/21 to present)     Problem Noted Resolved   Rh negative state in antepartum period 05/02/2021 by Conard Novak, MD No   Supervision of  normal first pregnancy, antepartum 03/17/2021 by Mirna Mires, CNM No   Overview Addendum 04/07/2021  2:48 PM by Mirna Mires, CNM     Nursing Staff Provider  Office Location  [facility] Dating    Language  English Anatomy US    Flu Vaccine   Genetic Screen  NIPS:   TDaP vaccine    Hgb A1C or  GTT Early : Third trimester :   Covid    LAB RESULTS   Rhogam   Blood Type   O-  Feeding Plan  Antibody  neg  Contraception  Rubella  immune  Circumcision  RPR   reactive with 1:1 ratio and neg T Pallidum  Pediatrician   HBsAg   neg  Support Person  HIV  NR  Prenatal Classes  Varicella Non immune    GBS  (For PCN allergy, check sensitivities)   BTL Consent     VBAC Consent  Pap  NILM    Hgb Electro      CF      SMA                   Preterm labor symptoms and general obstetric precautions including but not limited to vaginal bleeding, contractions, leaking of fluid and fetal movement were reviewed in detail with the patient. Please refer to After Visit Summary for other counseling recommendations.   - zofran for nausea - anatomy u/s ordered  Return in about 4 weeks (around 05/30/2021) for ROB.   Thomasene Mohair, MD, [facility], [facility] 05/02/2021 5:20 PM

## 2021-05-15 ENCOUNTER — Telehealth: Payer: Managed Care, Other (non HMO) | Admitting: Physician Assistant

## 2021-05-15 DIAGNOSIS — L309 Dermatitis, unspecified: Secondary | ICD-10-CM

## 2021-05-15 DIAGNOSIS — U071 COVID-19: Secondary | ICD-10-CM | POA: Diagnosis not present

## 2021-05-15 NOTE — Patient Instructions (Signed)
Daisy Douglas, thank you for joining Piedad Climes, PA-C for today's virtual visit.  While this provider is not your primary care provider (PCP), if your PCP is located in our provider database this encounter information will be shared with them immediately following your visit.  Consent: (Patient) Daisy Douglas provided verbal consent for this virtual visit at the beginning of the encounter.  Current Medications:  Current Outpatient Medications:    Prenatal Vit-Fe Fumarate-FA (PRENATAL MULTIVITAMIN) TABS tablet, Take 1 tablet by mouth daily at 12 noon., Disp: , Rfl:    ondansetron (ZOFRAN ODT) 8 MG disintegrating tablet, Take 1 tablet (8 mg total) by mouth every 8 (eight) hours as needed for nausea or vomiting., Disp: 60 tablet, Rfl: 1   Medications ordered in this encounter:  No orders of the defined types were placed in this encounter.    *If you need refills on other medications prior to your next appointment, please contact your pharmacy*  Follow-Up: Call back or seek an in-person evaluation if the symptoms worsen or if the condition fails to improve as anticipated.  Other Instructions Please keep well-hydrated and get plenty of rest. Start a saline nasal rinse to flush out your nasal passages. You can use plain Mucinex to help thin congestion. You can use a decongestant like Sudafed in the 2nd and 3rd trimesters when needed. (Sudafed PE is not safe, just plain Sudafed) Again discuss with your OB/Pharmacist before use.  Consider switching the Allegra to Claritin or Chlor-Trimeton as these are preferred in pregnancy.  If you have a humidifier, running in the bedroom at night. I want you to start OTC vitamin D3 1000 units daily along with your prenatal multivitamin.  Please take prescribed medications as directed.  You have been enrolled in a MyChart symptom monitoring program. Please answer these questions daily so we can keep track of how you are doing.  You were to  quarantine for 5 days from onset of your symptoms.  After day 5, if you have had no fever and you are feeling better, you can end quarantine but need to mask for an additional 5 days. After day 5 if you have a fever or are having significant symptoms, please quarantine for full 10 days.  If you note any worsening of symptoms, any significant shortness of breath or any chest pain, please seek ER evaluation ASAP.  Please do not delay care!   For eczema, follow care discussed at visit including an OTC Eczema cream, good hydration and OTC hydrocortisone cream twice daily. Follow-up with OB regarding this as well.   COVID-19: What to Do if You Are Sick If you test positive and are an older adult or someone who is at high risk of getting very sick from COVID-19, treatment may be available. Contact a healthcare provider right away after a positive test to determine if you are eligible, even if your symptoms are mild right now. You can also visit a Test to Treat location and, if eligible, receive a prescription from a provider. Don't delay: Treatment must be started within the first few days to be effective. If you have a fever, cough, or other symptoms, you might have COVID-19. Most people have mild illness and are able to recover at home. If you are sick: Keep track of your symptoms. If you have an emergency warning sign (including trouble breathing), call 911. Steps to help prevent the spread of COVID-19 if you are sick If you are sick with COVID-19 or think you  might have COVID-19, follow the steps below to care for yourself and to help protect other people in your home and community. Stay home except to get medical care Stay home. Most people with COVID-19 have mild illness and can recover at home without medical care. Do not leave your home, except to get medical care. Do not visit public areas and do not go to places where you are unable to wear a mask. Take care of yourself. Get rest and stay  hydrated. Take over-the-counter medicines, such as acetaminophen, to help you feel better. Stay in touch with your doctor. Call before you get medical care. Be sure to get care if you have trouble breathing, or have any other emergency warning signs, or if you think it is an emergency. Avoid public transportation, ride-sharing, or taxis if possible. Get tested If you have symptoms of COVID-19, get tested. While waiting for test results, stay away from others, including staying apart from those living in your household. Get tested as soon as possible after your symptoms start. Treatments may be available for people with COVID-19 who are at risk for becoming very sick. Don't delay: Treatment must be started early to be effective--some treatments must begin within 5 days of your first symptoms. Contact your healthcare provider right away if your test result is positive to determine if you are eligible. Self-tests are one of several options for testing for the virus that causes COVID-19 and may be more convenient than laboratory-based tests and point-of-care tests. Ask your healthcare provider or your local health department if you need help interpreting your test results. You can visit your state, tribal, local, and territorial health department's website to look for the latest local information on testing sites. Separate yourself from other people As much as possible, stay in a specific room and away from other people and pets in your home. If possible, you should use a separate bathroom. If you need to be around other people or animals in or outside of the home, wear a well-fitting mask. Tell your close contacts that they may have been exposed to COVID-19. An infected person can spread COVID-19 starting 48 hours (or 2 days) before the person has any symptoms or tests positive. By letting your close contacts know they may have been exposed to COVID-19, you are helping to protect everyone. See COVID-19 and  Animals if you have questions about pets. If you are diagnosed with COVID-19, someone from the health department may call you. Answer the call to slow the spread. Monitor your symptoms Symptoms of COVID-19 include fever, cough, or other symptoms. Follow care instructions from your healthcare provider and local health department. Your local health authorities may give instructions on checking your symptoms and reporting information. When to seek emergency medical attention Look for emergency warning signs* for COVID-19. If someone is showing any of these signs, seek emergency medical care immediately: Trouble breathing Persistent pain or pressure in the chest New confusion Inability to wake or stay awake Pale, gray, or blue-colored skin, lips, or nail beds, depending on skin tone *This list is not all possible symptoms. Please call your medical provider for any other symptoms that are severe or concerning to you. Call 911 or call ahead to your local emergency facility: Notify the operator that you are seeking care for someone who has or may have COVID-19. Call ahead before visiting your doctor Call ahead. Many medical visits for routine care are being postponed or done by phone or telemedicine. If you  have a medical appointment that cannot be postponed, call your doctor's office, and tell them you have or may have COVID-19. This will help the office protect themselves and other patients. If you are sick, wear a well-fitting mask You should wear a mask if you must be around other people or animals, including pets (even at home). Wear a mask with the best fit, protection, and comfort for you. You don't need to wear the mask if you are alone. If you can't put on a mask (because of trouble breathing, for example), cover your coughs and sneezes in some other way. Try to stay at least 6 feet away from other people. This will help protect the people around you. Masks should not be placed on young  children under age 56 years, anyone who has trouble breathing, or anyone who is not able to remove the mask without help. Cover your coughs and sneezes Cover your mouth and nose with a tissue when you cough or sneeze. Throw away used tissues in a lined trash can. Immediately wash your hands with soap and water for at least 20 seconds. If soap and water are not available, clean your hands with an alcohol-based hand sanitizer that contains at least 60% alcohol. Clean your hands often Wash your hands often with soap and water for at least 20 seconds. This is especially important after blowing your nose, coughing, or sneezing; going to the bathroom; and before eating or preparing food. Use hand sanitizer if soap and water are not available. Use an alcohol-based hand sanitizer with at least 60% alcohol, covering all surfaces of your hands and rubbing them together until they feel dry. Soap and water are the best option, especially if hands are visibly dirty. Avoid touching your eyes, nose, and mouth with unwashed hands. Handwashing Tips Avoid sharing personal household items Do not share dishes, drinking glasses, cups, eating utensils, towels, or bedding with other people in your home. Wash these items thoroughly after using them with soap and water or put in the dishwasher. Clean surfaces in your home regularly Clean and disinfect high-touch surfaces (for example, doorknobs, tables, handles, light switches, and countertops) in your "sick room" and bathroom. In shared spaces, you should clean and disinfect surfaces and items after each use by the person who is ill. If you are sick and cannot clean, a caregiver or other person should only clean and disinfect the area around you (such as your bedroom and bathroom) on an as needed basis. Your caregiver/other person should wait as long as possible (at least several hours) and wear a mask before entering, cleaning, and disinfecting shared spaces that you  use. Clean and disinfect areas that may have blood, stool, or body fluids on them. Use household cleaners and disinfectants. Clean visible dirty surfaces with household cleaners containing soap or detergent. Then, use a household disinfectant. Use a product from [employer] List N: Disinfectants for Coronavirus (COVID-19). Be sure to follow the instructions on the label to ensure safe and effective use of the product. Many products recommend keeping the surface wet with a disinfectant for a certain period of time (look at "contact time" on the product label). You may also need to wear personal protective equipment, such as gloves, depending on the directions on the product label. Immediately after disinfecting, wash your hands with soap and water for 20 seconds. For completed guidance on cleaning and disinfecting your home, visit Complete Disinfection Guidance. Take steps to improve ventilation at home Improve ventilation (air flow)  at home to help prevent from spreading COVID-19 to other people in your household. Clear out COVID-19 virus particles in the air by opening windows, using air filters, and turning on fans in your home. Use this interactive tool to learn how to improve air flow in your home. When you can be around others after being sick with COVID-19 Deciding when you can be around others is different for different situations. Find out when you can safely end home isolation. For any additional questions about your care, contact your healthcare provider or state or local health department. 11/08/2020 Content source: [facility] ([facility]), Division of Viral Diseases This information is not intended to replace advice given to you by your health care provider. Make sure you discuss any questions you have with your health care provider. Document Revised: 12/22/2020 Document Reviewed: 12/22/2020 Elsevier Patient Education  2022 [employer].      If you have been instructed to have an in-person evaluation today at a local Urgent Care facility, please use the link below. It will take you to a list of all of our available [facility], including address, phone number and hours of operation. Please do not delay care.  [facility]  If you or a family member do not have a primary care provider, use the link below to schedule a visit and establish care. When you choose a [facility] primary care physician or advanced practice provider, you gain a long-term partner in health. Find a Primary Care Provider  Learn more about [facility]'s in-office and virtual care options: [facility] - Get Care Now

## 2021-05-15 NOTE — Progress Notes (Signed)
Patient presents for orthotic pick up.  Verbal and written break in and wear instructions given.  Patient will follow up in 4 weeks with Dr if symptoms worsen or fail to improve. 

## 2021-05-15 NOTE — Progress Notes (Signed)
Virtual Visit Consent   Daisy Douglas, you are scheduled for a virtual visit with a [facility] provider today.     Just as with appointments in the office, your consent must be obtained to participate.  Your consent will be active for this visit and any virtual visit you may have with one of our providers in the next 365 days.     If you have a MyChart account, a copy of this consent can be sent to you electronically.  All virtual visits are billed to your insurance company just like a traditional visit in the office.    As this is a virtual visit, video technology does not allow for your provider to perform a traditional examination.  This may limit your provider's ability to fully assess your condition.  If your provider identifies any concerns that need to be evaluated in person or the need to arrange testing (such as labs, EKG, etc.), we will make arrangements to do so.     Although advances in technology are sophisticated, we cannot ensure that it will always work on either your end or our end.  If the connection with a video visit is poor, the visit may have to be switched to a telephone visit.  With either a video or telephone visit, we are not always able to ensure that we have a secure connection.     I need to obtain your verbal consent now.   Are you willing to proceed with your visit today?    Daisy Douglas has provided verbal consent on 05/15/2021 for a virtual visit (video or telephone).   Piedad Climes, [place]   Date: 05/15/2021 7:48 PM   Virtual Visit via Video Note   I, Piedad Climes, connected with  Daisy Douglas  ([id number], 1996-05-05) on 05/15/21 at  7:30 PM EDT by a video-enabled telemedicine application and verified that I am speaking with the correct person using two identifiers.  Location: Patient: Virtual Visit Location Patient: Home Provider: Virtual Visit Location Provider: Home Office   I discussed the limitations of evaluation and management by  telemedicine and the availability of in person appointments. The patient expressed understanding and agreed to proceed.    History of Present Illness: Daisy Douglas is a 25 y.o. who identifies as a female who was assigned female at birth, and is being seen today for COVID-19. Endorses symptoms starting last Friday with significant nasal/head congestion, chest congestion and a frequent dry cough. Noted loss of taste. Endorses taking a test yesterday which came back positive. Notes taste is somewhat improved. Is still having nasal congestion and cough but much improved.  Initially a fever but improved. Some windedness that she has noted even at rest. Denies chest pain. Is having to mouth breathe more. Has not talked to her OB about diagnosis yet. Is using Allegra OTC.   HPI: HPI  Problems:  Patient Active Problem List   Diagnosis Date Noted   Rh negative state in antepartum period 05/02/2021   Supervision of normal first pregnancy, antepartum 03/17/2021   Anxiety 01/20/2019   Vitamin D deficiency 01/20/2019    Allergies: No Known Allergies Medications:  Current Outpatient Medications:    Prenatal Vit-Fe Fumarate-FA (PRENATAL MULTIVITAMIN) TABS tablet, Take 1 tablet by mouth daily at 12 noon., Disp: , Rfl:    ondansetron (ZOFRAN ODT) 8 MG disintegrating tablet, Take 1 tablet (8 mg total) by mouth every 8 (eight) hours as needed for nausea or vomiting., Disp: 60 tablet, Rfl:  1  Observations/Objective: Patient is well-developed, well-nourished in no acute distress.  Resting comfortably at home.  Head is normocephalic, atraumatic.  No labored breathing. Speech is clear and coherent with logical content.  Patient is alert and oriented at baseline.   Assessment and Plan: 1. COVID-19 Milder symptoms. Lower risk of complications - risk score of 2. No antivirals as is pregnant. Supportive measures, OTC medications and Vitamin regimen reviewed. Patient enrolled in COVID monitoring program through  MyChart. Strict ER precautions reviewed. She is to reach out to her OB tomorrow for additional recommendations from their standpoint.   2. Eczema, unspecified type Discussed use of a topical emmollient and non-scented, non-dyed moisturizing lotion. Also recommend hydrocortisone OTC BID with occlusive dressing at night time. Will need to discuss with OB if not improving as stronger Rx steroids can potentially cause adrenal suppression in fetus.   Follow Up Instructions: I discussed the assessment and treatment plan with the patient. The patient was provided an opportunity to ask questions and all were answered. The patient agreed with the plan and demonstrated an understanding of the instructions.  A copy of instructions were sent to the patient via MyChart unless otherwise noted below.   The patient was advised to call back or seek an in-person evaluation if the symptoms worsen or if the condition fails to improve as anticipated.  Time:  I spent 15 minutes with the patient via telehealth technology discussing the above problems/concerns.    Piedad Climes, PA-C

## 2021-05-16 ENCOUNTER — Telehealth: Payer: Self-pay

## 2021-05-16 NOTE — Telephone Encounter (Signed)
Pt calling; is preg; dx'd c Covid; is in the last days of it; rec a cream for eczema?  [phone]

## 2021-05-19 NOTE — Telephone Encounter (Signed)
Pt aware of what to do for eczema.  Still is coughing and sneezing; adv plain robitussin/musinex, claritin/zyrtec.

## 2021-05-30 ENCOUNTER — Encounter: Payer: Managed Care, Other (non HMO) | Admitting: Advanced Practice Midwife

## 2021-06-02 ENCOUNTER — Ambulatory Visit
Admission: RE | Admit: 2021-06-02 | Discharge: 2021-06-02 | Disposition: A | Discharge status: Home / Self Care | Payer: Managed Care, Other (non HMO) | Source: Ambulatory Visit | Attending: Obstetrics and Gynecology | Admitting: Obstetrics and Gynecology

## 2021-06-02 ENCOUNTER — Other Ambulatory Visit: Payer: Self-pay

## 2021-06-02 DIAGNOSIS — Z3402 Encounter for supervision of normal first pregnancy, second trimester: Secondary | ICD-10-CM | POA: Diagnosis present

## 2021-06-05 ENCOUNTER — Encounter: Payer: Managed Care, Other (non HMO) | Admitting: Obstetrics and Gynecology

## 2021-06-06 ENCOUNTER — Encounter: Payer: Self-pay | Admitting: Obstetrics and Gynecology

## 2021-06-06 ENCOUNTER — Ambulatory Visit (INDEPENDENT_AMBULATORY_CARE_PROVIDER_SITE_OTHER): Payer: Managed Care, Other (non HMO) | Admitting: Obstetrics and Gynecology

## 2021-06-06 ENCOUNTER — Other Ambulatory Visit: Payer: Self-pay

## 2021-06-06 VITALS — BP 118/70 | Wt 217.4 lb

## 2021-06-06 DIAGNOSIS — Z3400 Encounter for supervision of normal first pregnancy, unspecified trimester: Secondary | ICD-10-CM

## 2021-06-06 DIAGNOSIS — L309 Dermatitis, unspecified: Secondary | ICD-10-CM

## 2021-06-06 DIAGNOSIS — Z3402 Encounter for supervision of normal first pregnancy, second trimester: Secondary | ICD-10-CM

## 2021-06-06 DIAGNOSIS — Z3A19 19 weeks gestation of pregnancy: Secondary | ICD-10-CM

## 2021-06-06 NOTE — Progress Notes (Signed)
Routine Prenatal Care Visit  Subjective  Daisy Douglas is a 25 y.o. G1P0000 at [redacted]w[redacted]d being seen today for ongoing prenatal care.  She is currently monitored for the following issues for this low-risk pregnancy and has Anxiety; Vitamin D deficiency; Supervision of normal first pregnancy, antepartum; and Rh negative state in antepartum period on their problem list.  ----------------------------------------------------------------------------------- Patient reports no complaints.  Right groin pain recently.  Contractions: Not present. Vag. Bleeding: None.  Movement: Absent. Denies leaking of fluid.  ----------------------------------------------------------------------------------- The following portions of the patient's history were reviewed and updated as appropriate: allergies, current medications, past family history, past medical history, past social history, past surgical history and problem list. Problem list updated.   Objective  Blood pressure 118/70, weight 217 lb 6.4 oz (98.6 kg), last menstrual period 01/21/2021. Pregravid weight 187 lb (84.8 kg) Total Weight Gain 30 lb 6.4 oz (13.8 kg) Urinalysis:      Fetal Status: Fetal Heart Rate (bpm): 145   Movement: Absent     General:  Alert, oriented and cooperative. Patient is in no acute distress.  Skin: Skin is warm and dry. No rash noted.   Cardiovascular: Normal heart rate noted  Respiratory: Normal respiratory effort, no problems with respiration noted  Abdomen: Soft, gravid, appropriate for gestational age. Pain/Pressure: Present     Pelvic:  Cervical exam deferred        Extremities: Normal range of motion.     Mental Status: Normal mood and affect. Normal behavior. Normal judgment and thought content.     Assessment   24 y.o. G1P0000 at [redacted]w[redacted]d by  10/28/2021, by Last Menstrual Period presenting for routine prenatal visit  Plan   pregnancy 1 Problems (from 03/14/21 to present)     Problem Noted Resolved   Rh  negative state in antepartum period 05/02/2021 by Conard Novak, MD No   Supervision of normal first pregnancy, antepartum 03/17/2021 by Mirna Mires, CNM No   Overview Addendum 06/06/2021  5:37 PM by Natale Milch, MD     Nursing Staff Provider  Office Location  [facility] Dating    Language  English Anatomy US   incomplete  Flu Vaccine   Genetic Screen  NIPS: normal XY  TDaP vaccine    Hgb A1C or  GTT Early : Third trimester :   Covid vaccinated   LAB RESULTS   Rhogam   Blood Type   O-  Feeding Plan Breast Antibody  neg  Contraception Condoms Rubella  immune  Circumcision  RPR   reactive with 1:1 ratio and neg T Pallidum  Pediatrician   HBsAg   neg  Support Person Friend: Kayla HIV  NR  Prenatal Classes Discussed Varicella Non immune    GBS  (For PCN allergy, check sensitivities)   BTL Consent     VBAC Consent  Pap  NILM 2022    Hgb Electro      CF      SMA                    Anatomy US incomplete- additional follow up US ordered Referral for eczema which patient reports has worsened during pregnancy. She is using a topical cortisone lotion PRN. Discussed oatmeal baths.   Gestational age appropriate obstetric precautions including but not limited to vaginal bleeding, contractions, leaking of fluid and fetal movement were reviewed in detail with the patient.    Return in about 4 weeks (around 07/04/2021) for ROB in person  after anatomy follow up US.  Natale Milch MD [facility], [facility] 06/06/2021, 5:39 PM

## 2021-06-06 NOTE — Patient Instructions (Addendum)
Perinatal Education office at [phone]   Second Trimester of Pregnancy The second trimester of pregnancy is from week 13 through week 27. This is months 4 through 6 of pregnancy. The second trimester is often a time when you feel your best. Your body has adjusted to being pregnant, and you begin to feel better physically. During the second trimester: Morning sickness has lessened or stopped completely. You may have more energy. You may have an increase in appetite. The second trimester is also a time when the unborn baby (fetus) is growing rapidly. At the end of the sixth month, the fetus may be up to 12 inches long and weigh about 1 pounds. You will likely begin to feel the baby move (quickening) between 16 and 20 weeks of pregnancy. Body changes during your second trimester Your body continues to go through many changes during your second trimester. The changes vary and generally return to normal after the baby is born. Physical changes Your weight will continue to increase. You will notice your lower abdomen bulging out. You may begin to get stretch marks on your hips, abdomen, and breasts. Your breasts will continue to grow and to become tender. Dark spots or blotches (chloasma or mask of pregnancy) may develop on your face. A dark line from your belly button to the pubic area (linea nigra) may appear. You may have changes in your hair. These can include thickening of your hair, rapid growth, and changes in texture. Some people also have hair loss during or after pregnancy, or hair that feels dry or thin. Health changes You may develop headaches. You may have heartburn. You may develop constipation. You may develop hemorrhoids or swollen, bulging veins (varicose veins). Your gums may bleed and may be sensitive to brushing and flossing. You may urinate more often because the fetus is pressing on your bladder. You may have back pain. This is caused by: Weight gain. Pregnancy  hormones that are relaxing the joints in your pelvis. A shift in weight and the muscles that support your balance. Follow these instructions at home: Medicines Follow your health care provider's instructions regarding medicine use. Specific medicines may be either safe or unsafe to take during pregnancy. Do not take any medicines unless approved by your health care provider. Take a prenatal vitamin that contains at least 600 micrograms (mcg) of folic acid. Eating and drinking Eat a healthy diet that includes fresh fruits and vegetables, whole grains, good sources of protein such as meat, eggs, or tofu, and low-fat dairy products. Avoid raw meat and unpasteurized juice, milk, and cheese. These carry germs that can harm you and your baby. You may need to take these actions to prevent or treat constipation: Drink enough fluid to keep your urine pale yellow. Eat foods that are high in fiber, such as beans, whole grains, and fresh fruits and vegetables. Limit foods that are high in fat and processed sugars, such as fried or sweet foods. Activity Exercise only as directed by your health care provider. Most people can continue their usual exercise routine during pregnancy. Try to exercise for 30 minutes at least 5 days a week. Stop exercising if you develop contractions in your uterus. Stop exercising if you develop pain or cramping in the lower abdomen or lower back. Avoid exercising if it is very hot or humid or if you are at a high altitude. Avoid heavy lifting. If you choose to, you may have sex unless your health care provider tells you not to. Relieving  pain and discomfort Wear a supportive bra to prevent discomfort from breast tenderness. Take warm sitz baths to soothe any pain or discomfort caused by hemorrhoids. Use hemorrhoid cream if your health care provider approves. Rest with your legs raised (elevated) if you have leg cramps or low back pain. If you develop varicose veins: Wear  support hose as told by your health care provider. Elevate your feet for 15 minutes, 3-4 times a day. Limit salt in your diet. Safety Wear your seat belt at all times when driving or riding in a car. Talk with your health care provider if someone is verbally or physically abusive to you. Lifestyle Do not use hot tubs, steam rooms, or saunas. Do not douche. Do not use tampons or scented sanitary pads. Avoid cat litter boxes and soil used by cats. These carry germs that can cause birth defects in the baby and possibly loss of the fetus by miscarriage or stillbirth. Do not use herbal remedies, alcohol, illegal drugs, or medicines that are not approved by your health care provider. Chemicals in these products can harm your baby. Do not use any products that contain nicotine or tobacco, such as cigarettes, e-cigarettes, and chewing tobacco. If you need help quitting, ask your health care provider. General instructions During a routine prenatal visit, your health care provider will do a physical exam and other tests. He or she will also discuss your overall health. Keep all follow-up visits. This is important. Ask your health care provider for a referral to a local prenatal education class. Ask for help if you have counseling or nutritional needs during pregnancy. Your health care provider can offer advice or refer you to specialists for help with various needs. Where to find more information American Pregnancy Association: americanpregnancy.org [employer]: https://www.todd-brady.net/ Office on [employer] Health: [employer].nz Contact a health care provider if you have: A headache that does not go away when you take medicine. Vision changes or you see spots in front of your eyes. Mild pelvic cramps, pelvic pressure, or nagging pain in the abdominal area. Persistent nausea, vomiting, or diarrhea. A bad-smelling vaginal discharge or  foul-smelling urine. Pain when you urinate. Sudden or extreme swelling of your face, hands, ankles, feet, or legs. A fever. Get help right away if you: Have fluid leaking from your vagina. Have spotting or bleeding from your vagina. Have severe abdominal cramping or pain. Have difficulty breathing. Have chest pain. Have fainting spells. Have not felt your baby move for the time period told by your health care provider. Have new or increased pain, swelling, or redness in an arm or leg. Summary The second trimester of pregnancy is from week 13 through week 27 (months 4 through 6). Do not use herbal remedies, alcohol, illegal drugs, or medicines that are not approved by your health care provider. Chemicals in these products can harm your baby. Exercise only as directed by your health care provider. Most people can continue their usual exercise routine during pregnancy. Keep all follow-up visits. This is important. This information is not intended to replace advice given to you by your health care provider. Make sure you discuss any questions you have with your health care provider. Document Revised: 01/13/2020 Document Reviewed: 11/19/2019 Elsevier Patient Education  2022 [employer].

## 2021-07-07 ENCOUNTER — Ambulatory Visit: Payer: Managed Care, Other (non HMO) | Admitting: Obstetrics & Gynecology

## 2021-07-10 ENCOUNTER — Ambulatory Visit (INDEPENDENT_AMBULATORY_CARE_PROVIDER_SITE_OTHER): Payer: Managed Care, Other (non HMO) | Admitting: Obstetrics and Gynecology

## 2021-07-10 ENCOUNTER — Other Ambulatory Visit: Payer: Self-pay

## 2021-07-10 ENCOUNTER — Encounter: Payer: Self-pay | Admitting: Obstetrics and Gynecology

## 2021-07-10 VITALS — BP 126/84 | Wt 225.0 lb

## 2021-07-10 DIAGNOSIS — Z3402 Encounter for supervision of normal first pregnancy, second trimester: Secondary | ICD-10-CM

## 2021-07-10 DIAGNOSIS — O99212 Obesity complicating pregnancy, second trimester: Secondary | ICD-10-CM

## 2021-07-10 DIAGNOSIS — Z131 Encounter for screening for diabetes mellitus: Secondary | ICD-10-CM

## 2021-07-10 DIAGNOSIS — Z3A24 24 weeks gestation of pregnancy: Secondary | ICD-10-CM

## 2021-07-10 DIAGNOSIS — O26899 Other specified pregnancy related conditions, unspecified trimester: Secondary | ICD-10-CM

## 2021-07-10 DIAGNOSIS — Z6791 Unspecified blood type, Rh negative: Secondary | ICD-10-CM

## 2021-07-10 DIAGNOSIS — O99213 Obesity complicating pregnancy, third trimester: Secondary | ICD-10-CM | POA: Insufficient documentation

## 2021-07-10 DIAGNOSIS — N760 Acute vaginitis: Secondary | ICD-10-CM

## 2021-07-10 DIAGNOSIS — Z113 Encounter for screening for infections with a predominantly sexual mode of transmission: Secondary | ICD-10-CM

## 2021-07-10 DIAGNOSIS — O99210 Obesity complicating pregnancy, unspecified trimester: Secondary | ICD-10-CM | POA: Insufficient documentation

## 2021-07-10 DIAGNOSIS — B9689 Other specified bacterial agents as the cause of diseases classified elsewhere: Secondary | ICD-10-CM

## 2021-07-10 MED ORDER — METRONIDAZOLE 500 MG PO TABS: 500.0000 mg | ORAL_TABLET | Freq: Two times a day (BID) | ORAL | 0 refills | Status: AC

## 2021-07-10 NOTE — Progress Notes (Signed)
Routine Prenatal Care Visit  Subjective  Daisy Douglas is a 25 y.o. G1P0000 at [redacted]w[redacted]d being seen today for ongoing prenatal care.  She is currently monitored for the following issues for this low-risk pregnancy and has Anxiety; Vitamin D deficiency; Supervision of normal first pregnancy, antepartum; Rh negative state in antepartum period; and Obesity affecting pregnancy on their problem list.  ----------------------------------------------------------------------------------- Patient reports vaginal odor that has continued. She has been treated for BV with clindamycin a couple of months ago.   Contractions: Not present. Vag. Bleeding: None.  Movement: Present. Leaking Fluid denies.  ----------------------------------------------------------------------------------- The following portions of the patient's history were reviewed and updated as appropriate: allergies, current medications, past family history, past medical history, past social history, past surgical history and problem list. Problem list updated.  Objective  Blood pressure 126/84, weight 225 lb (102.1 kg), last menstrual period 01/21/2021. Pregravid weight 187 lb (84.8 kg) Total Weight Gain 38 lb (17.2 kg) Urinalysis: Urine Protein    Urine Glucose    Fetal Status: Fetal Heart Rate (bpm): 147 Fundal Height: 25 cm Movement: Present     General:  Alert, oriented and cooperative. Patient is in no acute distress.  Skin: Skin is warm and dry. No rash noted.   Cardiovascular: Normal heart rate noted  Respiratory: Normal respiratory effort, no problems with respiration noted  Abdomen: Soft, gravid, appropriate for gestational age.       Pelvic:  Cervical exam deferred         Extremities: Normal range of motion.     Mental Status: Normal mood and affect. Normal behavior. Normal judgment and thought content.   Female chaperone present for pelvic exam:   Wet Prep: PH: 5 Clue Cells: Positive Fungal elements: Negative Trichomonas:  Negative   Assessment   25 y.o. G1P0000 at [redacted]w[redacted]d by  10/28/2021, by Last Menstrual Period presenting for routine prenatal visit  Plan   pregnancy 1 Problems (from 03/14/21 to present)     Problem Noted Resolved   Obesity affecting pregnancy 07/10/2021 by Conard Novak, MD No   Rh negative state in antepartum period 05/02/2021 by Conard Novak, MD No   Supervision of normal first pregnancy, antepartum 03/17/2021 by Mirna Mires, CNM No   Overview Addendum 06/06/2021  5:37 PM by Natale Milch, MD     Nursing Staff Provider  Office Location  [facility] Dating    Language  English Anatomy US   incomplete  Flu Vaccine   Genetic Screen  NIPS: normal XY  TDaP vaccine    Hgb A1C or  GTT Early : Third trimester :   Covid vaccinated   LAB RESULTS   Rhogam   Blood Type   O-  Feeding Plan Breast Antibody  neg  Contraception Condoms Rubella  immune  Circumcision  RPR   reactive with 1:1 ratio and neg T Pallidum  Pediatrician   HBsAg   neg  Support Person Friend: Kayla HIV  NR  Prenatal Classes Discussed Varicella Non immune    GBS  (For PCN allergy, check sensitivities)   BTL Consent     VBAC Consent  Pap  NILM 2022    Hgb Electro      CF      SMA                    Preterm labor symptoms and general obstetric precautions including but not limited to vaginal bleeding, contractions, leaking of fluid and fetal movement were reviewed in  detail with the patient. Please refer to After Visit Summary for other counseling recommendations.   Flagyl for BV  Return in about 4 weeks (around 08/07/2021) for 28 wk labs with routine prenatal.   Thomasene Mohair, MD, [facility], [facility] 07/10/2021 4:56 PM

## 2021-07-12 ENCOUNTER — Ambulatory Visit
Admission: RE | Admit: 2021-07-12 | Discharge: 2021-07-12 | Disposition: A | Discharge status: Home / Self Care | Payer: Managed Care, Other (non HMO) | Source: Ambulatory Visit | Attending: Obstetrics and Gynecology | Admitting: Obstetrics and Gynecology

## 2021-07-12 ENCOUNTER — Other Ambulatory Visit: Payer: Self-pay

## 2021-07-12 DIAGNOSIS — Z3689 Encounter for other specified antenatal screening: Secondary | ICD-10-CM | POA: Insufficient documentation

## 2021-07-12 DIAGNOSIS — Z3A19 19 weeks gestation of pregnancy: Secondary | ICD-10-CM

## 2021-07-12 DIAGNOSIS — Z3402 Encounter for supervision of normal first pregnancy, second trimester: Secondary | ICD-10-CM | POA: Diagnosis present

## 2021-07-12 DIAGNOSIS — Z3A24 24 weeks gestation of pregnancy: Secondary | ICD-10-CM | POA: Insufficient documentation

## 2021-07-14 ENCOUNTER — Ambulatory Visit: Payer: Managed Care, Other (non HMO)

## 2021-07-17 ENCOUNTER — Encounter: Payer: Self-pay | Admitting: Obstetrics and Gynecology

## 2021-07-17 ENCOUNTER — Ambulatory Visit (INDEPENDENT_AMBULATORY_CARE_PROVIDER_SITE_OTHER): Payer: Managed Care, Other (non HMO) | Admitting: Obstetrics and Gynecology

## 2021-07-17 ENCOUNTER — Other Ambulatory Visit: Payer: Self-pay

## 2021-07-17 VITALS — BP 128/70 | Ht 64.0 in | Wt 229.8 lb

## 2021-07-17 DIAGNOSIS — Z6791 Unspecified blood type, Rh negative: Secondary | ICD-10-CM

## 2021-07-17 DIAGNOSIS — Z3400 Encounter for supervision of normal first pregnancy, unspecified trimester: Secondary | ICD-10-CM

## 2021-07-17 DIAGNOSIS — O26899 Other specified pregnancy related conditions, unspecified trimester: Secondary | ICD-10-CM

## 2021-07-17 DIAGNOSIS — O99212 Obesity complicating pregnancy, second trimester: Secondary | ICD-10-CM

## 2021-07-17 DIAGNOSIS — Z3A25 25 weeks gestation of pregnancy: Secondary | ICD-10-CM

## 2021-07-17 NOTE — Patient Instructions (Addendum)
[email or web address]   Hyperemesis Gravidarum Hyperemesis gravidarum is a severe form of nausea and vomiting that happens during pregnancy. Hyperemesis is worse than morning sickness. It may cause you to have nausea or vomiting all day for many days. It may keep you from eating and drinking enough food and liquids, which can lead to dehydration, malnutrition, and weight loss. Hyperemesis usually occurs during the first half (the first 20 weeks) of pregnancy. It often goes away once a woman is in her second half of pregnancy. However, sometimes hyperemesis continues through an entire pregnancy. What are the causes? The cause of this condition is not known. It may be associated with: Changes in hormones in the body during pregnancy. Changes in the gastrointestinal system. Genetic or inherited conditions. What are the signs or symptoms? Symptoms of this condition include: Severe nausea and vomiting that does not go away. Problems keeping food down. Weight loss. Loss of body fluid (dehydration). Loss of appetite. You may have no desire to eat or you may not like the food you have previously enjoyed. How is this diagnosed? This condition may be diagnosed based on your medical history, your symptoms, and a physical exam. You may also have other tests, including: Blood tests. Urine tests. Blood pressure tests. Ultrasound to look for problems with the placenta or to check if you are pregnant with more than one baby. How is this treated? This condition is managed by controlling symptoms. This may include: Following an eating plan. This can help to lessen nausea and vomiting. Treatments that do not use medicine. These include acupressure bracelets, hypnosis, and eating or drinking foods or fluids that contain ginger, ginger ale, or ginger tea. Taking prescription medicine or over-the-counter medicine as told by your health care provider. Continuing to take prenatal vitamins. You may need to change what  kind you take and when you take them. Follow your health care provider's instructions about prenatal vitamins. An eating plan and medicines are often used together to help control symptoms. If medicines do not help relieve nausea and vomiting, you may need to receive fluids through an IV at the hospital. Follow these instructions at home: To help relieve your symptoms, listen to your body. Everyone is different and has different preferences. Find what works best for you. Here are some things you can try to help relieve your symptoms: Meals and snacks  Eat 5-6 small meals daily instead of 3 large meals. Eating small meals and snacks can help you avoid an empty stomach. Before getting out of bed, eat a couple of crackers to avoid moving around on an empty stomach. Eat a protein-rich snack before bed. Examples include cheese and crackers, or a peanut butter sandwich made with 1 slice of whole-wheat bread and 1 tsp (5 g) of peanut butter. Eat and drink slowly. Try eating starchy foods as these are usually tolerated well. Examples include cereal, toast, bread, potatoes, pasta, rice, and pretzels. Eat at least one serving of protein with your meals and snacks. Protein options include lean meats, poultry, seafood, beans, nuts, nut butters, eggs, cheese, and yogurt. Eat or suck on things that have ginger in them. It may help to relieve nausea. Add  tsp (0.44 g) ground ginger to hot tea, or choose ginger tea. Fluids It is important to stay hydrated. Try to: Drink small amounts of fluids often. Drink fluids 30 minutes before or after a meal to help lessen the feeling of a full stomach. Drink 100% fruit juice or an electrolyte drink. An  electrolyte drink contains sodium, potassium, and chloride. Drink fluids that are cold, clear, and carbonated or sour. These include lemonade, ginger ale, lemon-lime soda, ice water, and sparkling water. Things to avoid Avoid the following: Eating foods that trigger your  symptoms. These may include spicy foods, coffee, high-fat foods, very sweet foods, and acidic foods. Drinking more than 1 cup of fluid at a time. Skipping meals. Nausea can be more intense on an empty stomach. If you cannot tolerate food, do not force it. Try sucking on ice chips or other frozen items and make up for missed calories later. Lying down within 2 hours after eating. Being exposed to environmental triggers. These may include food smells, smoky rooms, closed spaces, rooms with strong smells, warm or humid places, overly loud and noisy rooms, and rooms with motion or flickering lights. Try eating meals in a well-ventilated area that is free of strong smells. Making quick and sudden changes in your movement. Taking iron pills and multivitamins that contain iron. If you take prescription iron pills, do not stop taking them unless your health care provider approves. Preparing food. The smell of food can spoil your appetite or trigger nausea. General instructions Brush your teeth or use a mouth rinse after meals. Take over-the-counter and prescription medicines only as told by your health care provider. Follow instructions from your health care provider about eating or drinking restrictions. Talk with your health care provider about starting a supplement of vitamin B6. Continue to take your prenatal vitamins as told by your health care provider. If you are having trouble taking your prenatal vitamins, talk with your health care provider about other options. Keep all follow-up visits. This is important. Follow-up visits include prenatal visits. Contact a health care provider if: You have pain in your abdomen. You have a severe headache. You have vision problems. You are losing weight. You feel weak or dizzy. You cannot eat or drink without vomiting, especially if this goes on for a full day. Get help right away if: You cannot drink fluids without vomiting. You vomit blood. You have  constant nausea and vomiting. You are very weak. You faint. You have a fever and your symptoms suddenly get worse. Summary Hyperemesis gravidarum is a severe form of nausea and vomiting that happens during pregnancy. Making some changes to your eating habits may help relieve nausea and vomiting. This condition may be managed with lifestyle changes and medicines as prescribed by your health care provider. If medicines do not help relieve nausea and vomiting, you may need to receive fluids through an IV at the hospital. This information is not intended to replace advice given to you by your health care provider. Make sure you discuss any questions you have with your health care provider. Document Revised: 02/29/2020 Document Reviewed: 02/29/2020 Elsevier Patient Education  2022 Elsevier Inc. Initial steps to help :   B6 (pyridoxine) 25 mg,  3-4 times a day- 200 mg a day total Unisom (doxylamine) 25 mg at bedtime **B6 and Unisom are available as a combination prescription medications called diclegis and bonjesta  B1 (thiamin)  50-100 mg 1-2 a day-  100 mg a day total  Continue prenatal vitamin with iron and thiamin. If it is not tolerated switch to 1 mg of folic acid.  Can add medication for gastric reflux if needed.  Subsequent steps to be added to B1, B6, and Unisom:  Antihistamine (one of the following medications) Dramamine      25-50 mg every 4-6 hours  Benadryl      25-50 mg every 4-6 hours Meclizine      25 mg every 6 hours  2. Dopamine Antagonist (one of the following medications) Metoclopramide  (Reglan)  5-10 mg every 6-8 hours         PO Promethazine   (Phenergan)   12.5-25 mg every 4-6 hours      PO or rectal Prochlorperazine  (Compazine)  5-10 mg every 6-8 hours     25mg  BID rectally   Subsequent steps if there has still not been improvement in symptoms:  3. Daily stool softner:  Colace 100 mg twice a day  4. Ondansetron  (Zofran)   4-8 mg every 6-8 hours

## 2021-07-17 NOTE — Progress Notes (Signed)
Routine Prenatal Care Visit  Subjective  Daisy Douglas is a 25 y.o. G1P0000 at [redacted]w[redacted]d being seen today for ongoing prenatal care.  She is currently monitored for the following issues for this low-risk pregnancy and has Anxiety; Vitamin D deficiency; Supervision of normal first pregnancy, antepartum; Rh negative state in antepartum period; and Obesity affecting pregnancy on their problem list.  ----------------------------------------------------------------------------------- Patient reports  that she has had some improvement in daily vomiting with lollipops . She currently vomits 1-2 times a day. She sometimes uses zofran for nausea. Follow up anatomy views of spine for fetus are still incomplete.  She reports symptoms of BV improved with treatment. Contractions: Not present. Vag. Bleeding: None.  Movement: Present. Denies leaking of fluid.  ----------------------------------------------------------------------------------- The following portions of the patient's history were reviewed and updated as appropriate: allergies, current medications, past family history, past medical history, past social history, past surgical history and problem list. Problem list updated.   Objective  Blood pressure 128/70, height 5\' 4"  (1.626 m), weight 229 lb 12.8 oz (104.2 kg), last menstrual period 01/21/2021. Pregravid weight 187 lb (84.8 kg) Total Weight Gain 42 lb 12.8 oz (19.4 kg) Urinalysis:      Fetal Status: Fetal Heart Rate (bpm): 135 Fundal Height: 25 cm Movement: Present     General:  Alert, oriented and cooperative. Patient is in no acute distress.  Skin: Skin is warm and dry. No rash noted.   Cardiovascular: Normal heart rate noted  Respiratory: Normal respiratory effort, no problems with respiration noted  Abdomen: Soft, gravid, appropriate for gestational age. Pain/Pressure: Absent     Pelvic:  Cervical exam deferred        Extremities: Normal range of motion.     Mental Status: Normal  mood and affect. Normal behavior. Normal judgment and thought content.     Assessment   25 y.o. G1P0000 at [redacted]w[redacted]d by  10/28/2021, by Last Menstrual Period presenting for routine prenatal visit  Plan   pregnancy 1 Problems (from 03/14/21 to present)     Problem Noted Resolved   Obesity affecting pregnancy 07/10/2021 by 07/12/2021, MD No   Rh negative state in antepartum period 05/02/2021 by 05/04/2021, MD No   Supervision of normal first pregnancy, antepartum 03/17/2021 by 03/19/2021, CNM No   Overview Addendum 06/06/2021  5:37 PM by 06/08/2021, MD     Nursing Staff Provider  Office Location  [facility] Dating    Language  English Anatomy Natale Milch   incomplete  Flu Vaccine   Genetic Screen  NIPS: normal XY  TDaP vaccine    Hgb A1C or  GTT Early : Third trimester :   Covid vaccinated   LAB RESULTS   Rhogam   Blood Type   O-  Feeding Plan Breast Antibody  neg  Contraception Condoms Rubella  immune  Circumcision  RPR   reactive with 1:1 ratio and neg T Pallidum  Pediatrician   HBsAg   neg  Support Person Friend: Kayla HIV  NR  Prenatal Classes Discussed Varicella Non immune    GBS  (For PCN allergy, check sensitivities)   BTL Consent     VBAC Consent  Pap  NILM 2022    Hgb Electro      CF      SMA                   MFM reviewed images and feels like radiology follow up is adequate, 2023  follow up orders placed.  Given resources for hyperemesis and further information.  Discussed daily activities from spinning babies to help with fetal positioning and general pregnancy discomforts.  28 week panel is ordered in computer. Discussed shw will need her rhogam shot next visit.  Gestational age appropriate obstetric precautions including but not limited to vaginal bleeding, contractions, leaking of fluid and fetal movement were reviewed in detail with the patient.    Return in about 3 weeks (around 08/07/2021) for ROB and 1 GTT.  Natale Milch  MD [facility], [facility] 07/17/2021, 10:13 PM

## 2021-07-17 NOTE — Progress Notes (Deleted)
    Routine Prenatal Care Visit  Subjective  Daisy Douglas is a 25 y.o. G1P0000 at [redacted]w[redacted]d being seen today for ongoing prenatal care.  She is currently monitored for the following issues for this low-risk pregnancy and has Anxiety; Vitamin D deficiency; Supervision of normal first pregnancy, antepartum; Rh negative state in antepartum period; and Obesity affecting pregnancy on their problem list.  ----------------------------------------------------------------------------------- Patient reports {sx:14538}.    .  .   . Denies leaking of fluid.  ----------------------------------------------------------------------------------- The following portions of the patient's history were reviewed and updated as appropriate: allergies, current medications, past family history, past medical history, past social history, past surgical history and problem list. Problem list updated.   Objective  Blood pressure 128/70, height 5\' 4"  (1.626 m), weight 229 lb 12.8 oz (104.2 kg), last menstrual period 01/21/2021. Pregravid weight 187 lb (84.8 kg) Total Weight Gain 38 lb (17.2 kg) Urinalysis:      Fetal Status:           General:  Alert, oriented and cooperative. Patient is in no acute distress.  Skin: Skin is warm and dry. No rash noted.   Cardiovascular: Normal heart rate noted  Respiratory: Normal respiratory effort, no problems with respiration noted  Abdomen: Soft, gravid, appropriate for gestational age.       Pelvic:  {Blank single:19197::"Cervical exam performed","Cervical exam deferred"}        Extremities: Normal range of motion.     Mental Status: Normal mood and affect. Normal behavior. Normal judgment and thought content.     Assessment   25 y.o. G1P0000 at [redacted]w[redacted]d by  10/28/2021, by Last Menstrual Period presenting for {Blank single:19197::"routine","work-in"} prenatal visit  Plan   pregnancy 1 Problems (from 03/14/21 to present)     Problem Noted Resolved   Obesity affecting  pregnancy 07/10/2021 by 07/12/2021, MD No   Rh negative state in antepartum period 05/02/2021 by 05/04/2021, MD No   Supervision of normal first pregnancy, antepartum 03/17/2021 by 03/19/2021, CNM No   Overview Addendum 06/06/2021  5:37 PM by 06/08/2021, MD     Nursing Staff Provider  Office Location  [facility] Dating    Language  English Anatomy Natale Milch   incomplete  Flu Vaccine   Genetic Screen  NIPS: normal XY  TDaP vaccine    Hgb A1C or  GTT Early : Third trimester :   Covid vaccinated   LAB RESULTS   Rhogam   Blood Type   O-  Feeding Plan Breast Antibody  neg  Contraception Condoms Rubella  immune  Circumcision  RPR   reactive with 1:1 ratio and neg T Pallidum  Pediatrician   HBsAg   neg  Support Person Friend: Kayla HIV  NR  Prenatal Classes Discussed Varicella Non immune    GBS  (For PCN allergy, check sensitivities)   BTL Consent     VBAC Consent  Pap  NILM 2022    Hgb Electro      CF      SMA                    Gestational age appropriate obstetric precautions including but not limited to vaginal bleeding, contractions, leaking of fluid and fetal movement were reviewed in detail with the patient.    Return in about 3 weeks (around 08/07/2021) for ROB and 1 GTT.  08/09/2021 MD [facility], [facility] 07/17/2021, 11:32 AM

## 2021-07-25 ENCOUNTER — Ambulatory Visit
Admission: RE | Admit: 2021-07-25 | Discharge: 2021-07-25 | Disposition: A | Discharge status: Home / Self Care | Payer: Managed Care, Other (non HMO) | Source: Ambulatory Visit | Attending: Obstetrics and Gynecology | Admitting: Obstetrics and Gynecology

## 2021-07-25 ENCOUNTER — Other Ambulatory Visit: Payer: Self-pay

## 2021-07-25 DIAGNOSIS — Z3400 Encounter for supervision of normal first pregnancy, unspecified trimester: Secondary | ICD-10-CM | POA: Diagnosis present

## 2021-07-25 DIAGNOSIS — Z3402 Encounter for supervision of normal first pregnancy, second trimester: Secondary | ICD-10-CM | POA: Diagnosis not present

## 2021-08-07 ENCOUNTER — Other Ambulatory Visit: Payer: Managed Care, Other (non HMO)

## 2021-08-07 ENCOUNTER — Ambulatory Visit (INDEPENDENT_AMBULATORY_CARE_PROVIDER_SITE_OTHER): Payer: Managed Care, Other (non HMO) | Admitting: Obstetrics

## 2021-08-07 ENCOUNTER — Other Ambulatory Visit: Payer: Self-pay

## 2021-08-07 VITALS — BP 118/64 | Wt 230.0 lb

## 2021-08-07 DIAGNOSIS — Z131 Encounter for screening for diabetes mellitus: Secondary | ICD-10-CM

## 2021-08-07 DIAGNOSIS — Z3402 Encounter for supervision of normal first pregnancy, second trimester: Secondary | ICD-10-CM

## 2021-08-07 DIAGNOSIS — Z3A28 28 weeks gestation of pregnancy: Secondary | ICD-10-CM

## 2021-08-07 DIAGNOSIS — O99212 Obesity complicating pregnancy, second trimester: Secondary | ICD-10-CM

## 2021-08-07 DIAGNOSIS — Z113 Encounter for screening for infections with a predominantly sexual mode of transmission: Secondary | ICD-10-CM

## 2021-08-07 DIAGNOSIS — Z3400 Encounter for supervision of normal first pregnancy, unspecified trimester: Secondary | ICD-10-CM

## 2021-08-07 DIAGNOSIS — Z6791 Unspecified blood type, Rh negative: Secondary | ICD-10-CM

## 2021-08-07 LAB — POCT URINALYSIS DIPSTICK OB
Glucose, UA: NEGATIVE
POC,PROTEIN,UA: NEGATIVE

## 2021-08-07 NOTE — Progress Notes (Signed)
Routine Prenatal Care Visit  Subjective  Daisy Douglas is a 25 y.o. G1P0000 at [redacted]w[redacted]d being seen today for ongoing prenatal care.  She is currently monitored for the following issues for this high-risk pregnancy and has Anxiety; Vitamin D deficiency; Supervision of normal first pregnancy, antepartum; Rh negative state in antepartum period; and Obesity affecting pregnancy on their problem list.  ----------------------------------------------------------------------------------- Patient reports no complaints.  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 Contractions: Not present. Vag. Bleeding: None.  Movement: Present. Leaking Fluid denies.  ----------------------------------------------------------------------------------- The following portions of the patient's history were reviewed and updated as appropriate: allergies, current medications, past family history, past medical history, past social history, past surgical history and problem list. Problem list updated.  Objective  Blood pressure 118/64, weight 230 lb (104.3 kg), last menstrual period 01/21/2021. Pregravid weight 187 lb (84.8 kg) Total Weight Gain 43 lb (19.5 kg) Urinalysis: Urine Protein Negative  Urine Glucose Negative  Fetal Status:     Movement: Present     General:  Alert, oriented and cooperative. Patient is in no acute distress.  Skin: Skin is warm and dry. No rash noted.   Cardiovascular: Normal heart rate noted  Respiratory: Normal respiratory effort, no problems with respiration noted  Abdomen: Soft, gravid, appropriate for gestational age. Pain/Pressure: Absent     Pelvic:  Cervical exam deferred        Extremities: Normal range of motion.     Mental Status: Normal mood and affect. Normal behavior. Normal judgment and thought content.   Assessment   25 y.o. G1P0000 at [redacted]w[redacted]d by  10/28/2021, by Last Menstrual Period presenting for routine prenatal visit  Plan   pregnancy 1  Problems (from 03/14/21 to present)    Problem Noted Resolved   Obesity affecting pregnancy 07/10/2021 by Conard Novak, MD No   Rh negative state in antepartum period 05/02/2021 by Conard Novak, MD No   Supervision of normal first pregnancy, antepartum 03/17/2021 by Mirna Mires, CNM No   Overview Addendum 06/06/2021  5:37 PM by Natale Milch, MD     Nursing Staff Provider  Office Location  [facility] Dating    Language  English Anatomy US   incomplete  Flu Vaccine   Genetic Screen  NIPS: normal XY  TDaP vaccine    Hgb A1C or  GTT Early : Third trimester :   Covid vaccinated   LAB RESULTS   Rhogam   Blood Type   O-  Feeding Plan Breast Antibody  neg  Contraception Condoms Rubella  immune  Circumcision  RPR   reactive with 1:1 ratio and neg T Pallidum  Pediatrician   HBsAg   neg  Support Person Friend: Kayla HIV  NR  Prenatal Classes Discussed Varicella Non immune    GBS  (For PCN allergy, check sensitivities)   BTL Consent     VBAC Consent  Pap  NILM 2022    Hgb Electro      CF      SMA                    Preterm labor symptoms and general obstetric precautions including but not limited to vaginal bleeding, contractions, leaking of fluid and fetal movement were reviewed in detail with the patient. Please refer to After Visit Summary for other counseling recommendations.  Having her 28 week labs today. Discussed growth scans at 32 weeks and 36 weeks r/t BMI.  No follow-ups on file.  Imagene Riches, CNM  08/07/2021 10:13 AM

## 2021-08-08 LAB — 28 WEEKS RH-PANEL
Antibody Screen: NEGATIVE
Basophils Absolute: 0 10*3/uL (ref 0.0–0.2)
Basos: 0 %
EOS (ABSOLUTE): 0.1 10*3/uL (ref 0.0–0.4)
Eos: 1 %
Gestational Diabetes Screen: 166 mg/dL — ABNORMAL HIGH (ref 70–139)
HIV Screen 4th Generation wRfx: NONREACTIVE
Hematocrit: 27.2 % — ABNORMAL LOW (ref 34.0–46.6)
Hemoglobin: 9 g/dL — ABNORMAL LOW (ref 11.1–15.9)
Immature Grans (Abs): 0.1 10*3/uL (ref 0.0–0.1)
Immature Granulocytes: 1 %
Lymphocytes Absolute: 1.8 10*3/uL (ref 0.7–3.1)
Lymphs: 24 %
MCH: 26.9 pg (ref 26.6–33.0)
MCHC: 33.1 g/dL (ref 31.5–35.7)
MCV: 81 fL (ref 79–97)
Monocytes Absolute: 0.5 10*3/uL (ref 0.1–0.9)
Monocytes: 7 %
Neutrophils Absolute: 4.8 10*3/uL (ref 1.4–7.0)
Neutrophils: 67 %
Platelets: 197 10*3/uL (ref 150–450)
RBC: 3.35 x10E6/uL — ABNORMAL LOW (ref 3.77–5.28)
RDW: 13.1 % (ref 11.7–15.4)
RPR Ser Ql: NONREACTIVE
WBC: 7.2 10*3/uL (ref 3.4–10.8)

## 2021-08-10 ENCOUNTER — Telehealth: Payer: Self-pay

## 2021-08-10 NOTE — Telephone Encounter (Signed)
Pt calling; had 28wk labs 2d ago; was told she would need a shot after these labs were drawn; also, are we able to see her baby's blood type?  [phone]  Explained we can only know her blood type, not the baby's; explained the reason for the rhogam inj; adv will have schedulers to call and schedule a time for her to come in for rhogam.

## 2021-08-11 ENCOUNTER — Ambulatory Visit (INDEPENDENT_AMBULATORY_CARE_PROVIDER_SITE_OTHER): Payer: Managed Care, Other (non HMO)

## 2021-08-11 ENCOUNTER — Other Ambulatory Visit: Payer: Self-pay

## 2021-08-11 DIAGNOSIS — Z6791 Unspecified blood type, Rh negative: Secondary | ICD-10-CM | POA: Diagnosis not present

## 2021-08-11 DIAGNOSIS — O26899 Other specified pregnancy related conditions, unspecified trimester: Secondary | ICD-10-CM | POA: Diagnosis not present

## 2021-08-11 MED ORDER — RHO D IMMUNE GLOBULIN 1500 UNIT/2ML IJ SOSY
300.0000 ug | PREFILLED_SYRINGE | Freq: Once | INTRAMUSCULAR | Status: AC
  Administered 2021-08-11: 16:00:00 300 ug via INTRAMUSCULAR

## 2021-08-11 NOTE — Telephone Encounter (Signed)
Patient is scheduled for nurse this afternoon

## 2021-08-11 NOTE — Progress Notes (Signed)
Pt here for rhophylac inj which was given IM right glut.  Pt stated it stung.  NDC# [phone]

## 2021-08-20 NOTE — L&D Delivery Note (Signed)
Delivery Note ?Primary OB: [facility] ?Delivery Physician: Annamarie Major, MD ?Gestational Age: Full term ?Antepartum complications: none ?Intrapartum complications: Gestational Diabetes, diet controlled and PROM ? ?A viable Female was delivered via vertex presentation.  ?   VACUUM (flat KIWI) WAS APPLIED X2 FOR MATERNAL EXHAUSTION, BUT DUE TO COPIUS AMOUNTS OF HAIR ON FETAL VTX, HAD POP-OFFS RATHER QUICKLY, SO NOT USED FOR DELIVERY ?Apgars:9 ,9  ?Weight:  pending .   ?Placenta status: spontaneous and Intact.  Cord: 3+ vessels;  with the following complications: nuchal. ? ?Anesthesia:  epidural ?Episiotomy:  none ?Lacerations:  1st ?Suture Repair: 2.0 vicryl ?Est. Blood Loss (mL):   500 ml ? ?Mom to postpartum.  Baby to Couplet care / Skin to Skin. ? ?Annamarie Major, MD, FACOG ?[facility], [facility] ?10/20/2021  4:27 AM ?[phone] ? ?

## 2021-08-22 ENCOUNTER — Observation Stay
Admission: EM | Admit: 2021-08-22 | Discharge: 2021-08-23 | Disposition: A | Discharge status: Home / Self Care | Payer: Managed Care, Other (non HMO) | Attending: Obstetrics & Gynecology | Admitting: Obstetrics & Gynecology

## 2021-08-22 ENCOUNTER — Other Ambulatory Visit: Payer: Self-pay

## 2021-08-22 DIAGNOSIS — R1030 Lower abdominal pain, unspecified: Secondary | ICD-10-CM | POA: Insufficient documentation

## 2021-08-22 DIAGNOSIS — Z3400 Encounter for supervision of normal first pregnancy, unspecified trimester: Secondary | ICD-10-CM

## 2021-08-22 DIAGNOSIS — Z3A30 30 weeks gestation of pregnancy: Secondary | ICD-10-CM | POA: Insufficient documentation

## 2021-08-22 DIAGNOSIS — O4703 False labor before 37 completed weeks of gestation, third trimester: Secondary | ICD-10-CM | POA: Insufficient documentation

## 2021-08-22 DIAGNOSIS — O26899 Other specified pregnancy related conditions, unspecified trimester: Secondary | ICD-10-CM | POA: Diagnosis present

## 2021-08-22 DIAGNOSIS — O26893 Other specified pregnancy related conditions, third trimester: Principal | ICD-10-CM | POA: Insufficient documentation

## 2021-08-22 DIAGNOSIS — O99212 Obesity complicating pregnancy, second trimester: Secondary | ICD-10-CM

## 2021-08-22 DIAGNOSIS — R1032 Left lower quadrant pain: Secondary | ICD-10-CM | POA: Diagnosis present

## 2021-08-22 LAB — WET PREP, GENITAL
Clue Cells Wet Prep HPF POC: NONE SEEN
Sperm: NONE SEEN
Trich, Wet Prep: NONE SEEN
WBC, Wet Prep HPF POC: <10 — AB (ref <10)
Yeast Wet Prep HPF POC: NONE SEEN

## 2021-08-22 LAB — URINALYSIS, ROUTINE W REFLEX MICROSCOPIC
Bilirubin Urine: NEGATIVE
Glucose, UA: NEGATIVE mg/dL
Hgb urine dipstick: NEGATIVE
Ketones, ur: NEGATIVE mg/dL
Leukocytes,Ua: NEGATIVE
Nitrite: NEGATIVE
Protein, ur: NEGATIVE mg/dL
Specific Gravity, Urine: 1.013 (ref 1.005–1.030)
pH: 7 (ref 5.0–8.0)

## 2021-08-22 LAB — FETAL FIBRONECTIN: Fetal Fibronectin: NEGATIVE

## 2021-08-22 MED ORDER — ONDANSETRON HCL 4 MG/2ML IJ SOLN: 4.0000 mg | Freq: Four times a day (QID) | INTRAMUSCULAR | Status: DC | PRN

## 2021-08-22 MED ORDER — ACETAMINOPHEN 325 MG PO TABS: 650.0000 mg | ORAL_TABLET | ORAL | Status: DC | PRN

## 2021-08-22 MED ORDER — LIDOCAINE HCL (PF) 1 % IJ SOLN: 30.0000 mL | INTRAMUSCULAR | Status: DC | PRN

## 2021-08-22 NOTE — OB Triage Note (Signed)
Patient here for abdominal pain and increased fetal movement. States that when she feels the pain in her belly she gets very hot and clammy. She says that baby is kicking her like crazy and that also hurts.

## 2021-08-23 ENCOUNTER — Ambulatory Visit (INDEPENDENT_AMBULATORY_CARE_PROVIDER_SITE_OTHER): Payer: Managed Care, Other (non HMO) | Admitting: Advanced Practice Midwife

## 2021-08-23 VITALS — BP 128/80 | Wt 234.0 lb

## 2021-08-23 DIAGNOSIS — Z3403 Encounter for supervision of normal first pregnancy, third trimester: Secondary | ICD-10-CM

## 2021-08-23 DIAGNOSIS — Z3A30 30 weeks gestation of pregnancy: Secondary | ICD-10-CM | POA: Diagnosis not present

## 2021-08-23 DIAGNOSIS — R1032 Left lower quadrant pain: Secondary | ICD-10-CM

## 2021-08-23 DIAGNOSIS — Z369 Encounter for antenatal screening, unspecified: Secondary | ICD-10-CM

## 2021-08-23 DIAGNOSIS — R1031 Right lower quadrant pain: Secondary | ICD-10-CM | POA: Diagnosis not present

## 2021-08-23 DIAGNOSIS — O26893 Other specified pregnancy related conditions, third trimester: Secondary | ICD-10-CM | POA: Diagnosis not present

## 2021-08-23 DIAGNOSIS — R7309 Other abnormal glucose: Secondary | ICD-10-CM

## 2021-08-23 NOTE — Progress Notes (Signed)
Routine Prenatal Care Visit  Subjective  Daisy Douglas is a 26 y.o. G1P0000 at [redacted]w[redacted]d being seen today for ongoing prenatal care.  She is currently monitored for the following issues for this low-risk pregnancy and has Anxiety; Vitamin D deficiency; Supervision of normal first pregnancy, antepartum; Rh negative state in antepartum period; Obesity affecting pregnancy; and Pregnancy related bilateral lower abdominal pain, antepartum on their problem list.  ----------------------------------------------------------------------------------- Patient reports feeling better after being seen on L&D last night.  She had not heard anything about her 28 wk lab result and assumed she had GDM. Discussed result and that we will do a 3 hr test to diagnose. Her questions are answered regarding the same and the need for growth scans. Contractions: Not present. Vag. Bleeding: None.  Movement: Present. Leaking Fluid denies.  ----------------------------------------------------------------------------------- The following portions of the patient's history were reviewed and updated as appropriate: allergies, current medications, past family history, past medical history, past social history, past surgical history and problem list. Problem list updated.  Objective  Blood pressure 128/80, weight 234 lb (106.1 kg), last menstrual period 01/21/2021. Pregravid weight 187 lb (84.8 kg) Total Weight Gain 47 lb (21.3 kg) Urinalysis: Urine Protein    Urine Glucose    Fetal Status: Fetal Heart Rate (bpm): 139 Fundal Height: 32 cm Movement: Present     General:  Alert, oriented and cooperative. Patient is in no acute distress.  Skin: Skin is warm and dry. No rash noted.   Cardiovascular: Normal heart rate noted  Respiratory: Normal respiratory effort, no problems with respiration noted  Abdomen: Soft, gravid, appropriate for gestational age. Pain/Pressure: Present     Pelvic:  Cervical exam deferred        Extremities:  Normal range of motion.  Edema: None  Mental Status: Normal mood and affect. Normal behavior. Normal judgment and thought content.   Assessment   26 y.o. G1P0000 at [redacted]w[redacted]d by  10/28/2021, by Last Menstrual Period presenting for routine prenatal visit  Plan   pregnancy 1 Problems (from 03/14/21 to present)    Problem Noted Resolved   Obesity affecting pregnancy 07/10/2021 by Conard Novak, MD No   Rh negative state in antepartum period 05/02/2021 by Conard Novak, MD No   Supervision of normal first pregnancy, antepartum 03/17/2021 by Mirna Mires, CNM No   Overview Addendum 06/06/2021  5:37 PM by Natale Milch, MD     Nursing Staff Provider  Office Location  [facility] Dating    Language  English Anatomy US   incomplete  Flu Vaccine   Genetic Screen  NIPS: normal XY  TDaP vaccine    Hgb A1C or  GTT Early : Third trimester :   Covid vaccinated   LAB RESULTS   Rhogam   Blood Type   O-  Feeding Plan Breast Antibody  neg  Contraception Condoms Rubella  immune  Circumcision  RPR   reactive with 1:1 ratio and neg T Pallidum  Pediatrician   HBsAg   neg  Support Person Friend: Kayla HIV  NR  Prenatal Classes Discussed Varicella Non immune    GBS  (For PCN allergy, check sensitivities)   BTL Consent     VBAC Consent  Pap  NILM 2022    Hgb Electro      CF      SMA                 3 hr GTT ordered   Preterm labor symptoms and general obstetric  precautions including but not limited to vaginal bleeding, contractions, leaking of fluid and fetal movement were reviewed in detail with the patient. Please refer to After Visit Summary for other counseling recommendations.   Return in about 2 weeks (around 09/06/2021) for rob in 2 weeks and lab only visit asap for 3 hr gtt.  Tresea Mall, CNM 08/23/2021 12:01 PM

## 2021-08-23 NOTE — Patient Instructions (Signed)
Gestational Diabetes Mellitus, Self-Care When you have gestational diabetes mellitus, you must make sure your blood sugar (glucose) stays at a healthy level. What are the risks? If you do not get treated for this condition, it may cause problems for you andyour unborn baby. For the mother Giving birth to the baby early. Having problems during labor and when giving birth. Needing surgery to give birth to the baby (cesarean delivery). Having problems with blood pressure. Getting this form of diabetes again when pregnant. Getting type 2 diabetes in the future. For the baby Low blood sugar. Bigger body size than is normal. Breathing problems. How to monitor blood sugar Check your blood sugar every day while you are pregnant. Check it as often as told by your doctor. To do this: Wash your hands with soap and water for at least 20 seconds. Prick the side of your finger (not the tip) with the lancet. Use a different finger each time. Gently rub the finger until a small drop of blood appears. Follow instructions that come with your meter for: Putting in the test strip. Putting blood on the strip. Getting the result. Write down your result and any notes. In general, your blood sugar levels should be: 95 mg/dL (5.3 mmol/L) if you have not eaten. 140 mg/dL (7.8 mmol/L) 1 hour after a meal. 120 mg/dL (6.7 mmol/L) 2 hours after a meal. Follow these instructions at home: Medicines Take over-the-counter and prescription medicines only as told by your doctor. If your doctor prescribed insulin or other diabetes medicines: Take them every day. Do not run out of insulin or other medicines. Plan ahead so you always have them. Eating and drinking  Follow instructions from your doctor about eating or drinking restrictions. See a food expert (dietician) to help you create an eating plan that helps control your blood sugar. The foods in this plan will include: Low-fat proteins. Dried beans, nuts, and  whole grain breads, cereals, or pasta. Fresh fruits and vegetables. Low-fat dairy products. Healthy fats. Eat healthy snacks between healthy meals. Drink enough fluid to keep your pee (urine) pale yellow. Keep track of carbs that you eat. To do this: Read food labels. Learn the serving sizes of foods. Follow your sick day plan when you cannot eat or drink normally. Make this plan with your doctor so it is ready to use.  Activity Do exercises as told by your doctor. Exercise for 30 or more minutes a day, or as much as your doctor recommends. To help you control blood sugar levels after a meal: Do 10 minutes of exercise after each meal. Start this exercise 30 minutes after the meal. Talk with your doctor before you start a new exercise. Your doctor may tell you to change your insulin, other medicines, or food. Lifestyle Do not drink alcohol. Do not use any products that contain nicotine or tobacco, such as cigarettes, e-cigarettes, and chewing tobacco. If you need help quitting, ask your doctor. Learn how to deal with stress. If you need help with this, ask your doctor. Body care Stay up to date with your shots (vaccines). Take good care of your teeth. To do this: Brush your teeth and gums two times a day. Floss one or more times a day. Go to the dentist one or more times every 6 months. Stay at a healthy weight while you are pregnant. General instructions Ask your doctor about risks of high blood pressure in pregnancy. Share your diabetes care plan with: Your work or school. People   you live with. Check your pee for ketones: When you are sick. As told by your doctor. Carry a card or wear a bracelet that says you have diabetes. Keep all follow-up visits. Care after giving birth Have your blood sugar checked 4-12 weeks after you give birth. Get checked for diabetes one or more times every 3 years or as told. Where to find more information American Diabetes Association (ADA):  diabetes.org Association of Diabetes Care & Education Specialists (ADCES): diabeteseducator.org Centers for Disease Control and Prevention (CDC): cdc.gov American Pregnancy Association: americanpregnancy.org U.[place]. Department of Agriculture MyPlate: myplate.gov Contact a doctor if: Your blood sugar is above your target for two tests in a row. You have a fever. You are sick for 2 days or more and do not get better. You have either of these problems for more than 6 hours: You vomit every time you eat or drink. You have watery poop (diarrhea). Get help right away if: You cannot think clearly. You have trouble breathing. You have moderate or high ketones in your pee. Blood or abnormal fluid starts to come out of your vagina. You feel your baby is not moving as usual. You start having early contractions. You may feel your belly tighten. You have a very bad headache. These symptoms may be an emergency. Get help right away. Call your local emergency services (911 in the [place] Do not wait to see if the symptoms will go away. Do not drive yourself to the hospital. Summary Check your blood sugar (glucose) while you are pregnant. Check it as often as told by your doctor. Take your insulin and diabetes medicines as told. Have your blood sugar checked 4-12 weeks after you give birth. Keep all follow-up visits. This information is not intended to replace advice given to you by your health care provider. Make sure you discuss any questions you have with your healthcare provider. Document Revised: 01/11/2020 Document Reviewed: 01/11/2020 Elsevier Patient Education  2022 Elsevier Inc.  

## 2021-08-23 NOTE — Discharge Instructions (Addendum)

## 2021-08-23 NOTE — OB Triage Note (Addendum)
Pt discharged home in stable condition. RN provided discharge instructions to patient, including labor precautions and when to come back. RN reviewed follow up care with the patient. Pt verbalized understanding and all questions answered at this time. OBIX down, physical copy of fetal heart rate strip sent to medical records.

## 2021-08-24 ENCOUNTER — Other Ambulatory Visit: Payer: Self-pay

## 2021-08-24 ENCOUNTER — Other Ambulatory Visit: Payer: Managed Care, Other (non HMO)

## 2021-08-24 DIAGNOSIS — Z3A30 30 weeks gestation of pregnancy: Secondary | ICD-10-CM

## 2021-08-24 DIAGNOSIS — Z369 Encounter for antenatal screening, unspecified: Secondary | ICD-10-CM

## 2021-08-24 DIAGNOSIS — Z3403 Encounter for supervision of normal first pregnancy, third trimester: Secondary | ICD-10-CM

## 2021-08-24 DIAGNOSIS — R7309 Other abnormal glucose: Secondary | ICD-10-CM

## 2021-08-24 NOTE — Discharge Summary (Signed)
  See FPN 

## 2021-08-24 NOTE — Final Progress Note (Signed)
Physician Final Progress Note  Patient ID: Daisy Douglas MRN: [id number] DOB/AGE: 09/29/1995 26 y.o.  Admit date: 08/22/2021 Admitting provider: Nadara Mustard, MD Discharge date: 08/24/2021   Admission Diagnoses: Principal Problem:   Pregnancy related bilateral lower abdominal pain, antepartum   30 weeks  Discharge Diagnoses:  Principal Problem:   Pregnancy related bilateral lower abdominal pain, antepartum  30 weeks  Consults: None  Significant Findings/ Diagnostic Studies: Patient presented for evaluation of labor.  Patient had cervical exam by RN and this was reported to me. I reviewed her vital signs and fetal tracing, both of which were reassuring.  Patient was discharge as she was not laboring.  Procedures: A NST procedure was performed with FHR monitoring and a normal baseline established, appropriate time of 20-40 minutes of evaluation, and accels >2 seen w 15x15 characteristics.  Results show a REACTIVE NST.   Discharge Condition: good  Disposition: Discharge disposition: 01-Home or Self Care       Diet: Regular diet  Discharge Activity: Activity as tolerated   Allergies as of 08/23/2021   No Known Allergies      Medication List     ASK your doctor about these medications    ondansetron 8 MG disintegrating tablet Commonly known as: Zofran ODT Take 1 tablet (8 mg total) by mouth every 8 (eight) hours as needed for nausea or vomiting.   prenatal multivitamin Tabs tablet Take 1 tablet by mouth daily at 12 noon.         Total time spent taking care of this patient: TRIAGE  Signed: Letitia Libra 08/24/2021, 8:28 AM

## 2021-08-25 ENCOUNTER — Other Ambulatory Visit: Payer: Self-pay | Admitting: Advanced Practice Midwife

## 2021-08-25 DIAGNOSIS — Z3400 Encounter for supervision of normal first pregnancy, unspecified trimester: Secondary | ICD-10-CM

## 2021-08-25 DIAGNOSIS — O24419 Gestational diabetes mellitus in pregnancy, unspecified control: Secondary | ICD-10-CM | POA: Insufficient documentation

## 2021-08-25 LAB — GESTATIONAL GLUCOSE TOLERANCE
Glucose, Fasting: 90 mg/dL (ref 70–94)
Glucose, GTT - 1 Hour: 165 mg/dL (ref 70–179)
Glucose, GTT - 2 Hour: 155 mg/dL — ABNORMAL HIGH (ref 70–154)
Glucose, GTT - 3 Hour: 146 mg/dL — ABNORMAL HIGH (ref 70–139)

## 2021-08-25 MED ORDER — ACCU-CHEK SOFTCLIX LANCETS MISC: 7 refills | Status: DC

## 2021-08-25 MED ORDER — ONETOUCH VERIO VI STRP: ORAL_STRIP | 12 refills | Status: DC

## 2021-08-25 MED ORDER — ONETOUCH VERIO W/DEVICE KIT: PACK | 0 refills | Status: DC

## 2021-08-25 NOTE — Progress Notes (Signed)
Message to patient regarding gdm, glucometer supplies sent, referral to lifestyles

## 2021-09-01 ENCOUNTER — Encounter: Payer: Managed Care, Other (non HMO) | Attending: Advanced Practice Midwife | Admitting: *Deleted

## 2021-09-01 ENCOUNTER — Encounter: Payer: Self-pay | Admitting: *Deleted

## 2021-09-01 ENCOUNTER — Other Ambulatory Visit: Payer: Self-pay

## 2021-09-01 VITALS — BP 118/80 | Ht 64.0 in | Wt 230.4 lb

## 2021-09-01 DIAGNOSIS — O24419 Gestational diabetes mellitus in pregnancy, unspecified control: Secondary | ICD-10-CM | POA: Diagnosis present

## 2021-09-01 DIAGNOSIS — Z3A00 Weeks of gestation of pregnancy not specified: Secondary | ICD-10-CM | POA: Diagnosis not present

## 2021-09-01 DIAGNOSIS — O24410 Gestational diabetes mellitus in pregnancy, diet controlled: Secondary | ICD-10-CM

## 2021-09-01 NOTE — Progress Notes (Signed)
Diabetes Self-Management Education  Visit Type: First/Initial  Appt. Start Time: 1320 Appt. End Time: 1455  09/01/2021  [name], identified by name and date of birth, is a 26 y.o. female with a diagnosis of Diabetes: Gestational Diabetes.   ASSESSMENT  Blood pressure 118/80, height 5\' 4"  (1.626 m), weight 230 lb 6.4 oz (104.5 kg), last menstrual period 01/21/2021, estimated date of delivery 10/28/2021 Body mass index is 39.55 kg/m.   Diabetes Self-Management Education - 09/01/21 1424       Visit Information   Visit Type First/Initial      Initial Visit   Diabetes Type Gestational Diabetes    Are you currently following a meal plan? Yes    What type of meal plan do you follow? "I try to drink more smoothies, eat less carbs"    Are you taking your medications as prescribed? Yes    Date Diagnosed 2 weeks      Health Coping   How would you rate your overall health? Poor      Psychosocial Assessment   Patient Belief/Attitude about Diabetes Other (comment)   "a little emotional"   Self-care barriers None    Self-management support Doctor's office    Patient Concerns Nutrition/Meal planning;Weight Control;Glycemic Control;Healthy Lifestyle    Special Needs None    Preferred Learning Style Visual;Hands on    Learning Readiness Ready    How often do you need to have someone help you when you read instructions, pamphlets, or other written materials from your doctor or pharmacy? 1 - Never    What is the last grade level you completed in school? high school      Pre-Education Assessment   Patient understands the diabetes disease and treatment process. Needs Instruction    Patient understands incorporating nutritional management into lifestyle. Needs Instruction    Patient undertands incorporating physical activity into lifestyle. Needs Instruction    Patient understands using medications safely. Needs Instruction    Patient understands monitoring blood glucose,  interpreting and using results Needs Instruction    Patient understands prevention, detection, and treatment of acute complications. Needs Instruction    Patient understands prevention, detection, and treatment of chronic complications. Needs Instruction    Patient understands how to develop strategies to address psychosocial issues. Needs Instruction    Patient understands how to develop strategies to promote health/change behavior. Needs Instruction      Complications   Last HgB A1C per patient/outside source 5.6 %   2021  she was 5.8% in 2021   How often do you check your blood sugar? 0 times/day (not testing)   Pt will pick her glucometer up today from pharmacy. Instructed her on general use of glucometers and lancet devices. BG in the office was 85 mg/dL at 2022 pm - 4 1/2 hrs pp.   Have you had a dilated eye exam in the past 12 months? No    Have you had a dental exam in the past 12 months? Yes    Are you checking your feet? No      Dietary Intake   Breakfast eggs, toast, sausage or bacon, hashbrown; 8:14 yogurt with granola bar; cereal and milk    Snack (morning) reports 2-3 snacks/day - fruit (grapes, apple, orange)    Lunch ham or [place] sandwich with lettuce tomato and sometimes chips    Dinner chicken, beef, potatoes, corn, rice, pasta, broccoli, brussel sprouts; salads with crotons, bacon bits, ranch dressing - sometimes carrots, tomatoes, cuccumbers  Beverage(s) water, juice      Exercise   Exercise Type Light (walking / raking leaves)    How many days per week to you exercise? 1.5    How many minutes per day do you exercise? 20    Total minutes per week of exercise 30      Patient Education   Previous Diabetes Education Yes (please comment)   seen in our clinic for weight loss and pre-diabetes   Disease state  Definition of diabetes, type 1 and 2, and the diagnosis of diabetes;Factors that contribute to the development of diabetes    Nutrition management  Role of diet in  the treatment of diabetes and the relationship between the three main macronutrients and blood glucose level;Food label reading, portion sizes and measuring food.;Reviewed blood glucose goals for pre and post meals and how to evaluate the patients' food intake on their blood glucose level.    Physical activity and exercise  Role of exercise on diabetes management, blood pressure control and cardiac health.    Medications Other (comment)   Limited use of oral medications during pregnancy and potential for insulin.   Monitoring Taught/evaluated SMBG meter.;Purpose and frequency of SMBG.;Taught/discussed recording of test results and interpretation of SMBG.;Identified appropriate SMBG and/or A1C goals.;Ketone testing, when, how.    Chronic complications Relationship between chronic complications and blood glucose control    Psychosocial adjustment Identified and addressed patients feelings and concerns about diabetes    Preconception care Pregnancy and GDM  Role of pre-pregnancy blood glucose control on the development of the fetus;Reviewed with patient blood glucose goals with pregnancy;Role of family planning for patients with diabetes      Individualized Goals (developed by patient)   Reducing Risk Other (comment)   improve blood sugars, lose weight, lead a healthier lifestyle     Outcomes   Expected Outcomes Demonstrated interest in learning. Expect positive outcomes        Individualized Plan for Diabetes Self-Management Training:   Learning Objective:  Patient will have a greater understanding of diabetes self-management. Patient education plan is to attend individual and/or group sessions per assessed needs and concerns.   Plan:   Patient Instructions  Read booklet on Gestational Diabetes Follow Gestational Meal Planning Guidelines Avoid cold cereal for breakfast Avoid fruit juices Include 1 serving of protein when eating fruit for a snack Complete a 3 Day Food Record and bring to  next appointment Check blood sugars 4 x day - before breakfast and 2 hrs after every meal and record  Bring blood sugar log to all appointments Purchase urine ketone strips if instructed by MD and check urine ketones every am:  If + increase bedtime snack to 1 protein and 2 carbohydrate servings Walk 20-30 minutes at least 5 x week if permitted by MD  Expected Outcomes:  Demonstrated interest in learning. Expect positive outcomes  Education material provided:  Gestational Booklet Gestational Meal Planning Guidelines Simple Meal Plan Viewed Gestational Diabetes Video 3 Day Food Record Goals for a Healthy Pregnancy   If problems or questions, patient to contact team via:   Sharion Settler, RN, CCM, CDCES [phone]  Future DSME appointment: September 06, 2021 with the dietitian

## 2021-09-01 NOTE — Patient Instructions (Signed)
Read booklet on Gestational Diabetes Follow Gestational Meal Planning Guidelines Avoid cold cereal for breakfast Avoid fruit juices Include 1 serving of protein when eating fruit for a snack Complete a 3 Day Food Record and bring to next appointment Check blood sugars 4 x day - before breakfast and 2 hrs after every meal and record  Bring blood sugar log to all appointments Purchase urine ketone strips if instructed by MD and check urine ketones every am:  If + increase bedtime snack to 1 protein and 2 carbohydrate servings Walk 20-30 minutes at least 5 x week if permitted by MD

## 2021-09-04 ENCOUNTER — Ambulatory Visit
Admission: RE | Admit: 2021-09-04 | Discharge: 2021-09-04 | Disposition: A | Discharge status: Home / Self Care | Payer: Managed Care, Other (non HMO) | Source: Ambulatory Visit | Attending: Obstetrics | Admitting: Obstetrics

## 2021-09-04 DIAGNOSIS — O99213 Obesity complicating pregnancy, third trimester: Secondary | ICD-10-CM | POA: Diagnosis present

## 2021-09-04 DIAGNOSIS — O99212 Obesity complicating pregnancy, second trimester: Secondary | ICD-10-CM

## 2021-09-04 DIAGNOSIS — Z3400 Encounter for supervision of normal first pregnancy, unspecified trimester: Secondary | ICD-10-CM

## 2021-09-04 DIAGNOSIS — Z3A33 33 weeks gestation of pregnancy: Secondary | ICD-10-CM | POA: Insufficient documentation

## 2021-09-06 ENCOUNTER — Encounter: Payer: Managed Care, Other (non HMO) | Admitting: Dietician

## 2021-09-06 ENCOUNTER — Encounter: Payer: Self-pay | Admitting: Dietician

## 2021-09-06 ENCOUNTER — Other Ambulatory Visit: Payer: Self-pay

## 2021-09-06 VITALS — BP 120/80 | Ht 64.0 in | Wt 231.5 lb

## 2021-09-06 DIAGNOSIS — O24410 Gestational diabetes mellitus in pregnancy, diet controlled: Secondary | ICD-10-CM

## 2021-09-06 DIAGNOSIS — O24419 Gestational diabetes mellitus in pregnancy, unspecified control: Secondary | ICD-10-CM | POA: Diagnosis not present

## 2021-09-06 NOTE — Patient Instructions (Addendum)
Great job working on [employer] and prep! Keep it up! If you feel low sugar symptoms, shaky, week, slightly dizzy -- drink 4oz regular juice or soda or sweet tea -- allow 15 minutes to improve symptoms, then eat a snack or meal with protein + healthy carbs.  Try a "light" juice drink or mix 100% juice with a sugar free orange drink such as Nature's Twist sugar free orangeade or Crystal light.

## 2021-09-06 NOTE — Progress Notes (Signed)
Patient reports BG results ranging 70s-100 fasting, and post-meal BGs ranging 70s-129. She has not been able to test in past 1-2 days, as she is waiting on higher-gauge lancets.  Patient's food diary indicate she has been working to improve nutritional balance in meals and make healthy food choices. She is eating at regular intervals, except when busy at work.  She has noticed some mild hypoglycemic symptoms when delaying a meal or snack due to work. Instructed patient on when and how to treat low BGs Provided basic balanced meal plan, and wrote individualized menus based on patient's food preferences. Discussed use of WIC approved food items including cereal and juice.  Instructed patient on food safety, including avoidance of Listeriosis, and limiting mercury from fish. Discussed importance of maintaining healthy lifestyle habits to reduce risk of Type 2 DM as well as Gestational DM with any future pregnancies. Advised patient to use any remaining testing supplies to test some BGs after delivery, and to have BG tested ideally annually, as well as prior to attempting future pregnancies.

## 2021-09-07 ENCOUNTER — Ambulatory Visit (INDEPENDENT_AMBULATORY_CARE_PROVIDER_SITE_OTHER): Payer: Managed Care, Other (non HMO) | Admitting: Advanced Practice Midwife

## 2021-09-07 ENCOUNTER — Encounter: Payer: Self-pay | Admitting: Advanced Practice Midwife

## 2021-09-07 VITALS — BP 118/62 | Wt 233.0 lb

## 2021-09-07 DIAGNOSIS — Z369 Encounter for antenatal screening, unspecified: Secondary | ICD-10-CM

## 2021-09-07 DIAGNOSIS — Z3403 Encounter for supervision of normal first pregnancy, third trimester: Secondary | ICD-10-CM

## 2021-09-07 DIAGNOSIS — Z3A32 32 weeks gestation of pregnancy: Secondary | ICD-10-CM

## 2021-09-07 DIAGNOSIS — O24419 Gestational diabetes mellitus in pregnancy, unspecified control: Secondary | ICD-10-CM

## 2021-09-07 LAB — POCT URINALYSIS DIPSTICK OB
Glucose, UA: NEGATIVE
POC,PROTEIN,UA: NEGATIVE

## 2021-09-07 NOTE — Progress Notes (Signed)
Routine Prenatal Care Visit  Subjective  Daisy Douglas is a 26 y.o. G1P0000 at [redacted]w[redacted]d being seen today for ongoing prenatal care.  She is currently monitored for the following issues for this low-risk pregnancy and has Anxiety; Vitamin D deficiency; Supervision of normal first pregnancy, antepartum; Rh negative state in antepartum period; Obesity affecting pregnancy; Pregnancy related bilateral lower abdominal pain, antepartum; and Gestational diabetes mellitus (GDM) affecting first pregnancy on their problem list.  ----------------------------------------------------------------------------------- Patient reports no complaints. She has completed diabetes education and so far has 3 days of blood sugar log- primarily normal values.  Contractions: Not present. Vag. Bleeding: None.  Movement: Present. Leaking Fluid denies.  ----------------------------------------------------------------------------------- The following portions of the patient's history were reviewed and updated as appropriate: allergies, current medications, past family history, past medical history, past social history, past surgical history and problem list. Problem list updated.  Objective  Blood pressure 118/62, weight 233 lb (105.7 kg), last menstrual period 01/21/2021. Pregravid weight 187 lb (84.8 kg) Total Weight Gain 46 lb (20.9 kg) Urinalysis: Urine Protein Negative  Urine Glucose Negative   BS log: fasting 1/3 elevated (103), all normal after meals  Growth scan 3 days ago: 67.7%, 2130 g, AFI 21 cm  Fetal Status: Fetal Heart Rate (bpm): 141 Fundal Height: 34 cm Movement: Present     General:  Alert, oriented and cooperative. Patient is in no acute distress.  Skin: Skin is warm and dry. No rash noted.   Cardiovascular: Normal heart rate noted  Respiratory: Normal respiratory effort, no problems with respiration noted  Abdomen: Soft, gravid, appropriate for gestational age. Pain/Pressure: Absent     Pelvic:  Cervical  exam deferred        Extremities: Normal range of motion.  Edema: None  Mental Status: Normal mood and affect. Normal behavior. Normal judgment and thought content.   Assessment   26 y.o. G1P0000 at [redacted]w[redacted]d by  10/28/2021, by Last Menstrual Period presenting for routine prenatal visit  Plan   pregnancy 1 Problems (from 03/14/21 to present)    Problem Noted Resolved   Gestational diabetes mellitus (GDM) affecting first pregnancy 08/25/2021 by Tresea Mall, CNM No   Obesity affecting pregnancy 07/10/2021 by Conard Novak, MD No   Rh negative state in antepartum period 05/02/2021 by Conard Novak, MD No   Supervision of normal first pregnancy, antepartum 03/17/2021 by Mirna Mires, CNM No   Overview Addendum 06/06/2021  5:37 PM by Natale Milch, MD     Nursing Staff Provider  Office Location  [facility] Dating    Language  English Anatomy US   incomplete  Flu Vaccine   Genetic Screen  NIPS: normal XY  TDaP vaccine    Hgb A1C or  GTT Early : Third trimester :   Covid vaccinated   LAB RESULTS   Rhogam   Blood Type   O-  Feeding Plan Breast Antibody  neg  Contraception Condoms Rubella  immune  Circumcision  RPR   reactive with 1:1 ratio and neg T Pallidum  Pediatrician   HBsAg   neg  Support Person Friend: Kayla HIV  NR  Prenatal Classes Discussed Varicella Non immune    GBS  (For PCN allergy, check sensitivities)   BTL Consent     VBAC Consent  Pap  NILM 2022    Hgb Electro      CF      SMA  Preterm labor symptoms and general obstetric precautions including but not limited to vaginal bleeding, contractions, leaking of fluid and fetal movement were reviewed in detail with the patient.  GDM: follow diet/exercise education recommendations, check BS 4 times daily, keep log, bring log to visits  Return in about 2 weeks (around 09/21/2021) for rob with MD/growth scan in 4 weeks and rob after.  Tresea Mall, CNM 09/07/2021 10:07 AM

## 2021-09-20 ENCOUNTER — Encounter: Payer: Managed Care, Other (non HMO) | Admitting: Obstetrics and Gynecology

## 2021-09-22 ENCOUNTER — Ambulatory Visit (INDEPENDENT_AMBULATORY_CARE_PROVIDER_SITE_OTHER): Payer: Managed Care, Other (non HMO) | Admitting: Obstetrics & Gynecology

## 2021-09-22 ENCOUNTER — Encounter: Payer: Self-pay | Admitting: Obstetrics & Gynecology

## 2021-09-22 ENCOUNTER — Other Ambulatory Visit: Payer: Self-pay

## 2021-09-22 VITALS — BP 120/80 | Wt 234.0 lb

## 2021-09-22 DIAGNOSIS — Z3A34 34 weeks gestation of pregnancy: Secondary | ICD-10-CM

## 2021-09-22 DIAGNOSIS — O24419 Gestational diabetes mellitus in pregnancy, unspecified control: Secondary | ICD-10-CM

## 2021-09-22 DIAGNOSIS — Z3403 Encounter for supervision of normal first pregnancy, third trimester: Secondary | ICD-10-CM

## 2021-09-22 DIAGNOSIS — O99213 Obesity complicating pregnancy, third trimester: Secondary | ICD-10-CM

## 2021-09-22 LAB — POCT URINALYSIS DIPSTICK OB
Glucose, UA: NEGATIVE
POC,PROTEIN,UA: NEGATIVE

## 2021-09-22 NOTE — Addendum Note (Signed)
Addended by: Quintella Baton D on: 09/22/2021 10:57 AM   Modules accepted: Orders

## 2021-09-22 NOTE — Patient Instructions (Signed)

## 2021-09-22 NOTE — Progress Notes (Signed)
°  Subjective  Fetal Movement? yes Contractions? no Leaking Fluid? no Vaginal Bleeding? no FBS mostly normal 2hr PPBS mostly normal  Objective  BP 120/80    Wt 234 lb (106.1 kg)    LMP 01/21/2021 (Exact Date)    BMI 40.17 kg/m  General: NAD Pumonary: no increased work of breathing Abdomen: gravid, non-tender Extremities: no edema Psychiatric: mood appropriate, affect full  Assessment  26 y.o. G1P0000 at [redacted]w[redacted]d by  10/28/2021, by Last Menstrual Period presenting for routine prenatal visit  Plan   Problem List Items Addressed This Visit      Endocrine   Gestational diabetes mellitus (GDM) affecting first pregnancy     Other   Obesity affecting pregnancy  Other Visit Diagnoses    Encounter for supervision of normal first pregnancy in third trimester    -  Primary   [redacted] weeks gestation of pregnancy        Cont to monitor BS log Diest discussed PNV, FMC, PTL precautions discussed [place] 2 weeks GBS nv  pregnancy 1 Problems (from 03/14/21 to present)    Problem Noted Resolved   Gestational diabetes mellitus (GDM) affecting first pregnancy 08/25/2021 by Tresea Mall, CNM No   Obesity affecting pregnancy 07/10/2021 by Conard Novak, MD No   Rh negative state in antepartum period 05/02/2021 by Conard Novak, MD No   Supervision of normal first pregnancy, antepartum 03/17/2021 by Mirna Mires, CNM No   Overview Addendum 06/06/2021  5:37 PM by Natale Milch, MD     Nursing Staff Provider  Office Location  [facility] Dating    Language  English Anatomy US   incomplete  Flu Vaccine   Genetic Screen  NIPS: normal XY  TDaP vaccine    Hgb A1C or  GTT Early : Third trimester :   Covid vaccinated   LAB RESULTS   Rhogam   Blood Type   O-  Feeding Plan Breast Antibody  neg  Contraception Condoms Rubella  immune  Circumcision  RPR   reactive with 1:1 ratio and neg T Pallidum  Pediatrician   HBsAg   neg  Support Person Friend: Kayla HIV  NR  Prenatal Classes  Discussed Varicella Non immune    GBS  (For PCN allergy, check sensitivities)   BTL Consent     VBAC Consent  Pap  NILM 2022    Hgb Electro      CF      SMA                    Annamarie Major, MD, [facility], [facility] 09/22/2021  10:54 AM

## 2021-10-02 ENCOUNTER — Telehealth: Payer: Self-pay

## 2021-10-02 ENCOUNTER — Observation Stay
Admission: EM | Admit: 2021-10-02 | Discharge: 2021-10-02 | Disposition: A | Discharge status: Home / Self Care | Payer: Managed Care, Other (non HMO) | Attending: Obstetrics and Gynecology | Admitting: Obstetrics and Gynecology

## 2021-10-02 ENCOUNTER — Encounter: Payer: Self-pay | Admitting: Obstetrics and Gynecology

## 2021-10-02 ENCOUNTER — Other Ambulatory Visit: Payer: Self-pay

## 2021-10-02 DIAGNOSIS — Z3400 Encounter for supervision of normal first pregnancy, unspecified trimester: Secondary | ICD-10-CM

## 2021-10-02 DIAGNOSIS — O24419 Gestational diabetes mellitus in pregnancy, unspecified control: Secondary | ICD-10-CM

## 2021-10-02 DIAGNOSIS — O26899 Other specified pregnancy related conditions, unspecified trimester: Secondary | ICD-10-CM

## 2021-10-02 DIAGNOSIS — R109 Unspecified abdominal pain: Secondary | ICD-10-CM | POA: Insufficient documentation

## 2021-10-02 DIAGNOSIS — Z3A36 36 weeks gestation of pregnancy: Secondary | ICD-10-CM | POA: Insufficient documentation

## 2021-10-02 DIAGNOSIS — O99213 Obesity complicating pregnancy, third trimester: Secondary | ICD-10-CM

## 2021-10-02 DIAGNOSIS — O26893 Other specified pregnancy related conditions, third trimester: Secondary | ICD-10-CM | POA: Diagnosis present

## 2021-10-02 DIAGNOSIS — Z3490 Encounter for supervision of normal pregnancy, unspecified, unspecified trimester: Secondary | ICD-10-CM

## 2021-10-02 DIAGNOSIS — Z6791 Unspecified blood type, Rh negative: Secondary | ICD-10-CM

## 2021-10-02 NOTE — Progress Notes (Signed)
°   10/02/21 0256  Fetal Heart Rate A  Mode External  Baseline Rate (A) 1353 bpm  Variability 6-25 BPM  Accelerations 15 x 15  Decelerations None  Uterine Activity  Mode Toco  Contraction Frequency (min) ctx x2 with UI  Contraction Duration (sec) 70-80  Contraction Quality Mild  Resting Tone Palpated Relaxed  Resting Time Adequate   Reactive NST

## 2021-10-02 NOTE — Telephone Encounter (Signed)
Spoke w/patient. She works at [employer]. 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 Has to leave to area to get ice to cool herself off d/t heat. Requesting work note with lifting restrictions and able to take breaks to stay hydrated appropriately. Advised will send in my chart.

## 2021-10-02 NOTE — OB Triage Note (Signed)
Pt Daisy Douglas 26 y.o. presents to labor and delivery triage reporting lower abdominal pain. Pt is a G1P0000 at [redacted]w[redacted]d . Pt denies signs and symptoms consistent with rupture of membranes or active vaginal bleeding. Pt denies contractions and states positive fetal movement. External FM and TOCO applied to non-tender abdomen and assessing. Initial FHR 135. Vital signs obtained and within normal limits. Provider notified of pt.

## 2021-10-02 NOTE — Telephone Encounter (Signed)
Patient is 36 weeks requesting note for limitations/retrictions. She is starting to get fatigued with what she is doing. VQ#[phone].

## 2021-10-02 NOTE — OB Triage Note (Signed)

## 2021-10-03 NOTE — Telephone Encounter (Signed)
Pt calling; pt would like letter to be a little more specific  -  more frequent breaks, no heavy lifting, breaks to cool off, time to go to break room to  hydrate b/c she is unable to keep drink at workstation, unable to go pee until she has coverage which isn't good b/c she has to go a lot and she isn't comfortable closing at night at this time.  [phone]

## 2021-10-05 ENCOUNTER — Ambulatory Visit (INDEPENDENT_AMBULATORY_CARE_PROVIDER_SITE_OTHER): Payer: Managed Care, Other (non HMO)

## 2021-10-05 ENCOUNTER — Other Ambulatory Visit: Payer: Self-pay

## 2021-10-05 DIAGNOSIS — O24419 Gestational diabetes mellitus in pregnancy, unspecified control: Secondary | ICD-10-CM

## 2021-10-05 DIAGNOSIS — Z369 Encounter for antenatal screening, unspecified: Secondary | ICD-10-CM

## 2021-10-05 DIAGNOSIS — Z3403 Encounter for supervision of normal first pregnancy, third trimester: Secondary | ICD-10-CM

## 2021-10-06 ENCOUNTER — Other Ambulatory Visit (HOSPITAL_COMMUNITY)
Admission: RE | Admit: 2021-10-06 | Discharge: 2021-10-06 | Disposition: A | Discharge status: Home / Self Care | Payer: Managed Care, Other (non HMO) | Source: Ambulatory Visit | Attending: Advanced Practice Midwife | Admitting: Advanced Practice Midwife

## 2021-10-06 ENCOUNTER — Ambulatory Visit (INDEPENDENT_AMBULATORY_CARE_PROVIDER_SITE_OTHER): Payer: Managed Care, Other (non HMO) | Admitting: Advanced Practice Midwife

## 2021-10-06 ENCOUNTER — Encounter: Payer: Self-pay | Admitting: Advanced Practice Midwife

## 2021-10-06 VITALS — BP 120/80 | Wt 238.0 lb

## 2021-10-06 DIAGNOSIS — Z113 Encounter for screening for infections with a predominantly sexual mode of transmission: Secondary | ICD-10-CM | POA: Diagnosis present

## 2021-10-06 DIAGNOSIS — Z3A36 36 weeks gestation of pregnancy: Secondary | ICD-10-CM | POA: Diagnosis present

## 2021-10-06 DIAGNOSIS — O0993 Supervision of high risk pregnancy, unspecified, third trimester: Secondary | ICD-10-CM

## 2021-10-06 DIAGNOSIS — Z3685 Encounter for antenatal screening for Streptococcus B: Secondary | ICD-10-CM

## 2021-10-06 DIAGNOSIS — Z369 Encounter for antenatal screening, unspecified: Secondary | ICD-10-CM | POA: Insufficient documentation

## 2021-10-06 DIAGNOSIS — O24419 Gestational diabetes mellitus in pregnancy, unspecified control: Secondary | ICD-10-CM

## 2021-10-06 NOTE — Progress Notes (Signed)
Routine Prenatal Care Visit  Subjective  Daisy Douglas is a 26 y.o. G1P0000 at 100w6d being seen today for ongoing prenatal care.  She is currently monitored for the following issues for this high-risk pregnancy and has Anxiety; Vitamin D deficiency; Supervision of high-risk pregnancy; Rh negative state in antepartum period; Obesity affecting pregnancy; Pregnancy related bilateral lower abdominal pain, antepartum; Gestational diabetes mellitus (GDM) affecting first pregnancy; and Pregnancy on their problem list.  ----------------------------------------------------------------------------------- Patient reports heartburn.  Comfort measures reviewed Contractions: Not present. Vag. Bleeding: None.  Movement: Present. Leaking Fluid denies.  ----------------------------------------------------------------------------------- The following portions of the patient's history were reviewed and updated as appropriate: allergies, current medications, past family history, past medical history, past social history, past surgical history and problem list. Problem list updated.  Objective  Blood pressure 120/80, weight 238 lb (108 kg), last menstrual period 01/21/2021. Pregravid weight 187 lb (84.8 kg) Total Weight Gain 51 lb (23.1 kg) Urinalysis: Urine Protein    Urine Glucose     BS log: she reports being unable to take time at work to check BS until now due to change of job position. Fasting are normal and only 2 after breakfast readings- 1 of which is normal and 1 elevated at 140's.  Fetal Status: Fetal Heart Rate (bpm): 140 Fundal Height: 38 cm Movement: Present     Growth scan yesterday: 42.3%, AC 53%, 6#6oz, AFI; 14.34 cm  General:  Alert, oriented and cooperative. Patient is in no acute distress.  Skin: Skin is warm and dry. No rash noted.   Cardiovascular: Normal heart rate noted  Respiratory: Normal respiratory effort, no problems with respiration noted  Abdomen: Soft, gravid, appropriate for  gestational age. Pain/Pressure: Present     Pelvic:  Cervical exam performed Dilation: Fingertip Effacement (%): 50 Station: -3, posterior,  GBS/aptima collected  Extremities: Normal range of motion.  Edema: None  Mental Status: Normal mood and affect. Normal behavior. Normal judgment and thought content.   Assessment   26 y.o. G1P0000 at [redacted]w[redacted]d by  10/28/2021, by Last Menstrual Period presenting for routine prenatal visit  Plan   pregnancy 1 Problems (from 03/14/21 to present)    Problem Noted Resolved   Gestational diabetes mellitus (GDM) affecting first pregnancy 08/25/2021 by Tresea Mall, CNM No   Obesity affecting pregnancy 07/10/2021 by Conard Novak, MD No   Rh negative state in antepartum period 05/02/2021 by Conard Novak, MD No   Supervision of high-risk pregnancy 03/17/2021 by Mirna Mires, CNM No   Overview Addendum 06/06/2021  5:37 PM by Natale Milch, MD     Nursing Staff Provider  Office Location  [facility] Dating    Language  English Anatomy US   incomplete  Flu Vaccine   Genetic Screen  NIPS: normal XY  TDaP vaccine    Hgb A1C or  GTT Early : Third trimester :   Covid vaccinated   LAB RESULTS   Rhogam   Blood Type   O-  Feeding Plan Breast Antibody  neg  Contraception Condoms Rubella  immune  Circumcision  RPR   reactive with 1:1 ratio and neg T Pallidum  Pediatrician   HBsAg   neg  Support Person Friend: Kayla HIV  NR  Prenatal Classes Discussed Varicella Non immune    GBS  (For PCN allergy, check sensitivities)   BTL Consent     VBAC Consent  Pap  NILM 2022    Hgb Electro      CF  SMA                    Preterm labor symptoms and general obstetric precautions including but not limited to vaginal bleeding, contractions, leaking of fluid and fetal movement were reviewed in detail with the patient.  Check blood sugars, keep log, bring log to visits, follow diet/exercise guidelines   Return in about 1 week (around 10/13/2021)  for rob.  Tresea Mall, CNM 10/06/2021 10:54 AM

## 2021-10-08 LAB — STREP GP B NAA: Strep Gp B NAA: NEGATIVE

## 2021-10-09 LAB — CERVICOVAGINAL ANCILLARY ONLY
Chlamydia: NEGATIVE
Comment: NEGATIVE
Comment: NEGATIVE
Comment: NORMAL
Neisseria Gonorrhea: NEGATIVE
Trichomonas: NEGATIVE

## 2021-10-10 ENCOUNTER — Other Ambulatory Visit: Payer: Self-pay

## 2021-10-10 ENCOUNTER — Ambulatory Visit (INDEPENDENT_AMBULATORY_CARE_PROVIDER_SITE_OTHER): Payer: Managed Care, Other (non HMO) | Admitting: Obstetrics

## 2021-10-10 VITALS — BP 126/74 | Wt 242.0 lb

## 2021-10-10 DIAGNOSIS — Z3A37 37 weeks gestation of pregnancy: Secondary | ICD-10-CM

## 2021-10-10 DIAGNOSIS — O0993 Supervision of high risk pregnancy, unspecified, third trimester: Secondary | ICD-10-CM

## 2021-10-10 LAB — POCT URINALYSIS DIPSTICK
Glucose, UA: NEGATIVE
Protein, UA: NEGATIVE

## 2021-10-10 NOTE — Progress Notes (Signed)
No vb. No lof.  

## 2021-10-10 NOTE — Addendum Note (Signed)
Addended by: Brien Few on: 10/10/2021 02:58 PM   Modules accepted: Orders

## 2021-10-10 NOTE — Progress Notes (Signed)
Routine Prenatal Care Visit  Subjective  Daisy Douglas is a 26 y.o. G1P0000 at [redacted]w[redacted]d being seen today for ongoing prenatal care.  She is currently monitored for the following issues for this high-risk pregnancy and has Anxiety; Vitamin D deficiency; Supervision of high-risk pregnancy; Rh negative state in antepartum period; Obesity affecting pregnancy; Pregnancy related bilateral lower abdominal pain, antepartum; Gestational diabetes mellitus (GDM) affecting first pregnancy; and Pregnancy on their problem list.  ----------------------------------------------------------------------------------- Patient reports no complaints. She did not bring her glucose readings. Had a recent growth scan- Baby is at the 42 %. Contractions: Not present. Vag. Bleeding: None.  Movement: Present. Leaking Fluid denies.  ----------------------------------------------------------------------------------- The following portions of the patient's history were reviewed and updated as appropriate: allergies, current medications, past family history, past medical history, past social history, past surgical history and problem list. Problem list updated.  Objective  Blood pressure 126/74, weight 242 lb (109.8 kg), last menstrual period 01/21/2021. Pregravid weight 187 lb (84.8 kg) Total Weight Gain 55 lb (24.9 kg) Urinalysis: Urine Protein    Urine Glucose    Fetal Status:     Movement: Present     General:  Alert, oriented and cooperative. Patient is in no acute distress.  Skin: Skin is warm and dry. No rash noted.   Cardiovascular: Normal heart rate noted  Respiratory: Normal respiratory effort, no problems with respiration noted  Abdomen: Soft, gravid, appropriate for gestational age. Pain/Pressure: Absent     Pelvic:  Cervical exam deferred        Extremities: Normal range of motion.  Edema: None  Mental Status: Normal mood and affect. Normal behavior. Normal judgment and thought content.   Assessment   26 y.o.  G1P0000 at [redacted]w[redacted]d by  10/28/2021, by Last Menstrual Period presenting for routine prenatal visit  Plan   pregnancy 1 Problems (from 03/14/21 to present)    Problem Noted Resolved   Gestational diabetes mellitus (GDM) affecting first pregnancy 08/25/2021 by Rod Can, CNM No   Obesity affecting pregnancy 07/10/2021 by Will Bonnet, MD No   Rh negative state in antepartum period 05/02/2021 by Will Bonnet, MD No   Supervision of high-risk pregnancy 03/17/2021 by Imagene Riches, CNM No   Overview Addendum 10/10/2021  2:16 PM by Imagene Riches, CNM     Nursing Staff Provider  Office Location  [facility] Dating    Language  English Anatomy US   incomplete  Flu Vaccine   Genetic Screen  NIPS: normal XY  TDaP vaccine    Hgb A1C or  GTT Early : Third trimester :   Covid vaccinated   LAB RESULTS   Rhogam   Blood Type   O-  Feeding Plan Breast Antibody  neg  Contraception Condoms Rubella  immune  Circumcision  RPR   reactive with 1:1 ratio and neg T Pallidum  Pediatrician   HBsAg   neg  Support Person Friend: Kayla HIV  NR  Prenatal Classes Discussed Varicella Non immune    GBS  (For PCN allergy, check sensitivities) neg  BTL Consent     VBAC Consent  Pap  NILM 2022    Hgb Electro      CF      SMA                    Term labor symptoms and general obstetric precautions including but not limited to vaginal bleeding, contractions, leaking of fluid and fetal movement were reviewed in detail with the  patient. Please refer to After Visit Summary for other counseling recommendations.  Strongly encouraged to apply her diabetic diet. She has been eating fruit smoothies and her glucose readings have been higher. She promises to bring her glucose log next visit.  Return in about 1 week (around 10/17/2021) for return OB, needs to bring her glucose record! MD visit if possible.  Imagene Riches, CNM  10/10/2021 2:18 PM

## 2021-10-13 ENCOUNTER — Encounter: Payer: Managed Care, Other (non HMO) | Admitting: Advanced Practice Midwife

## 2021-10-17 NOTE — Discharge Summary (Signed)
Physician Discharge Summary   Patient ID: Daisy Douglas [id number] 26 y.o. 1996-07-17  Admit date: 10/02/2021  Discharge date and time: 10/02/2021  3:30 AM   Admitting Physician: Homero Fellers, MD   Discharge Physician: Adrian Prows  Admission Diagnoses: Pregnancy [Z34.90]  Discharge Diagnoses: Abdominal pain in pregnancy  Admission Condition: good  Discharged Condition: good  Indication for Admission: Evaluation of abdominal pain in pregnancy.   Hospital Course: Patient was admitted for monitoring of abdominal pain in pregnancy. Was not found to be in labor. Reassuring fetal status. Was discharged home.   Consults: None  Significant Diagnostic Studies: none  Treatments: none  Discharge Exam: BP 126/65    Pulse 86    Temp 98.4 F (36.9 C) (Oral)    Resp 16    Ht _0  (1.626 m)    Wt 104.3 kg    LMP 01/21/2021 (Exact Date)    BMI 39.48 kg/m   General Appearance:    Alert, cooperative, no distress, appears stated age  Head:    Normocephalic, without obvious abnormality, atraumatic  Eyes:    PERRL, conjunctiva/corneas clear, EOM's intact, fundi    benign, both eyes  Ears:    Normal TM's and external ear canals, both ears  Nose:   Nares normal, septum midline, mucosa normal, no drainage    or sinus tenderness  Throat:   Lips, mucosa, and tongue normal; teeth and gums normal  Neck:   Supple, symmetrical, trachea midline, no adenopathy;    thyroid:  no enlargement/tenderness/nodules; no carotid   bruit or JVD  Back:     Symmetric, no curvature, ROM normal, no CVA tenderness  Lungs:     Clear to auscultation bilaterally, respirations unlabored  Chest Wall:    No tenderness or deformity   Heart:    Regular rate and rhythm, S1 and S2 normal, no murmur, rub   or gallop  Breast Exam:    No tenderness, masses, or nipple abnormality  Abdomen:     Soft, non-tender, bowel sounds active all four quadrants,    no masses, no organomegaly  Genitalia:    Normal female  without lesion, discharge or tenderness  Rectal:    Normal tone, normal prostate, no masses or tenderness;   guaiac negative stool  Extremities:   Extremities normal, atraumatic, no cyanosis or edema  Pulses:   2+ and symmetric all extremities  Skin:   Skin color, texture, turgor normal, no rashes or lesions  Lymph nodes:   Cervical, supraclavicular, and axillary nodes normal  Neurologic:   CNII-XII intact, normal strength, sensation and reflexes    throughout    Disposition: Discharge disposition: 01-Home or Self Care       Patient Instructions:  Allergies as of 10/02/2021   No Known Allergies      Medication List     ASK your doctor about these medications    Accu-Chek Softclix Lancets lancets Check blood sugar 4 times daily; fasting and 2 hours after each meal   OneTouch Verio test strip Generic drug: glucose blood Check blood sugar 4 times daily; fasting and 2 hours after each meal   OneTouch Verio w/Device Kit Check blood sugar 4 times daily; fasting and 2 hours after each meal   prenatal multivitamin Tabs tablet Take 1 tablet by mouth daily at 12 noon.       Activity: activity as tolerated Diet: regular diet Wound Care: none needed  Follow-up with [facility] as planned for routine OB care  Signed: Homero Fellers 10/17/2021 11:10 PM

## 2021-10-19 ENCOUNTER — Inpatient Hospital Stay: Payer: Managed Care, Other (non HMO) | Admitting: Anesthesiology

## 2021-10-19 ENCOUNTER — Inpatient Hospital Stay
Admission: EM | Admit: 2021-10-19 | Discharge: 2021-10-21 | DRG: 806 | Disposition: A | Discharge status: Home / Self Care | Payer: Managed Care, Other (non HMO) | Attending: Obstetrics & Gynecology | Admitting: Obstetrics & Gynecology

## 2021-10-19 ENCOUNTER — Other Ambulatory Visit: Payer: Self-pay

## 2021-10-19 ENCOUNTER — Encounter: Payer: Self-pay | Admitting: Obstetrics

## 2021-10-19 DIAGNOSIS — Z23 Encounter for immunization: Secondary | ICD-10-CM

## 2021-10-19 DIAGNOSIS — O9081 Anemia of the puerperium: Secondary | ICD-10-CM | POA: Diagnosis not present

## 2021-10-19 DIAGNOSIS — Z3A38 38 weeks gestation of pregnancy: Secondary | ICD-10-CM

## 2021-10-19 DIAGNOSIS — O4292 Full-term premature rupture of membranes, unspecified as to length of time between rupture and onset of labor: Secondary | ICD-10-CM | POA: Diagnosis present

## 2021-10-19 DIAGNOSIS — O24410 Gestational diabetes mellitus in pregnancy, diet controlled: Secondary | ICD-10-CM | POA: Diagnosis not present

## 2021-10-19 DIAGNOSIS — D62 Acute posthemorrhagic anemia: Secondary | ICD-10-CM | POA: Diagnosis not present

## 2021-10-19 DIAGNOSIS — Z20822 Contact with and (suspected) exposure to covid-19: Secondary | ICD-10-CM | POA: Diagnosis present

## 2021-10-19 DIAGNOSIS — O26893 Other specified pregnancy related conditions, third trimester: Secondary | ICD-10-CM | POA: Diagnosis present

## 2021-10-19 DIAGNOSIS — O24419 Gestational diabetes mellitus in pregnancy, unspecified control: Secondary | ICD-10-CM | POA: Diagnosis present

## 2021-10-19 DIAGNOSIS — O479 False labor, unspecified: Secondary | ICD-10-CM | POA: Diagnosis present

## 2021-10-19 DIAGNOSIS — O4212 Full-term premature rupture of membranes, onset of labor more than 24 hours following rupture: Secondary | ICD-10-CM | POA: Diagnosis not present

## 2021-10-19 DIAGNOSIS — Z6791 Unspecified blood type, Rh negative: Secondary | ICD-10-CM

## 2021-10-19 DIAGNOSIS — O24420 Gestational diabetes mellitus in childbirth, diet controlled: Secondary | ICD-10-CM | POA: Diagnosis present

## 2021-10-19 DIAGNOSIS — O4290 Premature rupture of membranes, unspecified as to length of time between rupture and onset of labor, unspecified weeks of gestation: Secondary | ICD-10-CM | POA: Diagnosis not present

## 2021-10-19 LAB — COMPREHENSIVE METABOLIC PANEL
ALT: 14 U/L (ref 0–44)
AST: 20 U/L (ref 15–41)
Albumin: 2.7 g/dL — ABNORMAL LOW (ref 3.5–5.0)
Alkaline Phosphatase: 180 U/L — ABNORMAL HIGH (ref 38–126)
Anion gap: 9 (ref 5–15)
BUN: 6 mg/dL (ref 6–20)
CO2: 22 mmol/L (ref 22–32)
Calcium: 9.3 mg/dL (ref 8.9–10.3)
Chloride: 106 mmol/L (ref 98–111)
Creatinine, Ser: 0.58 mg/dL (ref 0.44–1.00)
GFR, Estimated: >60 mL/min (ref >60)
Glucose, Bld: 98 mg/dL (ref 70–99)
Potassium: 3.9 mmol/L (ref 3.5–5.1)
Sodium: 137 mmol/L (ref 135–145)
Total Bilirubin: 0.5 mg/dL (ref 0.3–1.2)
Total Protein: 6.8 g/dL (ref 6.5–8.1)

## 2021-10-19 LAB — GLUCOSE, CAPILLARY
Glucose-Capillary: 95 mg/dL (ref 70–99)
Glucose-Capillary: 97 mg/dL (ref 70–99)
Glucose-Capillary: 83 mg/dL (ref 70–99)
Glucose-Capillary: 99 mg/dL (ref 70–99)

## 2021-10-19 LAB — RESP PANEL BY RT-PCR (FLU A&B, COVID) ARPGX2
Influenza A by PCR: NEGATIVE
Influenza B by PCR: NEGATIVE
SARS Coronavirus 2 by RT PCR: NEGATIVE

## 2021-10-19 LAB — TYPE AND SCREEN
ABO/RH(D): O NEG
Antibody Screen: POSITIVE

## 2021-10-19 LAB — CBC WITH DIFFERENTIAL/PLATELET
Abs Immature Granulocytes: 0 10*3/uL (ref 0.00–0.07)
Basophils Absolute: 0 10*3/uL (ref 0.0–0.1)
Basophils Relative: 0 %
Eosinophils Absolute: 0.2 10*3/uL (ref 0.0–0.5)
Eosinophils Relative: 2 %
HCT: 30.3 % — ABNORMAL LOW (ref 36.0–46.0)
Hemoglobin: 9.6 g/dL — ABNORMAL LOW (ref 12.0–15.0)
Lymphocytes Relative: 25 %
Lymphs Abs: 2.3 10*3/uL (ref 0.7–4.0)
MCH: 25.1 pg — ABNORMAL LOW (ref 26.0–34.0)
MCHC: 31.7 g/dL (ref 30.0–36.0)
MCV: 79.3 fL — ABNORMAL LOW (ref 80.0–100.0)
Monocytes Absolute: 0.8 10*3/uL (ref 0.1–1.0)
Monocytes Relative: 9 %
Neutro Abs: 6 10*3/uL (ref 1.7–7.7)
Neutrophils Relative %: 64 %
Platelets: 210 10*3/uL (ref 150–400)
RBC: 3.82 MIL/uL — ABNORMAL LOW (ref 3.87–5.11)
RDW: 14.6 % (ref 11.5–15.5)
WBC: 9.3 10*3/uL (ref 4.0–10.5)
nRBC: 0 % (ref 0.0–0.2)

## 2021-10-19 LAB — RPR: RPR Ser Ql: NONREACTIVE

## 2021-10-19 LAB — PROTEIN / CREATININE RATIO, URINE
Creatinine, Urine: 62 mg/dL
Protein Creatinine Ratio: 0.26 mg/mg{Cre} — ABNORMAL HIGH (ref 0.00–0.15)
Total Protein, Urine: 16 mg/dL

## 2021-10-19 MED ORDER — LIDOCAINE HCL (PF) 1 % IJ SOLN
30.0000 mL | INTRAMUSCULAR | Status: DC | PRN
  Filled 2021-10-19: qty 30

## 2021-10-19 MED ORDER — BUPIVACAINE HCL (PF) 0.25 % IJ SOLN
INTRAMUSCULAR | Status: DC | PRN
  Administered 2021-10-19 (×2): 4 mL via EPIDURAL

## 2021-10-19 MED ORDER — BUTORPHANOL TARTRATE 1 MG/ML IJ SOLN: 2.0000 mg | INTRAMUSCULAR | Status: DC | PRN

## 2021-10-19 MED ORDER — OXYTOCIN-SODIUM CHLORIDE 30-0.9 UT/500ML-% IV SOLN
2.5000 [IU]/h | INTRAVENOUS | Status: DC
  Administered 2021-10-20: 2.5 [IU]/h via INTRAVENOUS
  Filled 2021-10-19: qty 500

## 2021-10-19 MED ORDER — MISOPROSTOL 200 MCG PO TABS
ORAL_TABLET | ORAL | Status: AC
  Administered 2021-10-20: 800 ug
  Filled 2021-10-19: qty 4

## 2021-10-19 MED ORDER — AMMONIA AROMATIC IN INHA
RESPIRATORY_TRACT | Status: AC
  Filled 2021-10-19: qty 10

## 2021-10-19 MED ORDER — OXYTOCIN BOLUS FROM INFUSION
333.0000 mL | Freq: Once | INTRAVENOUS | Status: AC
  Administered 2021-10-20: 333 mL via INTRAVENOUS

## 2021-10-19 MED ORDER — ACETAMINOPHEN 325 MG PO TABS
650.0000 mg | ORAL_TABLET | ORAL | Status: DC | PRN
  Administered 2021-10-20 – 2021-10-21 (×3): 650 mg via ORAL
  Filled 2021-10-19 (×3): qty 2

## 2021-10-19 MED ORDER — ONDANSETRON HCL 4 MG/2ML IJ SOLN: 4.0000 mg | Freq: Four times a day (QID) | INTRAMUSCULAR | Status: DC | PRN

## 2021-10-19 MED ORDER — OXYTOCIN-SODIUM CHLORIDE 30-0.9 UT/500ML-% IV SOLN
1.0000 m[IU]/min | INTRAVENOUS | Status: DC
  Administered 2021-10-19: 2 m[IU]/min via INTRAVENOUS
  Filled 2021-10-19: qty 500

## 2021-10-19 MED ORDER — FENTANYL-BUPIVACAINE-NACL 0.5-0.125-0.9 MG/250ML-% EP SOLN
EPIDURAL | Status: AC
  Filled 2021-10-19: qty 250

## 2021-10-19 MED ORDER — LACTATED RINGERS IV SOLN: 500.0000 mL | INTRAVENOUS | Status: DC | PRN

## 2021-10-19 MED ORDER — PHENYLEPHRINE 40 MCG/ML (10ML) SYRINGE FOR IV PUSH (FOR BLOOD PRESSURE SUPPORT)
80.0000 ug | PREFILLED_SYRINGE | INTRAVENOUS | Status: DC | PRN
Start: 2021-10-19 — End: 2021-10-20
  Filled 2021-10-19: qty 10

## 2021-10-19 MED ORDER — OXYCODONE-ACETAMINOPHEN 5-325 MG PO TABS: 1.0000 | ORAL_TABLET | ORAL | Status: DC | PRN

## 2021-10-19 MED ORDER — LACTATED RINGERS IV SOLN
500.0000 mL | Freq: Once | INTRAVENOUS | Status: AC
  Administered 2021-10-19: 500 mL via INTRAVENOUS

## 2021-10-19 MED ORDER — PHENYLEPHRINE 40 MCG/ML (10ML) SYRINGE FOR IV PUSH (FOR BLOOD PRESSURE SUPPORT)
80.0000 ug | PREFILLED_SYRINGE | INTRAVENOUS | Status: DC | PRN
  Administered 2021-10-20: 80 ug via INTRAVENOUS

## 2021-10-19 MED ORDER — LIDOCAINE-EPINEPHRINE (PF) 1.5 %-1:200000 IJ SOLN
INTRAMUSCULAR | Status: DC | PRN
  Administered 2021-10-19: 3 mL via PERINEURAL

## 2021-10-19 MED ORDER — FENTANYL-BUPIVACAINE-NACL 0.5-0.125-0.9 MG/250ML-% EP SOLN
12.0000 mL/h | EPIDURAL | Status: DC | PRN
  Administered 2021-10-19: 12 mL/h via EPIDURAL

## 2021-10-19 MED ORDER — LIDOCAINE HCL (PF) 1 % IJ SOLN
INTRAMUSCULAR | Status: AC
  Filled 2021-10-19: qty 30

## 2021-10-19 MED ORDER — EPHEDRINE 5 MG/ML INJ
10.0000 mg | INTRAVENOUS | Status: DC | PRN
  Administered 2021-10-20: 10 mg via INTRAVENOUS

## 2021-10-19 MED ORDER — OXYTOCIN-SODIUM CHLORIDE 30-0.9 UT/500ML-% IV SOLN: 1.0000 m[IU]/min | INTRAVENOUS | Status: DC

## 2021-10-19 MED ORDER — SOD CITRATE-CITRIC ACID 500-334 MG/5ML PO SOLN: 30.0000 mL | ORAL | Status: DC | PRN

## 2021-10-19 MED ORDER — LACTATED RINGERS IV SOLN: INTRAVENOUS | Status: DC

## 2021-10-19 MED ORDER — TERBUTALINE SULFATE 1 MG/ML IJ SOLN: 0.2500 mg | Freq: Once | INTRAMUSCULAR | Status: DC | PRN

## 2021-10-19 MED ORDER — BUTORPHANOL TARTRATE 1 MG/ML IJ SOLN
INTRAMUSCULAR | Status: AC
  Administered 2021-10-19: 2 mg via INTRAVENOUS
  Filled 2021-10-19: qty 2

## 2021-10-19 MED ORDER — EPHEDRINE 5 MG/ML INJ: 10.0000 mg | INTRAVENOUS | Status: DC | PRN

## 2021-10-19 MED ORDER — DIPHENHYDRAMINE HCL 50 MG/ML IJ SOLN: 12.5000 mg | INTRAMUSCULAR | Status: DC | PRN

## 2021-10-19 MED ORDER — EPHEDRINE 5 MG/ML INJ
10.0000 mg | INTRAVENOUS | Status: AC | PRN
  Administered 2021-10-19 (×2): 10 mg via INTRAVENOUS

## 2021-10-19 MED ORDER — OXYTOCIN 10 UNIT/ML IJ SOLN
INTRAMUSCULAR | Status: AC
  Filled 2021-10-19: qty 2

## 2021-10-19 MED ORDER — OXYCODONE-ACETAMINOPHEN 5-325 MG PO TABS: 2.0000 | ORAL_TABLET | ORAL | Status: DC | PRN

## 2021-10-19 NOTE — Progress Notes (Signed)
? ?Subjective:  ?Starting to feel more uncomfortable and sleepy. Desires pain medication.  ? ?Objective:  ? ?Vitals: Blood pressure (!) 145/72, pulse 79, temperature 98.5 ?F (36.9 ?C), temperature source Oral, resp. rate 18, height 5\' 4"  (1.626 m), weight 110 kg, last menstrual period 01/21/2021. ?General: NAD ?Abdomen: ?Cervical Exam:  ?Dilation: 2 ?Effacement (%): 60 ?Cervical Position: Middle ?Station: -2 ?Presentation: Vertex ?Exam by:: [email or web address] RN ? ?FHT: baseline 135, moderate variability, pos accel , neg decel  ?Toco:q2-3, moderate, soft resting tone ?Pitocin at 12 milli-units/min ? ?Results for orders placed or performed during the hospital encounter of 10/19/21 (from the past 24 hour(s))  ?Resp Panel by RT-PCR (Flu A&B, Covid) Nasopharyngeal Swab     Status: None  ? Collection Time: 10/19/21  3:04 AM  ? Specimen: Nasopharyngeal Swab; Nasopharyngeal(NP) swabs in vial transport medium  ?Result Value Ref Range  ? SARS Coronavirus 2 by RT PCR NEGATIVE NEGATIVE  ? Influenza A by PCR NEGATIVE NEGATIVE  ? Influenza B by PCR NEGATIVE NEGATIVE  ?CBC with Differential/Platelet     Status: Abnormal  ? Collection Time: 10/19/21  3:05 AM  ?Result Value Ref Range  ? WBC 9.3 4.0 - 10.5 K/uL  ? RBC 3.82 (L) 3.87 - 5.11 MIL/uL  ? Hemoglobin 9.6 (L) 12.0 - 15.0 g/dL  ? HCT 30.3 (L) 36.0 - 46.0 %  ? MCV 79.3 (L) 80.0 - 100.0 fL  ? MCH 25.1 (L) 26.0 - 34.0 pg  ? MCHC 31.7 30.0 - 36.0 g/dL  ? RDW 14.6 11.5 - 15.5 %  ? Platelets 210 150 - 400 K/uL  ? nRBC 0.0 0.0 - 0.2 %  ? Neutrophils Relative % 64 %  ? Neutro Abs 6.0 1.7 - 7.7 K/uL  ? Lymphocytes Relative 25 %  ? Lymphs Abs 2.3 0.7 - 4.0 K/uL  ? Monocytes Relative 9 %  ? Monocytes Absolute 0.8 0.1 - 1.0 K/uL  ? Eosinophils Relative 2 %  ? Eosinophils Absolute 0.2 0.0 - 0.5 K/uL  ? Basophils Relative 0 %  ? Basophils Absolute 0.0 0.0 - 0.1 K/uL  ? WBC Morphology MORPHOLOGY UNREMARKABLE   ? Abs Immature Granulocytes 0.00 0.00 - 0.07 K/uL  ?Type and screen [facility]     Status: None  ? Collection Time: 10/19/21  3:05 AM  ?Result Value Ref Range  ? ABO/RH(D) O NEG   ? Antibody Screen POS   ? Sample Expiration 10/22/2021,2359   ? Antibody Identification    ?  PASSIVELY ACQUIRED ANTI-D ?Performed at [facility], [address], [place], [place] ?  ?Protein / creatinine ratio, urine     Status: Abnormal  ? Collection Time: 10/19/21  3:05 AM  ?Result Value Ref Range  ? Creatinine, Urine 62 mg/dL  ? Total Protein, Urine 16 mg/dL  ? Protein Creatinine Ratio 0.26 (H) 0.00 - 0.15 mg/mg[Cre]  ?Comprehensive metabolic panel     Status: Abnormal  ? Collection Time: 10/19/21  5:50 AM  ?Result Value Ref Range  ? Sodium 137 135 - 145 mmol/L  ? Potassium 3.9 3.5 - 5.1 mmol/L  ? Chloride 106 98 - 111 mmol/L  ? CO2 22 22 - 32 mmol/L  ? Glucose, Bld 98 70 - 99 mg/dL  ? BUN 6 6 - 20 mg/dL  ? Creatinine, Ser 0.58 0.44 - 1.00 mg/dL  ? Calcium 9.3 8.9 - 10.3 mg/dL  ? Total Protein 6.8 6.5 - 8.1 g/dL  ? Albumin 2.7 (L)  3.5 - 5.0 g/dL  ? AST 20 15 - 41 U/L  ? ALT 14 0 - 44 U/L  ? Alkaline Phosphatase 180 (H) 38 - 126 U/L  ? Total Bilirubin 0.5 0.3 - 1.2 mg/dL  ? GFR, Estimated >60 >60 mL/min  ? Anion gap 9 5 - 15  ?Glucose, capillary     Status: None  ? Collection Time: 10/19/21  5:50 AM  ?Result Value Ref Range  ? Glucose-Capillary 95 70 - 99 mg/dL  ?Glucose, capillary     Status: None  ? Collection Time: 10/19/21 11:50 AM  ?Result Value Ref Range  ? Glucose-Capillary 97 70 - 99 mg/dL  ? ? ?Assessment:  ? 26 y.o. G1P0000 [redacted]w[redacted]d admitted for SROM in early labor ? ?Plan:  ? ?1) Labor -early labor, Pitocin infusing  ? ?2) Fetus - Category 1 tracing  ? ?3) SROM at 0115, GBS negative, monitor for sings of infection  ? ?4) GHTN, labs WNL ? ?5) GDM last blood sugar 97 ? ?6) Pain management, RN to give Stadol, desires epidural in the future ? ?Carie Caddy, CNM  ?[facility], [facility]  ?@TODAY @  ?12:18 PM  ? ? ?  ?

## 2021-10-19 NOTE — Progress Notes (Signed)
Pt seen, recent admission for SROM, pt has gest DM ?Pt comfortable ?She is having ctxs, but they are mild to min for her ?BS 95 ? ?A NST procedure was performed with FHR monitoring and a normal baseline established, appropriate time of 20-40 minutes of evaluation, and accels >2 seen w 15x15 characteristics.  Results show a REACTIVE NST.  ? ?Plan- active management of labor as SROM 6 hrs ago, she has some ctxs but they are mild ?Pitocin ?Monitor BS ?FWR ? ?Annamarie Major, MD, FACOG ?[facility], [facility] ?10/19/2021  7:21 AM ? ?

## 2021-10-19 NOTE — Anesthesia Preprocedure Evaluation (Signed)
Anesthesia Evaluation  ?Patient identified by MRN, date of birth, ID band ?Patient awake ? ? ? ?Reviewed: ?Allergy & Precautions, H&P , NPO status , Patient's Chart, lab work & pertinent test results ? ?History of Anesthesia Complications ?Negative for: history of anesthetic complications ? ?Airway ?Mallampati: II ? ? ? ? ? ? Dental ?no notable dental hx. ? ?  ?Pulmonary ?neg pulmonary ROS,  ?  ?Pulmonary exam normal ? ? ? ? ? ? ? Cardiovascular ?negative cardio ROS ?Normal cardiovascular exam ? ? ?  ?Neuro/Psych ?Anxiety negative neurological ROS ?   ? GI/Hepatic ?negative GI ROS, Neg liver ROS,   ?Endo/Other  ?negative endocrine ROSdiabetes ? Renal/GU ?  ?negative genitourinary ?  ?Musculoskeletal ? ? Abdominal ?  ?Peds ? Hematology ?negative hematology ROS ?(+)   ?Anesthesia Other Findings ?Past Medical History: ?No date: Dysmenorrhea ?No date: Gestational diabetes ?No date: Pre-diabetes ?01/2021: Type O blood, Rh negative ?No date: Vitamin D deficiency ? ? ? Reproductive/Obstetrics ?(+) Pregnancy ? ?  ? ? ? ? ? ? ? ? ? ? ? ? ? ?  ?  ? ? ? ? ? ? ? ? ?Anesthesia Physical ?Anesthesia Plan ? ?ASA: 2 ? ?Anesthesia Plan: Epidural  ? ?Post-op Pain Management:   ? ?Induction:  ? ?PONV Risk Score and Plan:  ? ?Airway Management Planned:  ? ?Additional Equipment:  ? ?Intra-op Plan:  ? ?Post-operative Plan:  ? ?Informed Consent: I have reviewed the patients History and Physical, chart, labs and discussed the procedure including the risks, benefits and alternatives for the proposed anesthesia with the patient or authorized representative who has indicated his/her understanding and acceptance.  ? ? ? ? ? ?Plan Discussed with: Anesthesiologist ? ?Anesthesia Plan Comments:   ? ? ? ? ? ? ?Anesthesia Quick Evaluation ? ?

## 2021-10-19 NOTE — H&P (Signed)
Daisy Douglas is a 26 y.o. female presenting  with spontaneous rupture of membranes earlier this morning at 0115.she reports that she has felt mild contractions since the SROM. Her baby is moving well.  ?OB History   ? ? Gravida  ?1  ? Para  ?0  ? Term  ?0  ? Preterm  ?0  ? AB  ?0  ? Living  ?0  ?  ? ? SAB  ?0  ? IAB  ?0  ? Ectopic  ?0  ? Multiple  ?0  ? Live Births  ?0  ?   ?  ?  ? ?Past Medical History:  ?Diagnosis Date  ? Dysmenorrhea   ? Gestational diabetes   ? Pre-diabetes   ? Type O blood, Rh negative 01/2021  ? Vitamin D deficiency   ? ?Past Surgical History:  ?Procedure Laterality Date  ? NO PAST SURGERIES    ? ?Family History: family history includes Diabetes in her father, mother, and sister; Hypertension in her father and mother; Thyroid disease in her sister. ?Social History:  reports that she has never smoked. She has never used smokeless tobacco. She reports that she does not drink alcohol and does not use drugs. ? ? ?  ?Maternal Diabetes: Yes:  Diabetes Type:  Diet controlled ?Genetic Screening: Normal ?Maternal Ultrasounds/Referrals: Normal ?Fetal Ultrasounds or other Referrals:  Referred to Materal Fetal Medicine  for gestational DM and high BMI ?Maternal Substance Abuse:  No ?Significant Maternal Medications:  None ?Significant Maternal Lab Results:  Group B Strep negative and Rh negative ?Other Comments:  None ? ?Review of Systems ?History ?Dilation: 1.5 ?Effacement (%): 70 ?Station: -3 ?Exam by:: Paula Compton CNM ?Blood pressure 126/85, pulse 72, temperature 99.3 ?F (37.4 ?C), temperature source Oral, resp. rate (P) 16, height 5\' 4"  (1.626 m), weight 110 kg, last menstrual period 01/21/2021. ?Maternal Exam:  ?Introitus: Normal vulva.  ?Physical Exam ?Constitutional:   ?   Appearance: Normal appearance. She is obese.  ?HENT:  ?   Head: Normocephalic and atraumatic.  ?Cardiovascular:  ?   Rate and Rhythm: Normal rate and regular rhythm.  ?   Pulses: Normal pulses.  ?   Heart sounds: Normal  heart sounds.  ?Pulmonary:  ?   Effort: Pulmonary effort is normal.  ?   Breath sounds: Normal breath sounds.  ?Abdominal:  ?   Comments: Gravid, adipose  ?Genitourinary: ?   General: Normal vulva.  ?   Rectum: Normal.  ?   Comments: SVE: 1.5cms/70%/-3 ?Musculoskeletal:     ?   General: Normal range of motion.  ?   Cervical back: Normal range of motion and neck supple.  ?Skin: ?   General: Skin is warm and dry.  ?Neurological:  ?   General: No focal deficit present.  ?   Mental Status: She is alert and oriented to person, place, and time.  ?Psychiatric:     ?   Mood and Affect: Mood normal.     ?   Behavior: Behavior normal.  ?  ?Prenatal labs: ?ABO, Rh: --/--/O NEG (03/02 0305) ?Antibody: POS (03/02 0305) ?Rubella: 2.80 (08/16 1630) ?RPR: Non Reactive (12/19 1030)  ?HBsAg: Negative (08/16 1630)  ?HIV: Non Reactive (12/19 1030)  ?GBS: Negative/-- (02/17 1040)  ? ?Assessment/Plan: ?IUP 38 weeks 5 days ?SROM at 0115- clear fluid ?Category 1 FHT tracing ?Gestational diabetes- diet controlled ?GBS negative, RH negative ?Plan: Admitted ?Continuous EFM ?Glucose checks q 4 hours ?Will commence pitocin augmentation at 6 hours post  rupture. ? ?Mirna Mires, CNM  ?10/19/2021 6:49 AM  ?  ? ?Mirna Mires ?10/19/2021, 6:37 AM ? ? ? ? ?

## 2021-10-19 NOTE — Anesthesia Procedure Notes (Signed)
Epidural ?Patient location during procedure: OB ?Start time: 10/19/2021 4:45 PM ?End time: 10/19/2021 4:48 PM ? ?Staffing ?Anesthesiologist: Reed Breech, MD ?Resident/CRNA: Stormy Fabian, CRNA ?Performed: resident/CRNA  ? ?Preanesthetic Checklist ?Completed: patient identified, IV checked, site marked, risks and benefits discussed, surgical consent, monitors and equipment checked, pre-op evaluation and timeout performed ? ?Epidural ?Patient position: sitting ?Prep: ChloraPrep ?Patient monitoring: heart rate, continuous pulse ox and blood pressure ?Approach: midline ?Location: L3-L4 ?Injection technique: LOR saline ? ?Needle:  ?Needle type: Tuohy  ?Needle gauge: 17 G ?Needle length: 9 cm and 9 ?Needle insertion depth: 8 and 8.5 cm ?Catheter type: closed end flexible ?Catheter size: 19 Gauge ?Catheter at skin depth: 14 cm ?Test dose: negative and 1.5% lidocaine with Epi 1:200 K ? ?Assessment ?Sensory level: T10 ?Events: blood not aspirated, injection not painful, no injection resistance, no paresthesia and negative IV test ? ?Additional Notes ?1 attempt ?Pt. Evaluated and documentation done after procedure finished. ?Patient identified. Risks/Benefits/Options discussed with patient including but not limited to bleeding, infection, nerve damage, paralysis, failed block, incomplete pain control, headache, blood pressure changes, nausea, vomiting, reactions to medication both or allergic, itching and postpartum back pain. Confirmed with bedside nurse the patient's most recent platelet count. Confirmed with patient that they are not currently taking any anticoagulation, have any bleeding history or any family history of bleeding disorders. Patient expressed understanding and wished to proceed. All questions were answered. Sterile technique was used throughout the entire procedure. Please see nursing notes for vital signs. Test dose was given through epidural catheter and negative prior to continuing to dose epidural or  start infusion. Warning signs of high block given to the patient including shortness of breath, tingling/numbness in hands, complete motor block, or any concerning symptoms with instructions to call for help. Patient was given instructions on fall risk and not to get out of bed. All questions and concerns addressed with instructions to call with any issues or inadequate analgesia.   ? ?Patient tolerated the insertion well without immediate complications.Reason for block:procedure for pain ? ? ? ?

## 2021-10-20 ENCOUNTER — Encounter: Payer: Managed Care, Other (non HMO) | Admitting: Obstetrics

## 2021-10-20 ENCOUNTER — Encounter: Payer: Self-pay | Admitting: Obstetrics

## 2021-10-20 DIAGNOSIS — O4290 Premature rupture of membranes, unspecified as to length of time between rupture and onset of labor, unspecified weeks of gestation: Secondary | ICD-10-CM | POA: Diagnosis not present

## 2021-10-20 LAB — CBC
HCT: 25.4 % — ABNORMAL LOW (ref 36.0–46.0)
Hemoglobin: 7.9 g/dL — ABNORMAL LOW (ref 12.0–15.0)
MCH: 24.5 pg — ABNORMAL LOW (ref 26.0–34.0)
MCHC: 31.1 g/dL (ref 30.0–36.0)
MCV: 78.6 fL — ABNORMAL LOW (ref 80.0–100.0)
Platelets: 179 10*3/uL (ref 150–400)
RBC: 3.23 MIL/uL — ABNORMAL LOW (ref 3.87–5.11)
RDW: 14.7 % (ref 11.5–15.5)
WBC: 16.4 10*3/uL — ABNORMAL HIGH (ref 4.0–10.5)
nRBC: 0 % (ref 0.0–0.2)

## 2021-10-20 MED ORDER — WITCH HAZEL-GLYCERIN EX PADS: 1.0000 "application " | MEDICATED_PAD | CUTANEOUS | Status: DC | PRN

## 2021-10-20 MED ORDER — OXYCODONE-ACETAMINOPHEN 5-325 MG PO TABS: 1.0000 | ORAL_TABLET | ORAL | Status: DC | PRN

## 2021-10-20 MED ORDER — ZOLPIDEM TARTRATE 5 MG PO TABS: 5.0000 mg | ORAL_TABLET | Freq: Every evening | ORAL | Status: DC | PRN

## 2021-10-20 MED ORDER — DIPHENHYDRAMINE HCL 25 MG PO CAPS: 25.0000 mg | ORAL_CAPSULE | Freq: Four times a day (QID) | ORAL | Status: DC | PRN

## 2021-10-20 MED ORDER — ACETAMINOPHEN 325 MG PO TABS: 650.0000 mg | ORAL_TABLET | ORAL | Status: DC | PRN

## 2021-10-20 MED ORDER — ONDANSETRON HCL 4 MG PO TABS: 4.0000 mg | ORAL_TABLET | ORAL | Status: DC | PRN

## 2021-10-20 MED ORDER — OXYCODONE-ACETAMINOPHEN 5-325 MG PO TABS: 2.0000 | ORAL_TABLET | ORAL | Status: DC | PRN

## 2021-10-20 MED ORDER — SODIUM CHLORIDE 0.9% FLUSH
3.0000 mL | Freq: Two times a day (BID) | INTRAVENOUS | Status: DC
  Administered 2021-10-20 – 2021-10-21 (×2): 3 mL via INTRAVENOUS

## 2021-10-20 MED ORDER — DIBUCAINE (PERIANAL) 1 % EX OINT: 1.0000 "application " | TOPICAL_OINTMENT | CUTANEOUS | Status: DC | PRN

## 2021-10-20 MED ORDER — SIMETHICONE 80 MG PO CHEW: 80.0000 mg | CHEWABLE_TABLET | ORAL | Status: DC | PRN

## 2021-10-20 MED ORDER — WITCH HAZEL-GLYCERIN EX PADS
MEDICATED_PAD | CUTANEOUS | Status: AC
  Filled 2021-10-20: qty 100

## 2021-10-20 MED ORDER — IBUPROFEN 600 MG PO TABS
ORAL_TABLET | ORAL | Status: AC
  Administered 2021-10-20: 600 mg via ORAL
  Filled 2021-10-20: qty 1

## 2021-10-20 MED ORDER — MISOPROSTOL 200 MCG PO TABS
800.0000 ug | ORAL_TABLET | ORAL | Status: DC | PRN
  Filled 2021-10-20: qty 4

## 2021-10-20 MED ORDER — BENZOCAINE-MENTHOL 20-0.5 % EX AERO: 1.0000 "application " | INHALATION_SPRAY | CUTANEOUS | Status: DC | PRN

## 2021-10-20 MED ORDER — DIBUCAINE (PERIANAL) 1 % EX OINT
TOPICAL_OINTMENT | CUTANEOUS | Status: AC
  Filled 2021-10-20: qty 28

## 2021-10-20 MED ORDER — IBUPROFEN 600 MG PO TABS
600.0000 mg | ORAL_TABLET | Freq: Four times a day (QID) | ORAL | Status: DC
  Administered 2021-10-20 – 2021-10-21 (×5): 600 mg via ORAL
  Filled 2021-10-20 (×6): qty 1

## 2021-10-20 MED ORDER — COCONUT OIL OIL
1.0000 | TOPICAL_OIL | Status: DC | PRN
Start: 2021-10-20 — End: 2021-10-21
  Filled 2021-10-20: qty 120

## 2021-10-20 MED ORDER — SODIUM CHLORIDE 0.9 % IV SOLN: 250.0000 mL | INTRAVENOUS | Status: DC | PRN

## 2021-10-20 MED ORDER — SENNOSIDES-DOCUSATE SODIUM 8.6-50 MG PO TABS
2.0000 | ORAL_TABLET | ORAL | Status: DC
  Administered 2021-10-20 – 2021-10-21 (×2): 2 via ORAL
  Filled 2021-10-20 (×2): qty 2

## 2021-10-20 MED ORDER — SODIUM CHLORIDE 0.9% FLUSH: 3.0000 mL | INTRAVENOUS | Status: DC | PRN

## 2021-10-20 MED ORDER — ONDANSETRON HCL 4 MG/2ML IJ SOLN: 4.0000 mg | INTRAMUSCULAR | Status: DC | PRN

## 2021-10-20 NOTE — Progress Notes (Signed)
Subjective:  Doing well postpartum 4 hours day 0, she is tired. She has not yet had a meal and denies nausea. She has not yet been out of bed to ambulate or void since delivery. Some assistance given with breastfeeding and swaddling newborn. RN to room to prepare family for move to Mother/Baby.  Objective:  Vital signs in last 24 hours: Temp:  [97.8 F (36.6 C)-99 F (37.2 C)] 98.1 F (36.7 C) (03/03 0706) Pulse Rate:  [76-117] 105 (03/03 0656) Resp:  [16-17] 17 (03/03 0706) BP: (98-153)/(50-106) 122/85 (03/03 0656) SpO2:  [95 %-99 %] 99 % (03/03 0405)    General: NAD Pulmonary: no increased work of breathing Abdomen: non-distended, non-tender, fundus firm at level of umbilicus Extremities: no edema, no erythema, no tenderness  Results for orders placed or performed during the hospital encounter of 10/19/21 (from the past 72 hour(s))  Resp Panel by RT-PCR (Flu A&B, Covid) Nasopharyngeal Swab     Status: None   Collection Time: 10/19/21  3:04 AM   Specimen: Nasopharyngeal Swab; Nasopharyngeal(NP) swabs in vial transport medium  Result Value Ref Range   SARS Coronavirus 2 by RT PCR NEGATIVE NEGATIVE    Comment: (NOTE) SARS-CoV-2 target nucleic acids are NOT DETECTED.  The SARS-CoV-2 RNA is generally detectable in upper respiratory specimens during the acute phase of infection. The lowest concentration of SARS-CoV-2 viral copies this assay can detect is 138 copies/mL. A negative result does not preclude SARS-Cov-2 infection and should not be used as the sole basis for treatment or other patient management decisions. A negative result may occur with  improper specimen collection/handling, submission of specimen other than nasopharyngeal swab, presence of viral mutation(s) within the areas targeted by this assay, and inadequate number of viral copies(<138 copies/mL). A negative result must be combined with clinical observations, patient history, and epidemiological information.  The expected result is Negative.  Fact Sheet for Patients:  BloggerCourse.com  Fact Sheet for Healthcare Providers:  SeriousBroker.it  This test is no t yet approved or cleared by the [place] FDA and  has been authorized for detection and/or diagnosis of SARS-CoV-2 by FDA under an Emergency Use Authorization (EUA). This EUA will remain  in effect (meaning this test can be used) for the duration of the COVID-19 declaration under Section 564(b)(1) of the Act, 21 U.S.C.section 360bbb-3(b)(1), unless the authorization is terminated  or revoked sooner.       Influenza A by PCR NEGATIVE NEGATIVE   Influenza B by PCR NEGATIVE NEGATIVE    Comment: (NOTE) The Xpert Xpress SARS-CoV-2/FLU/RSV plus assay is intended as an aid in the diagnosis of influenza from Nasopharyngeal swab specimens and should not be used as a sole basis for treatment. Nasal washings and aspirates are unacceptable for Xpert Xpress SARS-CoV-2/FLU/RSV testing.  Fact Sheet for Patients: BloggerCourse.com  Fact Sheet for Healthcare Providers: SeriousBroker.it  This test is not yet approved or cleared by the [place] FDA and has been authorized for detection and/or diagnosis of SARS-CoV-2 by FDA under an Emergency Use Authorization (EUA). This EUA will remain in effect (meaning this test can be used) for the duration of the COVID-19 declaration under Section 564(b)(1) of the Act, 21 U.S.C. section 360bbb-3(b)(1), unless the authorization is terminated or revoked.  Performed at [facility], [address] [place], [place] [address]   CBC with Differential/Platelet     Status: Abnormal   Collection Time: 10/19/21  3:05 AM  Result Value Ref Range   WBC 9.3 4.0 -  10.5 K/uL   RBC 3.82 (L) 3.87 - 5.11 MIL/uL   Hemoglobin 9.6 (L) 12.0 - 15.0 g/dL   HCT 54.6 (L) 50.3 - 54.6 %   MCV 79.3 (L) 80.0 -  100.0 fL   MCH 25.1 (L) 26.0 - 34.0 pg   MCHC 31.7 30.0 - 36.0 g/dL   RDW 56.8 12.7 - 51.7 %   Platelets 210 150 - 400 K/uL    Comment: REPEATED TO VERIFY   nRBC 0.0 0.0 - 0.2 %   Neutrophils Relative % 64 %   Neutro Abs 6.0 1.7 - 7.7 K/uL   Lymphocytes Relative 25 %   Lymphs Abs 2.3 0.7 - 4.0 K/uL   Monocytes Relative 9 %   Monocytes Absolute 0.8 0.1 - 1.0 K/uL   Eosinophils Relative 2 %   Eosinophils Absolute 0.2 0.0 - 0.5 K/uL   Basophils Relative 0 %   Basophils Absolute 0.0 0.0 - 0.1 K/uL   WBC Morphology MORPHOLOGY UNREMARKABLE    Abs Immature Granulocytes 0.00 0.00 - 0.07 K/uL    Comment: Performed at [facility], [address] [place], [place] [address]  RPR     Status: None   Collection Time: 10/19/21  3:05 AM  Result Value Ref Range   RPR Ser Ql NON REACTIVE NON REACTIVE    Comment: Performed at [facility], [address] [place], [place] [address]  Type and screen [facility]     Status: None   Collection Time: 10/19/21  3:05 AM  Result Value Ref Range   ABO/RH(D) O NEG    Antibody Screen POS    Sample Expiration 10/22/2021,2359    Antibody Identification      PASSIVELY ACQUIRED ANTI-D Performed at [facility], [address] [place], [place] [address]   Protein / creatinine ratio, urine     Status: Abnormal   Collection Time: 10/19/21  3:05 AM  Result Value Ref Range   Creatinine, Urine 62 mg/dL   Total Protein, Urine 16 mg/dL    Comment: NO NORMAL RANGE ESTABLISHED FOR THIS TEST   Protein Creatinine Ratio 0.26 (H) 0.00 - 0.15 mg/mg[Cre]    Comment: Performed at [facility], [address] [place], [place] [address]  Comprehensive metabolic panel     Status: Abnormal   Collection Time: 10/19/21  5:50 AM  Result Value Ref Range   Sodium 137 135 - 145 mmol/L   Potassium 3.9 3.5 - 5.1 mmol/L   Chloride 106 98 - 111 mmol/L   CO2 22 22 - 32 mmol/L   Glucose, Bld 98 70 - 99 mg/dL    Comment:  Glucose reference range applies only to samples taken after fasting for at least 8 hours.   BUN 6 6 - 20 mg/dL   Creatinine, Ser 5.99 0.44 - 1.00 mg/dL   Calcium 9.3 8.9 - 35.7 mg/dL   Total Protein 6.8 6.5 - 8.1 g/dL   Albumin 2.7 (L) 3.5 - 5.0 g/dL   AST 20 15 - 41 U/L   ALT 14 0 - 44 U/L   Alkaline Phosphatase 180 (H) 38 - 126 U/L   Total Bilirubin 0.5 0.3 - 1.2 mg/dL   GFR, Estimated >01 >77 mL/min    Comment: (NOTE) Calculated using the CKD-EPI Creatinine Equation (2021)    Anion gap 9 5 - 15    Comment: Performed at [facility], [address], [place], [place] [address]  Glucose, capillary  Status: None   Collection Time: 10/19/21  5:50 AM  Result Value Ref Range   Glucose-Capillary 95 70 - 99 mg/dL    Comment: Glucose reference range applies only to samples taken after fasting for at least 8 hours.  Glucose, capillary     Status: None   Collection Time: 10/19/21 11:50 AM  Result Value Ref Range   Glucose-Capillary 97 70 - 99 mg/dL    Comment: Glucose reference range applies only to samples taken after fasting for at least 8 hours.  Glucose, capillary     Status: None   Collection Time: 10/19/21  3:21 PM  Result Value Ref Range   Glucose-Capillary 83 70 - 99 mg/dL    Comment: Glucose reference range applies only to samples taken after fasting for at least 8 hours.  Glucose, capillary     Status: None   Collection Time: 10/19/21  8:39 PM  Result Value Ref Range   Glucose-Capillary 99 70 - 99 mg/dL    Comment: Glucose reference range applies only to samples taken after fasting for at least 8 hours.    Assessment:   26 y.o. G1P1001 postpartum day # 0, lactating  Plan:    1) Acute blood loss anemia - hemodynamically stable and asymptomatic - po ferrous sulfate  2) Blood Type --/--/O NEG (03/02 0305) / Rubella 2.80 (08/16 1630) / Varicella Non-Immune  3) TDAP status  last given 2015  4) Feeding plan breast  5)  Education given regarding options for  contraception, as well as compatibility with breast feeding if applicable.  Patient plans on condoms for contraception.  6) Disposition: continue current care   Tresea Mall, CNM [facility] 10/20/2021, 8:54 AM

## 2021-10-20 NOTE — Discharge Summary (Signed)
? ?  Postpartum Discharge Summary ? ?Date of Service updated 10/21/2021 ? ?   ?Patient Name: Daisy Douglas ?DOB: 10/28/1995 ?MRN: [id number] ? ?Date of admission: 10/19/2021 ?Delivery date:10/20/2021  ?Delivering provider: HARRIS, ROBERT P  ?Date of discharge: 10/21/2021 ? ?Admitting diagnosis: Uterine contractions [O47.9] ?Intrauterine pregnancy: [redacted]w[redacted]d     ?Secondary diagnosis:  Principal Problem: ?  Normal labor and delivery ?Active Problems: ?  Gestational diabetes mellitus (GDM) affecting first pregnancy ?  Uterine contractions ?  Prolonged rupture of membranes ? ?Additional problems: NA    ?Discharge diagnosis: Term Pregnancy Delivered and GDM A1                                              ?Post partum procedures:rhogam ?Augmentation: Pitocin ?Complications: None ? ?Hospital course: Onset of Labor With Vaginal Delivery      ?25 y.o. yo G1P0000 at [redacted]w[redacted]d was admitted in Latent Labor on 10/19/2021. Patient had an uncomplicated labor course as follows:  ?Membrane Rupture Time/Date: 1:15 AM ,10/19/2021   ?Delivery Method:Vaginal, Spontaneous  ?Episiotomy: None  ?Lacerations:  1st degree  ?Patient had an uncomplicated postpartum course.  She is ambulating, tolerating a regular diet, passing flatus, and urinating well. Breastfeeding independently. Patient is discharged home in stable condition on 10/21/21. ? ?Newborn Data: ?Birth date:10/20/2021  ?Birth time:4:11 AM  ?Gender:Female  ?Living status:Living  ?Apgars:9 ,9  ?Weight:3240 g  ? ?Magnesium Sulfate received: No ?BMZ received: No ?Rhophylac:Yes ?MMR:No ?T-DaP:Given postpartum ?Flu: N/A ?Transfusion:No ? ?Physical exam  ?Vitals:  ? 10/20/21 1920 10/21/21 0035 10/21/21 0311 10/21/21 0740  ?BP: 126/75 122/70 125/74 125/87  ?Pulse: 89 81 92 96  ?Resp: 18 18 18 18  ?Temp: 98.5 ?F (36.9 ?C) 97.9 ?F (36.6 ?C) 98.3 ?F (36.8 ?C) 98.3 ?F (36.8 ?C)  ?TempSrc:  Oral    ?SpO2: 99% 99% 99% 98%  ?Weight:      ?Height:      ? ?General: alert ?Breasts: soft, nipples flat and intact, colostrum  present on Right nipple  ?Lochia: appropriate ?Uterine Fundus: firm ?Incision: N/A ?Perineum: slightly swollen  ?Extremities: trace BLE R>L  ?DVT Evaluation: No evidence of DVT seen on physical exam. ?Labs: ?Lab Results  ?Component Value Date  ? WBC 16.4 (H) 10/20/2021  ? HGB 7.9 (L) 10/20/2021  ? HCT 25.4 (L) 10/20/2021  ? MCV 78.6 (L) 10/20/2021  ? PLT 179 10/20/2021  ? ?CMP Latest Ref Rng & Units 10/19/2021  ?Glucose 70 - 99 mg/dL 98  ?BUN 6 - 20 mg/dL 6  ?Creatinine 0.44 - 1.00 mg/dL 0.58  ?Sodium 135 - 145 mmol/L 137  ?Potassium 3.5 - 5.1 mmol/L 3.9  ?Chloride 98 - 111 mmol/L 106  ?CO2 22 - 32 mmol/L 22  ?Calcium 8.9 - 10.3 mg/dL 9.3  ?Total Protein 6.5 - 8.1 g/dL 6.8  ?Total Bilirubin 0.3 - 1.2 mg/dL 0.5  ?Alkaline Phos 38 - 126 U/L 180(H)  ?AST 15 - 41 U/L 20  ?ALT 0 - 44 U/L 14  ? ?Edinburgh Score: ?Edinburgh Postnatal Depression Scale Screening Tool 10/21/2021  ?I have been able to laugh and see the funny side of things. 1  ?I have looked forward with enjoyment to things. 0  ?I have blamed myself unnecessarily when things went wrong. 1  ?I have been anxious or worried for no good reason. 3  ?I have felt scared or panicky for   no good reason. 0  ?Things have been getting on top of me. 2  ?I have been so unhappy that I have had difficulty sleeping. 2  ?I have felt sad or miserable. 1  ?I have been so unhappy that I have been crying. 1  ?The thought of harming myself has occurred to me. 0  ?Edinburgh Postnatal Depression Scale Total 11  ? ? ? ? ?After visit meds:  ?Allergies as of 10/21/2021   ?No Known Allergies ?  ? ?  ?Medication List  ?  ? ?TAKE these medications   ? ?acetaminophen 325 MG tablet ?Commonly known as: TYLENOL ?Take 2 tablets (650 mg total) by mouth every 4 (four) hours as needed (for pain scale < 4  OR  temperature  >/=  100.5 F). ?  ?benzocaine-Menthol 20-0.5 % Aero ?Commonly known as: DERMOPLAST ?Apply 1 application topically as needed for irritation (perineal discomfort). ?  ?coconut oil  Oil ?Apply 1 application topically as needed. ?  ?dibucaine 1 % Oint ?Commonly known as: NUPERCAINAL ?Place 1 application rectally as needed for hemorrhoids. ?  ?ibuprofen 600 MG tablet ?Commonly known as: ADVIL ?Take 1 tablet (600 mg total) by mouth every 6 (six) hours. ?  ?prenatal multivitamin Tabs tablet ?Take 1 tablet by mouth daily at 12 noon. ?  ?simethicone 80 MG chewable tablet ?Commonly known as: MYLICON ?Chew 1 tablet (80 mg total) by mouth as needed for flatulence. ?  ?witch hazel-glycerin pad ?Commonly known as: TUCKS ?Apply 1 application topically as needed for hemorrhoids. ?  ? ?  ? ?  ?  ? ? ?  ?Discharge Care Instructions  ?(From admission, onward)  ?  ? ? ?  ? ?  Start     Ordered  ? 10/21/21 0000  Discharge wound care:       ?Comments: SHOWER DAILY ?Wash incision gently with soap and water.  ?Call office with any drainage, redness, or firmness of the incision.  ? 10/21/21 1057  ? ?  ?  ? ?  ? ? ? ?Discharge home in stable condition ?Infant Feeding: Breast ?Infant Disposition:home with mother ?Discharge instruction: per After Visit Summary and Postpartum booklet. ?Activity: Advance as tolerated. Pelvic rest for 6 weeks.  ?Diet: routine diet ?Iron deficiency anemia H/H 7.9/25.4: FE supplementation  ?Anticipated Birth Control: Unsure ?Postpartum Appointment:6 weeks ?Additional Postpartum F/U: BP check 1 week ?Future Appointments: ?Future Appointments  ?Date Time Provider Department Center  ?11/23/2021 11:00 AM Kowalski, David C, MD [facility] None  ? ?Follow up Visit: ? Follow-up Information   ? ? Harris, Robert P, MD. Schedule an appointment as soon as possible for a visit in 6 week(s).   ?Specialty: Obstetrics and Gynecology ?Why: For Post Op ?Contact information: ?[address] ?[place] [address] ?[phone] ? ? ?  ?  ? ?  ?  ? ?  ? ? ? ?  ? ?10/21/2021 ?LYDIA M DOMINIC, CNM ? ? ?

## 2021-10-20 NOTE — Lactation Note (Addendum)
This note was copied from a baby's chart. ?Lactation Consultation Note ? ?Patient Name: Boy Pernella Ackerley ?[id number] Date: 10/20/2021 ?Reason for consult: Initial assessment;Primapara;Early term 37-38.6wks ?Age:24 hours ? ?Maternal Data ?Has patient been taught Hand Expression?: Yes ?Does the patient have breastfeeding experience prior to this delivery?: No ? ?Feeding ?Mother's Current Feeding Choice: Breast Milk ?Assisted mom with latching baby to right breast in side lying position, baby latched easily and is nursing well with little stimulation, swallows heard  ?LATCH Score ?Latch: Grasps breast easily, tongue down, lips flanged, rhythmical sucking. ? ?Audible Swallowing: Spontaneous and intermittent ? ?Type of Nipple: Everted at rest and after stimulation ? ?Comfort (Breast/Nipple): Soft / non-tender ? ?Hold (Positioning): Assistance needed to correctly position infant at breast and maintain latch. ? ?LATCH Score: 9 ? ? ?Lactation Tools Discussed/Used ? LC name and no written on white board ? ?Interventions ?Interventions: Assisted with latch;Breast feeding basics reviewed;Skin to skin;Hand express;Adjust position;Education ? ?Discharge ?Pump: Personal ?WIC Program: Yes ? ?Consult Status ?Consult Status: PRN ? ? ? ?Dyann Kief ?10/20/2021, 12:10 PM ? ? ? ?

## 2021-10-21 LAB — FETAL SCREEN: Fetal Screen: NEGATIVE

## 2021-10-21 MED ORDER — TETANUS-DIPHTH-ACELL PERTUSSIS 5-2.5-18.5 LF-MCG/0.5 IM SUSY
0.5000 mL | PREFILLED_SYRINGE | Freq: Once | INTRAMUSCULAR | Status: AC
  Administered 2021-10-21: 0.5 mL via INTRAMUSCULAR
  Filled 2021-10-21: qty 0.5

## 2021-10-21 MED ORDER — DIBUCAINE (PERIANAL) 1 % EX OINT: 1.0000 "application " | TOPICAL_OINTMENT | CUTANEOUS | Status: DC | PRN

## 2021-10-21 MED ORDER — RHO D IMMUNE GLOBULIN 1500 UNIT/2ML IJ SOSY
300.0000 ug | PREFILLED_SYRINGE | Freq: Once | INTRAMUSCULAR | Status: AC
Start: 2021-10-21 — End: 2021-10-21
  Administered 2021-10-21: 300 ug via INTRAMUSCULAR
  Filled 2021-10-21: qty 2

## 2021-10-21 MED ORDER — SIMETHICONE 80 MG PO CHEW: 80.0000 mg | CHEWABLE_TABLET | ORAL | 0 refills | Status: DC | PRN

## 2021-10-21 MED ORDER — ACETAMINOPHEN 325 MG PO TABS
650.0000 mg | ORAL_TABLET | ORAL | Status: DC | PRN
Start: 2021-10-21 — End: 2023-04-10

## 2021-10-21 MED ORDER — IBUPROFEN 600 MG PO TABS: 600.0000 mg | ORAL_TABLET | Freq: Four times a day (QID) | ORAL | 0 refills | Status: DC

## 2021-10-21 MED ORDER — COCONUT OIL OIL: 1.0000 "application " | TOPICAL_OIL | 0 refills | Status: DC | PRN

## 2021-10-21 MED ORDER — BENZOCAINE-MENTHOL 20-0.5 % EX AERO: 1.0000 "application " | INHALATION_SPRAY | CUTANEOUS | Status: DC | PRN

## 2021-10-21 MED ORDER — WITCH HAZEL-GLYCERIN EX PADS: 1.0000 "application " | MEDICATED_PAD | CUTANEOUS | 12 refills | Status: DC | PRN

## 2021-10-21 NOTE — Anesthesia Postprocedure Evaluation (Addendum)
Anesthesia Post Note ? ?Patient: Daisy Douglas ? ?Procedure(s) Performed: AN AD HOC LABOR EPIDURAL ? ?Patient location during evaluation: Mother Baby ?Anesthesia Type: Epidural ?Level of consciousness: awake and alert ?Pain management: pain level controlled ?Vital Signs Assessment: post-procedure vital signs reviewed and stable ?Respiratory status: spontaneous breathing, nonlabored ventilation and respiratory function stable ?Cardiovascular status: stable ?Postop Assessment: no headache, no backache and epidural receding ?Anesthetic complications: no ? ? ?No notable events documented. ? ? ?Last Vitals:  ?Vitals:  ? 10/21/21 0311 10/21/21 0740  ?BP: 125/74 125/87  ?Pulse: 92 96  ?Resp: 18 18  ?Temp: 36.8 ?C 36.8 ?C  ?SpO2: 99% 98%  ?  ?Last Pain:  ?Vitals:  ? 10/21/21 0035  ?TempSrc: Oral  ?PainSc:   ? ? ?  ?  ?  ?  ?  ?  ? ?Hedda Slade ? ? ? ? ?

## 2021-10-21 NOTE — TOC Initial Note (Signed)
Transition of Care (TOC) - Initial/Assessment Note  ? ? ?Patient Details  ?Name: Daisy Douglas ?MRN: [id number] ?Date of Birth: [birth year] ? ?Transition of Care (TOC) CM/SW Contact:    ?Schurz, LCSWA ?Phone Number: ?10/21/2021, 10:11 AM ? ?Clinical Narrative:                 ? ?Patient presenting for labor and delivery. TOC consult for high Edinburgh depression scale result. CSW spoke to patient at bedside and patient confirmed history of depression and anxiety. Patient denied current depression but reported current feelings of stress due to finances and moving. Patient reported having essential baby items with plans to obtain a crib soon. Patient reported plans to arrange PCP. Patient reported having inconsistent support from family. CSW offered mental health resources and patient denied.  ?  ? ? ?Patient Goals and CMS Choice ?  ?  ?  ? ?Expected Discharge Plan and Services ?  ?  ?  ?  ?  ?                ?  ?  ?  ?  ?  ?  ?  ?  ?  ?  ? ?Prior Living Arrangements/Services ?  ?  ?  ?       ?  ?  ?  ?  ? ?Activities of Daily Living ?Home Assistive Devices/Equipment: None ?ADL Screening (condition at time of admission) ?Patient's cognitive ability adequate to safely complete daily activities?: Yes ?Is the patient deaf or have difficulty hearing?: No ?Does the patient have difficulty seeing, even when wearing glasses/contacts?: No ?Does the patient have difficulty concentrating, remembering, or making decisions?: No ?Patient able to express need for assistance with ADLs?: Yes ?Does the patient have difficulty dressing or bathing?: No ?Independently performs ADLs?: Yes (appropriate for developmental age) ?Does the patient have difficulty walking or climbing stairs?: No ?Weakness of Legs: None ?Weakness of Arms/Hands: None ? ?Permission Sought/Granted ?  ?  ?   ?   ?   ?   ? ?Emotional Assessment ?  ?  ?  ?  ?  ?  ? ?Admission diagnosis:  Uterine contractions [O47.9] ?Patient Active Problem List  ? Diagnosis  Date Noted  ? Normal labor and delivery 10/20/2021  ? Prolonged rupture of membranes 10/20/2021  ? Uterine contractions 10/19/2021  ? Pregnancy 10/02/2021  ? Gestational diabetes mellitus (GDM) affecting first pregnancy 08/25/2021  ? Pregnancy related bilateral lower abdominal pain, antepartum 08/22/2021  ? Obesity affecting pregnancy 07/10/2021  ? Rh negative state in antepartum period 05/02/2021  ? Supervision of high-risk pregnancy 03/17/2021  ? Anxiety 01/20/2019  ? Vitamin D deficiency 01/20/2019  ? ?PCP:  Ellene Route ?Pharmacy:   ?[id number] [address], [place] [address] ?Phone: [phone] Fax: [phone] ? ? ? ? ?Social Determinants of Health (SDOH) Interventions ?  ? ?Readmission Risk Interventions ?No flowsheet data found. ? ? ?

## 2021-10-21 NOTE — Lactation Note (Signed)
This note was copied from a baby's chart. ?Lactation Consultation Note ? ?Patient Name: Daisy Douglas ?[id number] Date: 10/21/2021 ?Reason for consult: Follow-up assessment;1st time breastfeeding ?Age:24 hours ?Lactation Rounds: ?LC to the room for a visit. Mother and baby are positioned in football on the right, and baby just came off the breast. Mother states feeds are going well. LC reviewed and encouraged feeding on demand and with cues. If baby is not cueing we encourage hand expression and spoon feed to wake baby. Reviewed diaper counts for days of life and when to call Peds with questions. Reviewed "understanding Postpartum and Newborn care " booklet at bedside. Reviewed outpatient Lactation number and resources. Reviewed pacifier, pumping, and bottles are not encouraged until breastfeeding is established and going well in the first 4 weeks. Mother does have hands free DEBP at home from [employer]. Parents stated understanding with all teaching.  ? ?Maternal Data ?Has patient been taught Hand Expression?: Yes ?Does the patient have breastfeeding experience prior to this delivery?: No ? ?Feeding ?Mother's Current Feeding Choice: Breast Milk ? ?Interventions ?Interventions: Breast feeding basics reviewed;Education ? ?Discharge ?Discharge Education: Engorgement and breast care;Warning signs for feeding baby ?Pump: Personal (has a DEBP hands free) ? ?Consult Status ?Consult Status: PRN ? ?Jahmia Berrett D Chancy Claros ?10/21/2021, 4:31 PM ? ? ? ?

## 2021-10-22 LAB — RHOGAM INJECTION: Unit division: 0

## 2021-10-31 ENCOUNTER — Ambulatory Visit (INDEPENDENT_AMBULATORY_CARE_PROVIDER_SITE_OTHER): Payer: Managed Care, Other (non HMO) | Admitting: Licensed Practical Nurse

## 2021-10-31 ENCOUNTER — Other Ambulatory Visit: Payer: Self-pay

## 2021-10-31 ENCOUNTER — Encounter: Payer: Self-pay | Admitting: Licensed Practical Nurse

## 2021-10-31 VITALS — BP 140/80 | Ht 64.0 in | Wt 216.0 lb

## 2021-10-31 DIAGNOSIS — O135 Gestational [pregnancy-induced] hypertension without significant proteinuria, complicating the puerperium: Secondary | ICD-10-CM | POA: Diagnosis not present

## 2021-10-31 DIAGNOSIS — O165 Unspecified maternal hypertension, complicating the puerperium: Secondary | ICD-10-CM

## 2021-10-31 MED ORDER — NIFEDIPINE ER OSMOTIC RELEASE 30 MG PO TB24: 30.0000 mg | ORAL_TABLET | Freq: Every day | ORAL | 0 refills | Status: DC

## 2021-10-31 NOTE — Progress Notes (Signed)
S) Here for Blood Pressure check. Developed GHTN during labor, labs WNL. SVB on 3/3.  ?Reports having a Headache and some blurry vision during the first few days home, she feels it was related to not sleeping or eating regularly, the HA went away on their own. Sleeps about 5 hours a day.  Reports moods is ok, is irritable. Did have one panic attack in which she called her father-he stayed on the phone while she calmed down. Has a hx of anxiety. Her partner is trying his best to help out, [facility] feels he could be doing more. This leads to some frustration. Daisy Douglas eats one big meal a day and perhaps some snacks, does not have a big appetite, not sure if she is not hungry or if the anxiety is making her not interested in eating. Breastfeeding going well. Is also pumping, expresses about 4oz each time.  ? ?O) ?BP 140/80   Ht 5\' 4"  (1.626 m)   Wt 216 lb (98 kg)   LMP 01/21/2021 (Exact Date)   Breastfeeding Yes   BMI 37.08 kg/m?  ? ?BP 120's/70's-87's while in the hospital PP ? ?GEN: NAD, well groomed ? ?A) postpartum hypertension ?Anxiety ? ?P) Procardia 30mg XL daily, check BP twice a day call with any >150/90 ?Return in 1 week for BP and Mood Check  ? ?03/23/2021, CNM  ? , [facility]  ?10/31/21  ?10:59 AM  ? ?

## 2021-11-07 ENCOUNTER — Telehealth: Payer: Self-pay

## 2021-11-07 NOTE — Telephone Encounter (Signed)
Pt calling; wants to p/u copy of her FMLA. [phone] ?

## 2021-11-12 ENCOUNTER — Other Ambulatory Visit: Payer: Self-pay | Admitting: Licensed Practical Nurse

## 2021-11-12 DIAGNOSIS — O165 Unspecified maternal hypertension, complicating the puerperium: Secondary | ICD-10-CM

## 2021-11-13 ENCOUNTER — Encounter: Payer: Self-pay | Admitting: Licensed Practical Nurse

## 2021-11-17 ENCOUNTER — Ambulatory Visit: Payer: Managed Care, Other (non HMO) | Admitting: Advanced Practice Midwife

## 2021-11-22 ENCOUNTER — Ambulatory Visit: Payer: Managed Care, Other (non HMO) | Admitting: Advanced Practice Midwife

## 2021-11-23 ENCOUNTER — Ambulatory Visit (INDEPENDENT_AMBULATORY_CARE_PROVIDER_SITE_OTHER): Payer: Managed Care, Other (non HMO) | Admitting: Advanced Practice Midwife

## 2021-11-23 ENCOUNTER — Encounter: Payer: Self-pay | Admitting: Advanced Practice Midwife

## 2021-11-23 ENCOUNTER — Ambulatory Visit (INDEPENDENT_AMBULATORY_CARE_PROVIDER_SITE_OTHER): Payer: Managed Care, Other (non HMO) | Admitting: Dermatology

## 2021-11-23 ENCOUNTER — Ambulatory Visit: Payer: Managed Care, Other (non HMO) | Admitting: Advanced Practice Midwife

## 2021-11-23 DIAGNOSIS — L810 Postinflammatory hyperpigmentation: Secondary | ICD-10-CM

## 2021-11-23 DIAGNOSIS — R21 Rash and other nonspecific skin eruption: Secondary | ICD-10-CM

## 2021-11-23 DIAGNOSIS — L309 Dermatitis, unspecified: Secondary | ICD-10-CM | POA: Diagnosis not present

## 2021-11-23 DIAGNOSIS — F418 Other specified anxiety disorders: Secondary | ICD-10-CM

## 2021-11-23 DIAGNOSIS — Z30011 Encounter for initial prescription of contraceptive pills: Secondary | ICD-10-CM

## 2021-11-23 DIAGNOSIS — O99345 Other mental disorders complicating the puerperium: Secondary | ICD-10-CM

## 2021-11-23 DIAGNOSIS — Z0130 Encounter for examination of blood pressure without abnormal findings: Secondary | ICD-10-CM

## 2021-11-23 MED ORDER — HYDROXYZINE HCL 25 MG PO TABS: 25.0000 mg | ORAL_TABLET | Freq: Four times a day (QID) | ORAL | 2 refills | Status: DC | PRN

## 2021-11-23 MED ORDER — NORETHINDRONE 0.35 MG PO TABS
1.0000 | ORAL_TABLET | Freq: Every day | ORAL | 11 refills | Status: DC
Start: 2021-11-23 — End: 2023-04-10

## 2021-11-23 NOTE — Progress Notes (Signed)
? ?Patient ID: Daisy Douglas, female   DOB: 30-Dec-1995, 26 y.o.   MRN: [id number] ? ?Reason for Visit: No chief complaint on file. ? ? ?Subjective:  ?HPI: ? ?Daisy Douglas is a 26 y.o. female being seen at 4 weeks postpartum for BP and mood check. She missed an appointment a week ago for the same. Currently her BP is normal and she discontinued Procardia about a week ago. Reassurance given today that she can stop taking the medication. She reports an increase in anxiety postpartum. She has some support from her partner and limited otherwise. Sleep is less due to newborn. She is both breast and formula feeding. She reports ongoing vaginal discharge at times- pink/brown. It is decreasing and she wonders what normal is. We discussed the importance of sleep and support postpartum and will also send Rx of Atarax PRN. She will have 6 week visit in 2 weeks. ? ?Past Medical History:  ?Diagnosis Date  ? Dysmenorrhea   ? Gestational diabetes   ? Pre-diabetes   ? Type O blood, Rh negative 01/2021  ? Vitamin D deficiency   ? ?Family History  ?Problem Relation Age of Onset  ? Diabetes Mother   ? Hypertension Mother   ? Diabetes Father   ? Hypertension Father   ? Diabetes Sister   ? Thyroid disease Sister   ? Breast cancer Neg Hx   ? Ovarian cancer Neg Hx   ? ?Past Surgical History:  ?Procedure Laterality Date  ? NO PAST SURGERIES    ? ? ?Short Social History:  ?Social History  ? ?Tobacco Use  ? Smoking status: Never  ? Smokeless tobacco: Never  ?Substance Use Topics  ? Alcohol use: Never  ? ? ?No Known Allergies ? ?Current Outpatient Medications  ?Medication Sig Dispense Refill  ? coconut oil OIL Apply 1 application topically as needed.  0  ? hydrOXYzine (ATARAX) 25 MG tablet Take 1 tablet (25 mg total) by mouth every 6 (six) hours as needed for itching. 30 tablet 2  ? ibuprofen (ADVIL) 600 MG tablet Take 1 tablet (600 mg total) by mouth every 6 (six) hours. 30 tablet 0  ? norethindrone (MICRONOR) 0.35 MG tablet Take 1 tablet  (0.35 mg total) by mouth daily. 30 tablet 11  ? Prenatal Vit-Fe Fumarate-FA (PRENATAL MULTIVITAMIN) TABS tablet Take 1 tablet by mouth daily at 12 noon.    ? acetaminophen (TYLENOL) 325 MG tablet Take 2 tablets (650 mg total) by mouth every 4 (four) hours as needed (for pain scale < 4  OR  temperature  >/=  100.5 F). (Patient not taking: Reported on 11/23/2021)    ? benzocaine-Menthol (DERMOPLAST) 20-0.5 % AERO Apply 1 application topically as needed for irritation (perineal discomfort). (Patient not taking: Reported on 11/23/2021)    ? dibucaine (NUPERCAINAL) 1 % OINT Place 1 application rectally as needed for hemorrhoids. (Patient not taking: Reported on 11/23/2021)    ? NIFEdipine (PROCARDIA-XL/NIFEDICAL-XL) 30 MG 24 hr tablet Take 1 tablet (30 mg total) by mouth daily. Can increase to twice a day as needed for symptomatic contractions (Patient not taking: Reported on 11/23/2021) 30 tablet 0  ? simethicone (MYLICON) 80 MG chewable tablet Chew 1 tablet (80 mg total) by mouth as needed for flatulence. 30 tablet 0  ? witch hazel-glycerin (TUCKS) pad Apply 1 application topically as needed for hemorrhoids. 40 each 12  ? ?No current facility-administered medications for this visit.  ? ? ?Review of Systems  ?Constitutional:  Negative for chills and fever.  ?  HENT:  Negative for congestion, ear discharge, ear pain, hearing loss, sinus pain and sore throat.   ?Eyes:  Negative for blurred vision and double vision.  ?Respiratory:  Negative for cough, shortness of breath and wheezing.   ?Cardiovascular:  Negative for chest pain, palpitations and leg swelling.  ?Gastrointestinal:  Negative for abdominal pain, blood in stool, constipation, diarrhea, heartburn, melena, nausea and vomiting.  ?Genitourinary:  Negative for dysuria, flank pain, frequency, hematuria and urgency.  ?     Positive for continued discharge since delivery  ?Musculoskeletal:  Negative for back pain, joint pain and myalgias.  ?Skin:  Negative for itching and rash.   ?Neurological:  Negative for dizziness, tingling, tremors, sensory change, speech change, focal weakness, seizures, loss of consciousness, weakness and headaches.  ?Endo/Heme/Allergies:  Negative for environmental allergies. Does not bruise/bleed easily.  ?Psychiatric/Behavioral:  Positive for depression. Negative for hallucinations, memory loss, substance abuse and suicidal ideas. The patient is not nervous/anxious and does not have insomnia.   ?     Positive for anxiety  ? ? ? ?   ?Objective:  ?Objective  ? ?Vitals:  ? 11/23/21 0922  ?BP: 122/70  ?Weight: 212 lb (96.2 kg)  ?Height: 5\' 4"  (1.626 m)  ? ?Body mass index is 36.39 kg/m? ?Constitutional: Well nourished, well developed female in no acute distress.  ?HEENT: normal ?Skin: Warm and dry.  ?Extremity:  no edema   ?Respiratory:  Normal respiratory effort ?Neuro: DTRs 2+, Cranial nerves grossly intact ?Psych: Alert and Oriented x3. No memory deficits. Normal mood and affect.  ? ? ?  11/23/2021  ?  9:53 AM  ?GAD 7 : Generalized Anxiety Score  ?Nervous, Anxious, on Edge 3  ?Control/stop worrying 2  ?Worry too much - different things 2  ?Trouble relaxing 2  ?Restless 1  ?Easily annoyed or irritable 2  ?Afraid - awful might happen 1  ?Total GAD 7 Score 13  ?Anxiety Difficulty Somewhat difficult  ? ?  ? ?  11/23/2021  ?  9:52 AM 09/01/2021  ?  1:32 PM 03/07/2020  ? 11:24 AM  ?Depression screen PHQ 2/9  ?Decreased Interest 1 2 1   ?Down, Depressed, Hopeless 2 2 1   ?PHQ - 2 Score 3 4 2   ?Altered sleeping 2 1   ?Tired, decreased energy 1 3   ?Change in appetite 3 1   ?Feeling bad or failure about yourself  1 0   ?Trouble concentrating 1 1   ?Moving slowly or fidgety/restless 0 0   ?Suicidal thoughts 0 0   ?PHQ-9 Score 11 10   ?Difficult doing work/chores Somewhat difficult Somewhat difficult   ?  ? ? ?Assessment/Plan:  ?  ? ?26 y.o. G1 P1 female, normal blood pressure 4 weeks postpartum, anxiety postpartum ? ?Rx POP birth control ?Rx Atarax PRN ?Return to clinic in 2 weeks  for 6 week postpartum visit ? ? ? CNM ?[facility] ?[facility] ?11/23/2021, 10:14 AM ? ? ?

## 2021-11-23 NOTE — Patient Instructions (Signed)
Managing Anxiety, Adult ?After being diagnosed with anxiety, you may be relieved to know why you have felt or behaved a certain way. You may also feel overwhelmed about the treatment ahead and what it will mean for your life. With care and support, you can manage this condition. ?How to manage lifestyle changes ?Managing stress and anxiety ?Stress is your body's reaction to life changes and events, both good and bad. Most stress will last just a few hours, but stress can be ongoing and can lead to more than just stress. Although stress can play a major role in anxiety, it is not the same as anxiety. Stress is usually caused by something external, such as a deadline, test, or competition. Stress normally passes after the triggering event has ended.  ?Anxiety is caused by something internal, such as imagining a terrible outcome or worrying that something will go wrong that will devastate you. Anxiety often does not go away even after the triggering event is over, and it can become long-term (chronic) worry. It is important to understand the differences between stress and anxiety and to manage your stress effectively so that it does not lead to an anxious response. ?Talk with your health care provider or a counselor to learn more about reducing anxiety and stress. He or she may suggest tension reduction techniques, such as: ?Music therapy. Spend time creating or listening to music that you enjoy and that inspires you. ?Mindfulness-based meditation. Practice being aware of your normal breaths while not trying to control your breathing. It can be done while sitting or walking. ?Centering prayer. This involves focusing on a word, phrase, or sacred image that means something to you and brings you peace. ?Deep breathing. To do this, expand your stomach and inhale slowly through your nose. Hold your breath for 3-5 seconds. Then exhale slowly, letting your stomach muscles relax. ?Self-talk. Learn to notice and identify  thought patterns that lead to anxiety reactions and change those patterns to thoughts that feel peaceful. ?Muscle relaxation. Taking time to tense muscles and then relax them. ?Choose a tension reduction technique that fits your lifestyle and personality. These techniques take time and practice. Set aside 5-15 minutes a day to do them. Therapists can offer counseling and training in these techniques. The training to help with anxiety may be covered by some insurance plans. ?Other things you can do to manage stress and anxiety include: ?Keeping a stress diary. This can help you learn what triggers your reaction and then learn ways to manage your response. ?Thinking about how you react to certain situations. You may not be able to control everything, but you can control your response. ?Making time for activities that help you relax and not feeling guilty about spending your time in this way. ?Doing visual imagery. This involves imagining or creating mental pictures to help you relax. ?Practicing yoga. Through yoga poses, you can lower tension and promote relaxation. ? ?Medicines ?Medicines can help ease symptoms. Medicines for anxiety include: ?Antidepressant medicines. These are usually prescribed for long-term daily control. ?Anti-anxiety medicines. These may be added in severe cases, especially when panic attacks occur. ?Medicines will be prescribed by a health care provider. When used together, medicines, psychotherapy, and tension reduction techniques may be the most effective treatment. ?Relationships ?Relationships can play a big part in helping you recover. Try to spend more time connecting with trusted friends and family members. ?Consider going to couples counseling if you have a partner, taking family education classes, or going to family   therapy. ?Therapy can help you and others better understand your condition. ?How to recognize changes in your anxiety ?Everyone responds differently to treatment for  anxiety. Recovery from anxiety happens when symptoms decrease and stop interfering with your daily activities at home or work. This may mean that you will start to: ?Have better concentration and focus. Worry will interfere less in your daily thinking. ?Sleep better. ?Be less irritable. ?Have more energy. ?Have improved memory. ?It is also important to recognize when your condition is getting worse. Contact your health care provider if your symptoms interfere with home or work and you feel like your condition is not improving. ?Follow these instructions at home: ?Activity ?Exercise. Adults should do the following: ?Exercise for at least 150 minutes each week. The exercise should increase your heart rate and make you sweat (moderate-intensity exercise). ?Strengthening exercises at least twice a week. ?Get the right amount and quality of sleep. Most adults need 7-9 hours of sleep each night. ?Lifestyle ? ?Eat a healthy diet that includes plenty of vegetables, fruits, whole grains, low-fat dairy products, and lean protein. ?Do not eat a lot of foods that are high in fats, added sugars, or salt (sodium). ?Make choices that simplify your life. ?Do not use any products that contain nicotine or tobacco. These products include cigarettes, chewing tobacco, and vaping devices, such as e-cigarettes. If you need help quitting, ask your health care provider. ?Avoid caffeine, alcohol, and certain over-the-counter cold medicines. These may make you feel worse. Ask your pharmacist which medicines to avoid. ?General instructions ?Take over-the-counter and prescription medicines only as told by your health care provider. ?Keep all follow-up visits. This is important. ?Where to find support ?You can get help and support from these sources: ?Self-help groups. ?Online and community organizations. ?A trusted spiritual leader. ?Couples counseling. ?Family education classes. ?Family therapy. ?Where to find more information ?You may find  that joining a support group helps you deal with your anxiety. The following sources can help you locate counselors or support groups near you: ?Mental Health America: www.mentalhealthamerica.net ?Anxiety and Depression Association of America (ADAA): www.adaa.org ?National Alliance on Mental Illness (NAMI): www.nami.org ?Contact a health care provider if: ?You have a hard time staying focused or finishing daily tasks. ?You spend many hours a day feeling worried about everyday life. ?You become exhausted by worry. ?You start to have headaches or frequently feel tense. ?You develop chronic nausea or diarrhea. ?Get help right away if: ?You have a racing heart and shortness of breath. ?You have thoughts of hurting yourself or others. ?If you ever feel like you may hurt yourself or others, or have thoughts about taking your own life, get help right away. Go to your nearest emergency department or: ?Call your local emergency services (911 in the [place] ?Call a suicide crisis helpline, such as the National Suicide Prevention Lifeline at 1-800-273-8255 or 988 in the [place] This is open 24 hours a day in the [place] ?Text the Crisis Text Line at 741741 (in the [place] ?Summary ?Taking steps to learn and use tension reduction techniques can help calm you and help prevent triggering an anxiety reaction. ?When used together, medicines, psychotherapy, and tension reduction techniques may be the most effective treatment. ?Family, friends, and partners can play a big part in supporting you. ?This information is not intended to replace advice given to you by your health care provider. Make sure you discuss any questions you have with your health care provider. ?Document Revised: 03/01/2021 Document Reviewed: 11/27/2020 ?Elsevier Patient   Education ? [place]. ?Perinatal Mood and Anxiety Disorder ?Perinatal mood and anxiety disorder (PMAD) is a mental health condition that happens when a person feels excessive sadness, anger, or  worry and tension (anxiety) during pregnancy or during the first few months after the birth. This condition can last a few months or may continue for years if left untreated. PMAD may cause serious problems for the

## 2021-11-23 NOTE — Patient Instructions (Addendum)
?Recommend Cerave cream to use as a daily moisturizer.  ? ? ?If You Need Anything After Your Visit ? ?If you have any questions or concerns for your doctor, please call our main line at [phone] and press option 4 to reach your doctor's medical assistant. If no one answers, please leave a voicemail as directed and we will return your call as soon as possible. Messages left after 4 pm will be answered the following business day.  ? ?You may also send [place] a message via MyChart. We typically respond to MyChart messages within 1-2 business days. ? ?For prescription refills, please ask your pharmacy to contact our office. Our fax number is [phone]. ? ?If you have an urgent issue when the clinic is closed that cannot wait until the next business day, you can page your doctor at the number below.   ? ?Please note that while we do our best to be available for urgent issues outside of office hours, we are not available 24/7.  ? ?If you have an urgent issue and are unable to reach [place], you may choose to seek medical care at your doctor's office, retail clinic, urgent care center, or emergency room. ? ?If you have a medical emergency, please immediately call 911 or go to the emergency department. ? ?Pager Numbers ? ?- Dr. Nehemiah Massed: [phone] ? ?- Dr. Laurence Ferrari: [phone] ? ?- Dr. Nicole Kindred: [phone] ? ?In the event of inclement weather, please call our main line at [phone] for an update on the status of any delays or closures. ? ?Dermatology Medication Tips: ?Please keep the boxes that topical medications come in in order to help keep track of the instructions about where and how to use these. Pharmacies typically print the medication instructions only on the boxes and not directly on the medication tubes.  ? ?If your medication is too expensive, please contact our office at [phone] option 4 or send [place] a message through [place].  ? ?We are unable to tell what your co-pay for medications will be in advance  as this is different depending on your insurance coverage. However, we may be able to find a substitute medication at lower cost or fill out paperwork to get insurance to cover a needed medication.  ? ?If a prior authorization is required to get your medication covered by your insurance company, please allow [place] 1-2 business days to complete this process. ? ?Drug prices often vary depending on where the prescription is filled and some pharmacies may offer cheaper prices. ? ?The website www.goodrx.com contains coupons for medications through different pharmacies. The prices here do not account for what the cost may be with help from insurance (it may be cheaper with your insurance), but the website can give you the price if you did not use any insurance.  ?- You can print the associated coupon and take it with your prescription to the pharmacy.  ?- You may also stop by our office during regular business hours and pick up a GoodRx coupon card.  ?- If you need your prescription sent electronically to a different pharmacy, notify our office through [facility] or by phone at [phone] option 4. ? ? ? ? ?Si Usted Necesita Algo Despu?s de Su Visita ? ?Tambi?n puede enviarnos un mensaje a trav?s de MyChart. Por lo general respondemos a los mensajes de MyChart en el transcurso de 1 a 2 d?as h?biles. ? ?Para renovar recetas, por favor pida a su farmacia que se ponga en contacto con  nuestra oficina. Nuestro n?mero de fax es el [phone]. ? ?Si tiene un asunto urgente cuando la cl?nica est? cerrada y que no puede esperar hasta el siguiente d?a h?bil, puede llamar/localizar a su doctor(a) al n?mero que aparece a continuaci?n.  ? ?Por favor, tenga en cuenta que aunque hacemos todo lo posible para estar disponibles para asuntos urgentes fuera del horario de oficina, no estamos disponibles las 24 horas del d?a, los 7 d?as de la semana.  ? ?Si tiene un problema urgente y no puede comunicarse con nosotros, puede optar  por buscar atenci?n m?dica  en el consultorio de su doctor(a), en una cl?nica privada, en un centro de atenci?n urgente o en una sala de emergencias. ? ?Si tiene Engineer, maintenance (IT) m?dica, por favor llame inmediatamente al 911 o vaya a la sala de emergencias. ? ?N?meros de b?per ? ?- Dr. Nehemiah Massed: [phone] ? ?- Dra. Moye: [phone] ? ?- Dra. Nicole [phone] ? ?En caso de inclemencias del tiempo, por favor llame a nuestra l?nea principal al [phone] para una actualizaci?n sobre el estado de cualquier retraso o cierre. ? ?Consejos para la medicaci?n en dermatolog?a: ?Por favor, guarde las cajas en las que vienen los medicamentos de uso t?pico para ayudarle a seguir las instrucciones sobre d?nde y c?mo usarlos. Las farmacias generalmente imprimen las instrucciones del medicamento s?lo en las cajas y no directamente en los tubos del [place].  ? ?Si su medicamento es muy caro, por favor, p?ngase en contacto con Zigmund Daniel llamando al [phone] y presione la opci?n 4 o env?enos un mensaje a trav?s de MyChart.  ? ?No podemos decirle cu?l ser? su copago por los medicamentos por adelantado ya que esto es diferente dependiendo de la cobertura de su seguro. Sin embargo, es posible que podamos encontrar un medicamento sustituto a Electrical engineer un formulario para que el seguro cubra el medicamento que se considera necesario.  ? ?Si se requiere Ardelia Mems autorizaci?n previa para que su compa??a de seguros [place] su medicamento, por favor perm?tanos de 1 a 2 d?as h?biles para completar este proceso. ? ?Los precios de los medicamentos var?an con frecuencia dependiendo del Environmental consultant de d?nde se surte la receta y alguna farmacias pueden ofrecer precios m?s baratos. ? ?El sitio web www.goodrx.com tiene cupones para medicamentos de Airline pilot. Los precios aqu? no tienen en cuenta lo que podr?a costar con la ayuda del seguro (puede ser m?s barato con su seguro), pero el sitio web puede darle el precio si  no utiliz? ning?n seguro.  ?- Puede imprimir el cup?n correspondiente y llevarlo con su receta a la farmacia.  ?- Tambi?n puede pasar por nuestra oficina durante el horario de atenci?n regular y recoger una tarjeta de cupones de GoodRx.  ?- Si necesita que su receta se env?e electr?nicamente a Chiropodist, informe a nuestra oficina a trav?s de MyChart de [place] o por tel?fono llamando al [phone] y presione la opci?n 4. ? ?

## 2021-11-23 NOTE — Progress Notes (Signed)
? ?  New Patient Visit ? ?Subjective  ?Daisy Douglas is a 26 y.o. female who presents for the following: New Patient (Initial Visit) (New patient here concerning darkened areas at neck and legs and ingrown hairs at legs after shaving.). ? ?The following portions of the chart were reviewed this encounter and updated as appropriate:  ? Tobacco  Allergies  Meds  Problems  Med Hx  Surg Hx  Fam Hx   ?  ?Review of Systems:  No other skin or systemic complaints except as noted in HPI or Assessment and Plan. ? ?Objective  ?Well appearing patient in no apparent distress; mood and affect are within normal limits. ? ?A focused examination was performed including the neck and legs. Relevant physical exam findings are noted in the Assessment and Plan. ? ?neck, lower legs ?Hyperpigmentation ? ? ? ? ?bilateral lower legs ?Normal pilosebaceous units and pores.  Possibly more accentuated than the average.  Reassured is normal and nothing can be done about it. ? ?Assessment & Plan  ?Rash and other nonspecific skin eruption ?Consistent with postinflammatory pigment alteration secondary to irritant contact dermatitis or eczema. ?Also may be dyspigmentation/hyperpigmentation associated with recent pregnancy.  Advised is benign appearing. ?Neck ?Since patient is breastfeeding and postpartum at this time pt defers tx. Advised pt that Elidel or Mometasone are category C for breast-feeding and we will not treat at this time.   ?Recommend CeraVe, Cetaphil, or Eucerin moisturizers daily.  ? ?Rash ?bilateral lower legs ?Normal benign appearing pilosebaceous units and pores. ?They may be's more accentuated than the average but no treatment is available for this.  No tx needed or recommended.  ? ?Return for 1 month follow up on eczema . ? ?I, Asher Muir, CMA, am acting as scribe for Armida Sans, MD. ?Documentation: I have reviewed the above documentation for accuracy and completeness, and I agree with the above. ? ?Armida Sans,  MD ? ? ?

## 2021-11-26 ENCOUNTER — Encounter: Payer: Self-pay | Admitting: Dermatology

## 2021-12-05 ENCOUNTER — Encounter: Payer: Self-pay | Admitting: Obstetrics & Gynecology

## 2021-12-05 ENCOUNTER — Ambulatory Visit (INDEPENDENT_AMBULATORY_CARE_PROVIDER_SITE_OTHER): Payer: Managed Care, Other (non HMO) | Admitting: Obstetrics & Gynecology

## 2021-12-05 DIAGNOSIS — F419 Anxiety disorder, unspecified: Secondary | ICD-10-CM

## 2021-12-05 MED ORDER — SERTRALINE HCL 50 MG PO TABS: 50.0000 mg | ORAL_TABLET | Freq: Every day | ORAL | 5 refills | Status: DC

## 2021-12-05 NOTE — Progress Notes (Signed)
?  OBSTETRICS POSTPARTUM CLINIC PROGRESS NOTE ? ?Subjective:  ?  ? Daisy Douglas is a 26 y.o. G6P1001 female who presents for a postpartum visit. She is 6 weeks postpartum following a Term pregnancy and delivery by  [facility] .  I have fully reviewed the prenatal and intrapartum course. ?Anesthesia: epidural.  ?Postpartum course has been complicated by uncomplicated.  ?Baby is feeding by Breast.  ?Bleeding: patient has not  resumed menses.  ?Bowel function is normal. Bladder function is normal.  ?Patient is not sexually active. Contraception method desired is oral progesterone-only contraceptive.  ?Postpartum depression screening: positive. Edinburgh 15. ? ?The following portions of the patient's history were reviewed and updated as appropriate: allergies, current medications, past family history, past medical history, past social history, past surgical history, and problem list. ? ?Review of Systems ?Pertinent items are noted in HPI. ? ?Objective:  ? ? BP 100/70   Ht 5\' 4"  (1.626 m)   Wt 213 lb (96.6 kg)   Breastfeeding Yes   BMI 36.56 kg/m?   ?General:  alert and no distress  ? Breasts:  inspection negative, no nipple discharge or bleeding, no masses or nodularity palpable  ?Lungs: clear to auscultation bilaterally  ?Heart:  regular rate and rhythm, S1, S2 normal, no murmur, click, rub or gallop  ?Abdomen: soft, non-tender; bowel sounds normal; no masses,  no organomegaly.    ? Vulva:  normal  ?Vagina: normal vagina, no discharge, exudate, lesion, or erythema  ?Cervix:  no cervical motion tenderness and no lesions  ?Corpus: normal size, contour, position, consistency, mobility, non-tender  ?Adnexa:  normal adnexa and no mass, fullness, tenderness  ?Rectal Exam: Not performed.  ?      ? ? ?Assessment:  ?Post Partum Care visit ?1. Postpartum care and examination ?PAP due this summer ? ?2. Anxiety ?Start Zoloft daily ?Info given ? ? ?Plan:  ?See orders and Patient Instructions ?Contraceptive counseling for oral  progesterone-only contraceptive ?Follow up in: a few months or as needed.  ? ?Barnett Applebaum, MD, [facility] ?[facility], [place] ?12/05/2021  10:40 AM ? ?

## 2021-12-05 NOTE — Patient Instructions (Signed)
Sertraline Tablets What is this medication? SERTRALINE (SER tra leen) treats depression, anxiety, obsessive-compulsive disorder (OCD), post-traumatic stress disorder (PTSD), and premenstrual dysphoric disorder (PMDD). It increases the amount of serotonin in the brain, a hormone that helps regulate mood. It belongs to a group of medications called SSRIs. This medicine may be used for other purposes; ask your health care provider or pharmacist if you have questions. COMMON BRAND NAME(S): Zoloft What should I tell my care team before I take this medication? They need to know if you have any of these conditions: Bleeding disorders Bipolar disorder or a family history of bipolar disorder Frequently drink alcohol Glaucoma Heart disease High blood pressure History of irregular heartbeat History of low levels of calcium, magnesium, or potassium in the blood Liver disease Receiving electroconvulsive therapy Seizures Suicidal thoughts, plans, or attempt; a previous suicide attempt by you or a family member Take medications that prevent or treat blood clots Thyroid disease An unusual or allergic reaction to sertraline, other medications, foods, dyes, or preservatives Pregnant or trying to get pregnant Breast-feeding How should I use this medication? Take this medication by mouth with a glass of water. Follow the directions on the prescription label. You can take it with or without food. Take your medication at regular intervals. Do not take your medication more often than directed. Do not stop taking this medication suddenly except upon the advice of your care team. Stopping this medication too quickly may cause serious side effects or your condition may worsen. A special MedGuide will be given to you by the pharmacist with each prescription and refill. Be sure to read this information carefully each time. Talk to your care team about the use of this medication in children. While this medication may  be prescribed for children as young as 7 years for selected conditions, precautions do apply. Overdosage: If you think you have taken too much of this medicine contact a poison control center or emergency room at once. NOTE: This medicine is only for you. Do not share this medicine with others. What if I miss a dose? If you miss a dose, take it as soon as you can. If it is almost time for your next dose, take only that dose. Do not take double or extra doses. What may interact with this medication? Do not take this medication with any of the following: Cisapride Dronedarone Linezolid MAOIs like Carbex, Eldepryl, Marplan, Nardil, and Parnate Methylene blue (injected into a vein) Pimozide Thioridazine This medication may also interact with the following: Alcohol Amphetamines Aspirin and aspirin-like medications Certain medications for depression, anxiety, or other mental health conditions Certain medications for fungal infections like ketoconazole, fluconazole, posaconazole, and itraconazole Certain medications for irregular heart beat like flecainide, quinidine, propafenone Certain medications for migraine headaches like almotriptan, eletriptan, frovatriptan, naratriptan, rizatriptan, sumatriptan, zolmitriptan Certain medications for sleep Certain medications for seizures like carbamazepine, valproic acid, phenytoin Certain medications that treat or prevent blood clots like warfarin, enoxaparin, dalteparin Cimetidine Digoxin Diuretics Fentanyl Isoniazid Lithium NSAIDs, medications for pain and inflammation, like ibuprofen or naproxen Other medications that prolong the QT interval (cause an abnormal heart rhythm) like dofetilide Rasagiline Safinamide Supplements like St. John's wort, kava kava, valerian Tolbutamide Tramadol Tryptophan This list may not describe all possible interactions. Give your health care provider a list of all the medicines, herbs, non-prescription drugs, or  dietary supplements you use. Also tell them if you smoke, drink alcohol, or use illegal drugs. Some items may interact with your medicine. What should   I watch for while using this medication? Tell your care team if your symptoms do not get better or if they get worse. Visit your care team for regular checks on your progress. Because it may take several weeks to see the full effects of this medication, it is important to continue your treatment as prescribed by your care team. Patients and their families should watch out for new or worsening thoughts of suicide or depression. Also watch out for sudden changes in feelings such as feeling anxious, agitated, panicky, irritable, hostile, aggressive, impulsive, severely restless, overly excited and hyperactive, or not being able to sleep. If this happens, especially at the beginning of treatment or after a change in dose, call your care team. This medication may affect your coordination, reaction time, or judgment. Do not drive or operate machinery until you know how this medication affects you. Sit or stand up slowly to reduce the risk of dizzy or fainting spells. Drinking alcohol with this medication can increase the risk of these side effects. Your mouth may get dry. Chewing sugarless gum or sucking hard candy, and drinking plenty of water may help. Contact your care team if the problem does not go away or is severe. What side effects may I notice from receiving this medication? Side effects that you should report to your care team as soon as possible: Allergic reactions--skin rash, itching, hives, swelling of the face, lips, tongue, or throat Bleeding--bloody or black, tar-like stools, red or dark brown urine, vomiting blood or brown material that looks like coffee grounds, small red or purple spots on skin, unusual bleeding or bruising Heart rhythm changes--fast or irregular heartbeat, dizziness, feeling faint or lightheaded, chest pain, trouble  breathing Low sodium level--muscle weakness, fatigue, dizziness, headache, confusion Serotonin syndrome--irritability, confusion, fast or irregular heartbeat, muscle stiffness, twitching muscles, sweating, high fever, seizure, chills, vomiting, diarrhea Sudden eye pain or change in vision such as blurred vision, seeing halos around lights, vision loss Thoughts of suicide or self-harm, worsening mood Side effects that usually do not require medical attention (report these to your care team if they continue or are bothersome): Change in sex drive or performance Diarrhea Excessive sweating Nausea Tremors or shaking Upset stomach This list may not describe all possible side effects. Call your doctor for medical advice about side effects. You may report side effects to FDA at 1-800-FDA-1088. Where should I keep my medication? Keep out of the reach of children and pets. Store at room temperature between 15 and 30 degrees C (59 and 86 degrees F). Get rid of any unused medication after the expiration date. To get rid of medications that are no longer needed or expired: Take the medication to a medication take-back program. Check with your pharmacy or law enforcement to find a location. If you cannot return the medication, check the label or package insert to see if the medication should be thrown out in the garbage or flushed down the toilet. If you are not sure, ask your care team. If it is safe to put in the trash, empty the medication out of the container. Mix the medication with cat litter, dirt, coffee grounds, or other unwanted substance. Seal the mixture in a bag or container. Put it in the trash. NOTE: This sheet is a summary. It may not cover all possible information. If you have questions about this medicine, talk to your doctor, pharmacist, or health care provider.  2023 Elsevier/Gold Standard (2021-02-06 00:00:00)  

## 2022-01-03 ENCOUNTER — Ambulatory Visit: Payer: Managed Care, Other (non HMO) | Admitting: Dermatology

## 2022-02-16 IMAGING — US US OB FOLLOW-UP
1 of 2 series · 13 of 28 positions shown · non-contrast
Comparison: none

CLINICAL DATA: Follow-up incomplete fetal survey and fetal growth.

EXAM:
OBSTETRIC 14+ WK ULTRASOUND FOLLOW-UP

[Series 1: us ob follow-up · 0.26mm/px · 13 of 58 slices shown]
[im 3/58]
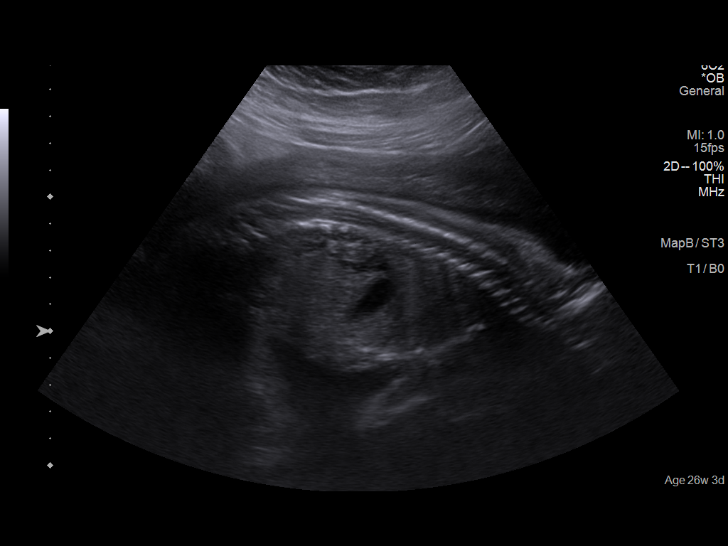
[im 7/58]
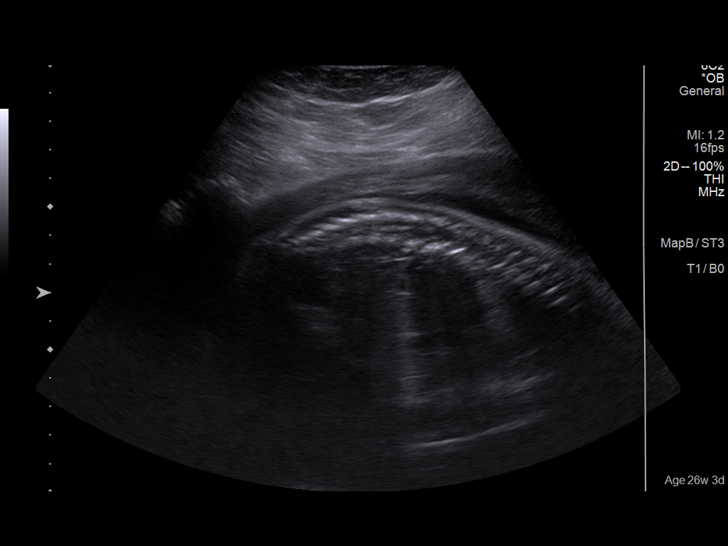
[im 11/58]
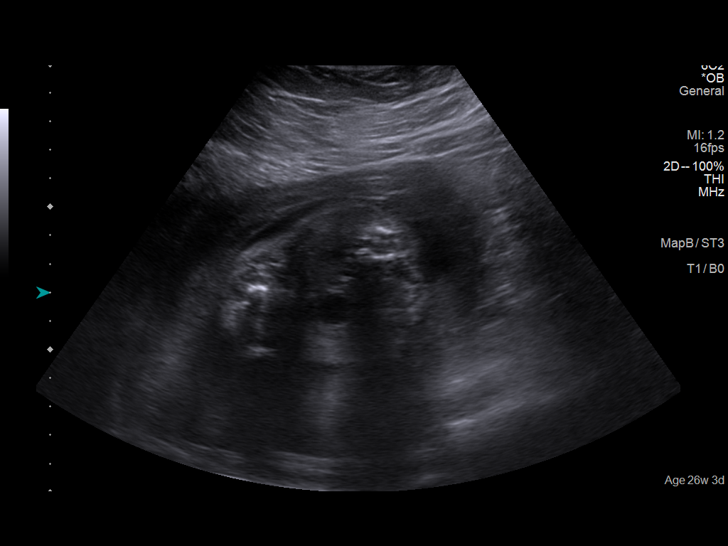
[im 16/58]
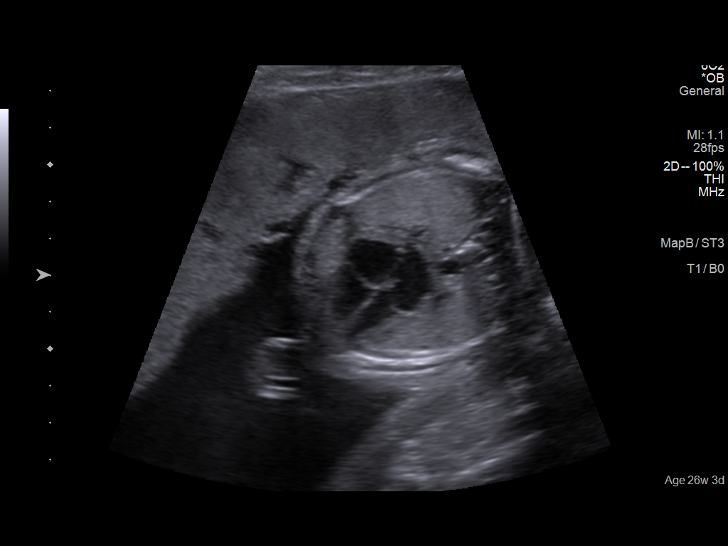
[im 20/58]
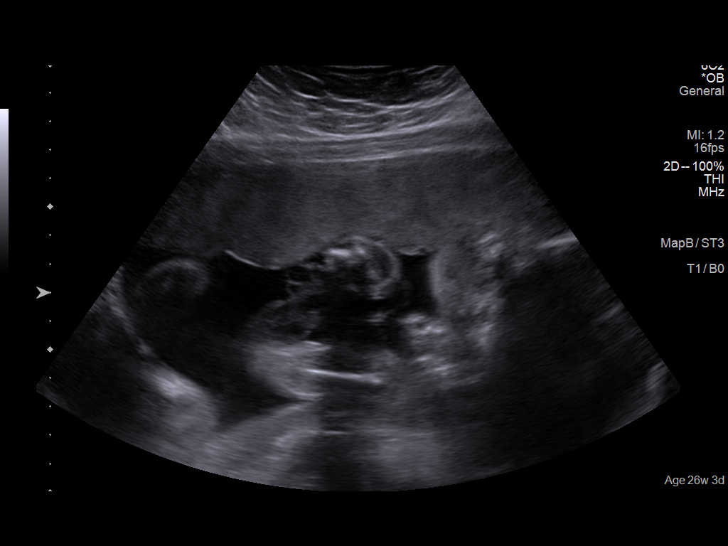
[im 25/58]
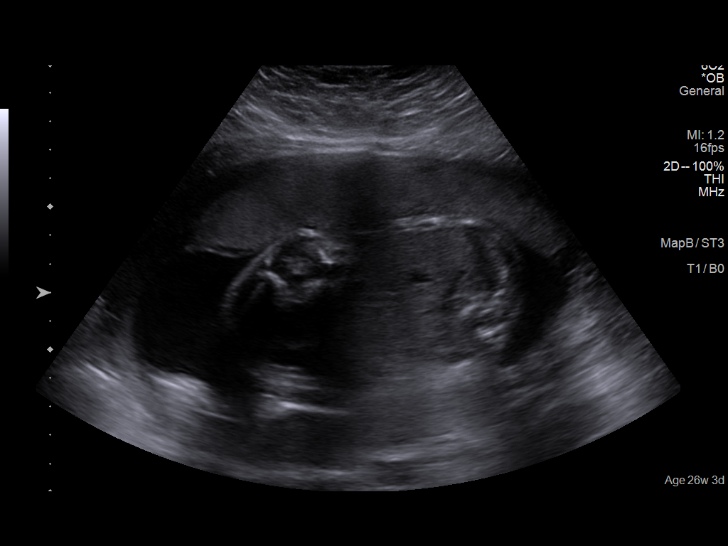
[im 31/58]
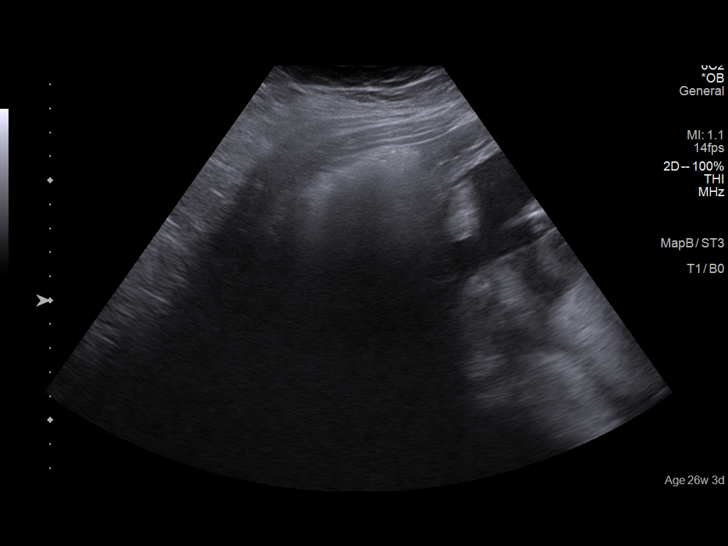
[im 36/58]
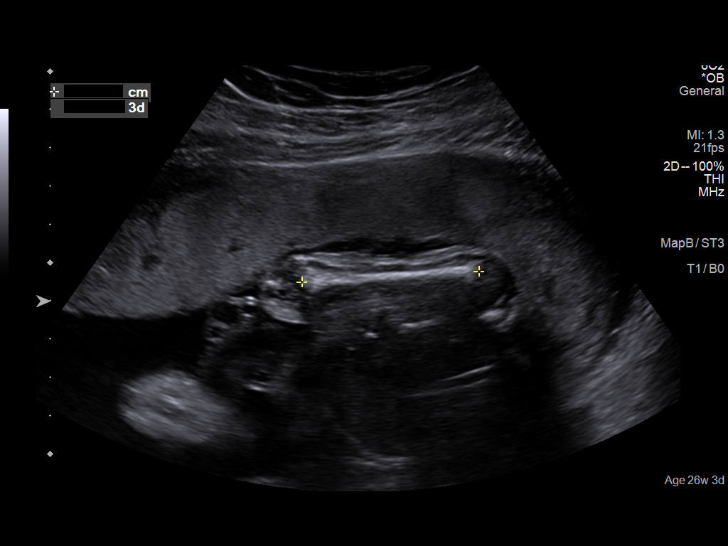
[im 40/58]
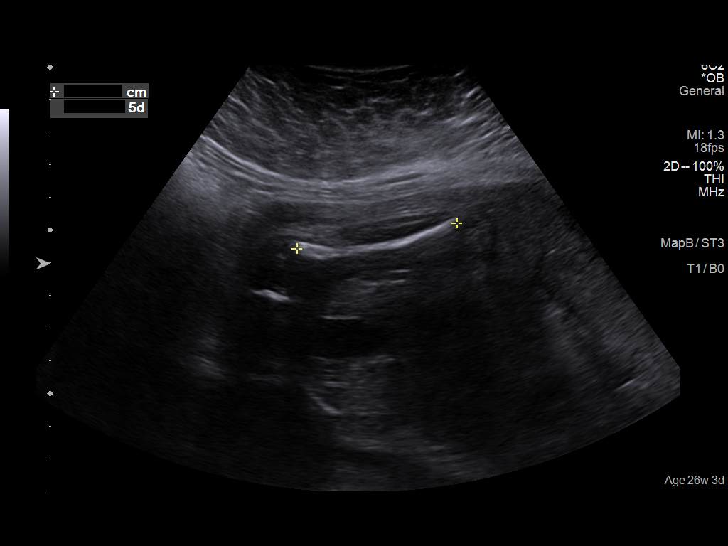
[im 44/58]
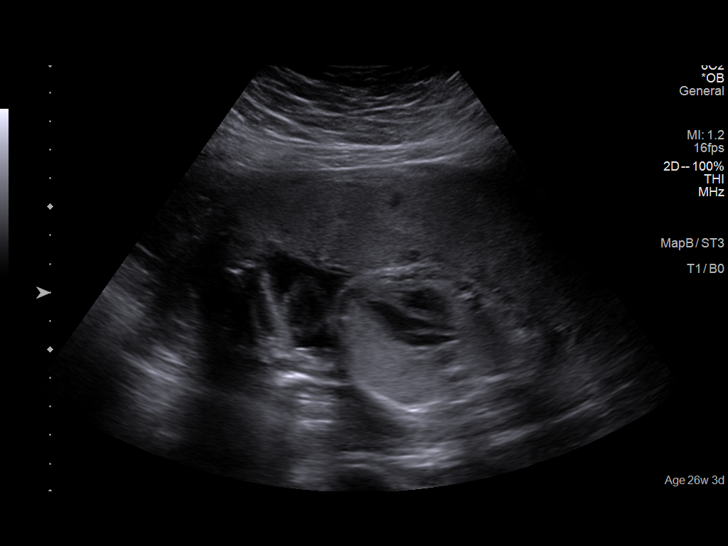
[im 49/58]
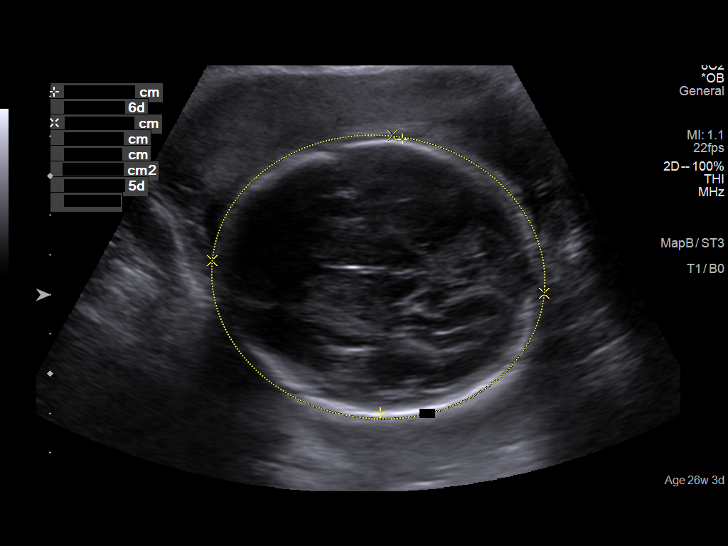
[im 53/58]
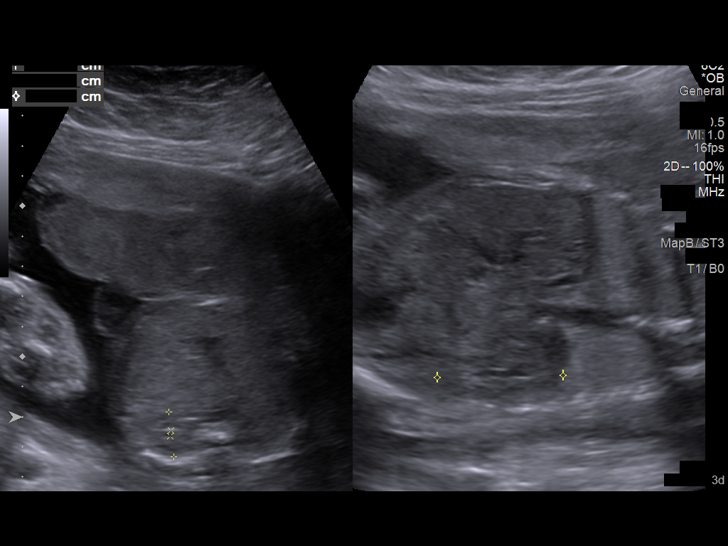
[im 58/58]
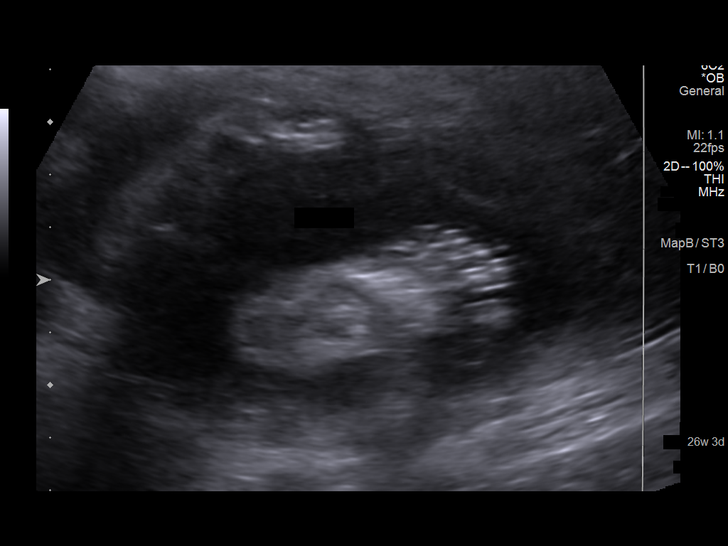

[13 of 28 positions shown; findings below may reference images not displayed]

FINDINGS: Number of Fetuses: 1

Heart Rate:  135 bpm

Movement: Yes

Presentation: Cephalic

Previa: No

Placental Location: Anterior

Amniotic Fluid (Subjective): Within normal limits

Amniotic Fluid (Objective):

Vertical pocket 4.9cm

FETAL BIOMETRY

BPD:  6.9cm 27w 5d

HC:    24.4cm 26w 3d

AC:    21.7cm 26w 1d

FL:    4.9cm 26w 3d

Current Mean GA: 26w 6d US EDC: 10/25/2021

Assigned GA: 26w 3d Assigned EDC: 10/28/2021

FETAL ANATOMY

Lateral Ventricles: Appears normal

Thalami/CSP: Appears normal

Posterior Fossa: Previously seen

Nuchal Region: Previously seen

Upper Lip: Previously seen

Spine: Appears normal

4 Chamber Heart on Left: Appears normal

LVOT: Appears normal

RVOT: Previously seen

Stomach on Left: Appears normal

3 Vessel Cord: Appears normal

Cord Insertion site: Previously seen

Kidneys: Appears normal

Bladder: Appears normal

Extremities: Previously seen

Sex: Previously Seen

Technical Limitations: Fetal position and advanced gestational age

Maternal Findings:

Cervix:  3.9 cm TA
IMPRESSION: Assigned GA currently 26 weeks 3 days.  Appropriate fetal growth.

Fetal spine and lateral cerebral ventricles are visualized on
today's exam. No fetal anomalies seen involving visualized anatomy.

Normal amniotic fluid volume.  Normal cervical length.

## 2022-03-29 IMAGING — US US OB FOLLOW-UP
1 series · 13 of 28 positions shown · non-contrast
Comparison: none

CLINICAL DATA: Assess fetal growth and AFI, maternal high BMI

EXAM:
OBSTETRIC 14+ WK ULTRASOUND FOLLOW-UP

[Series 1: us ob follow up · 13 of 58 slices shown]
[im 3/58]
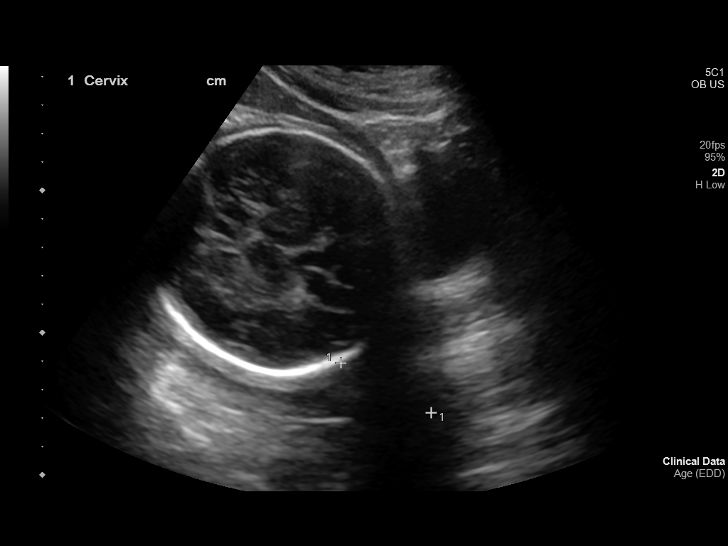
[im 7/58]
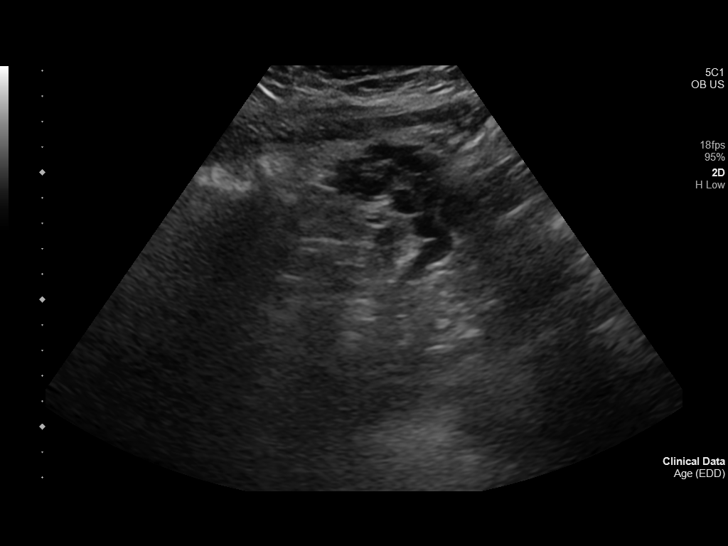
[im 11/58]
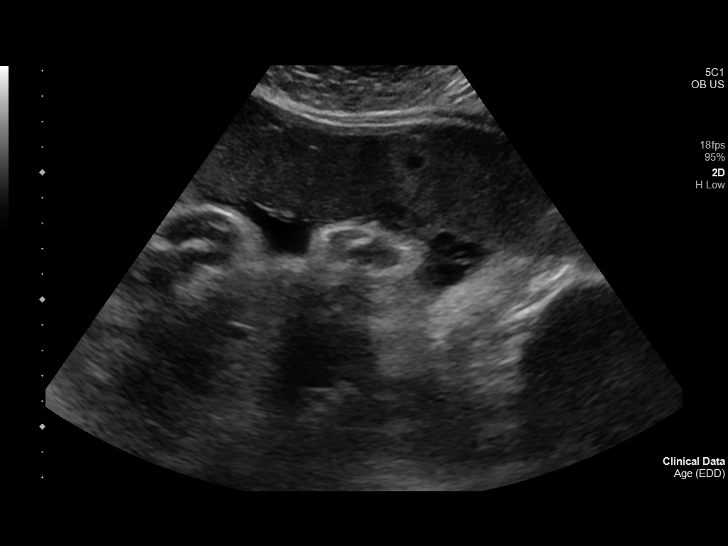
[im 15/58]
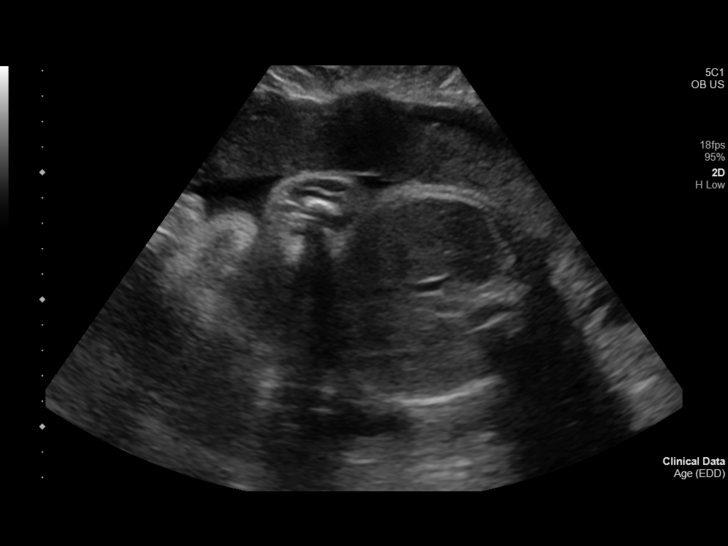
[im 20/58]
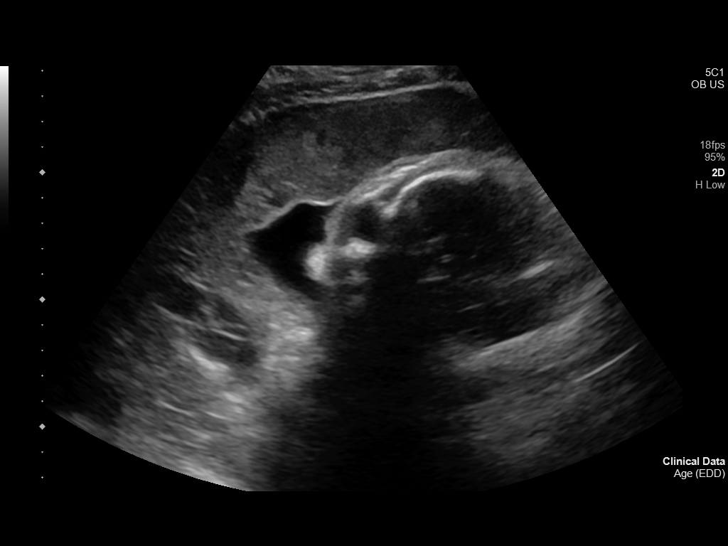
[im 24/58]
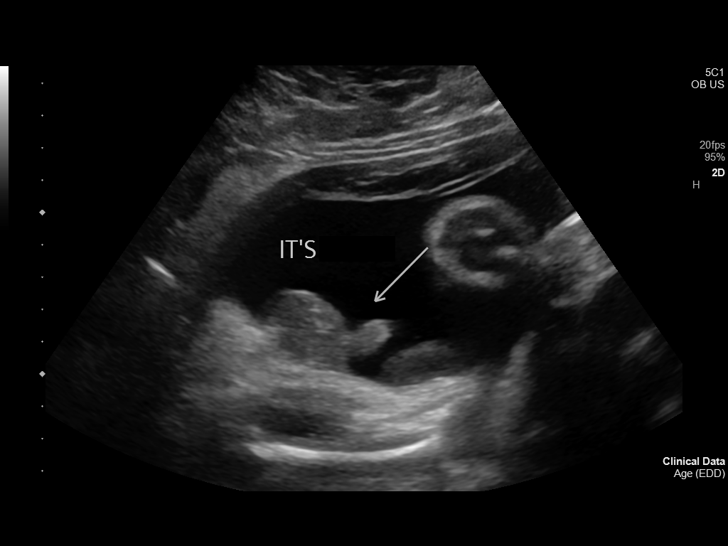
[im 30/58]
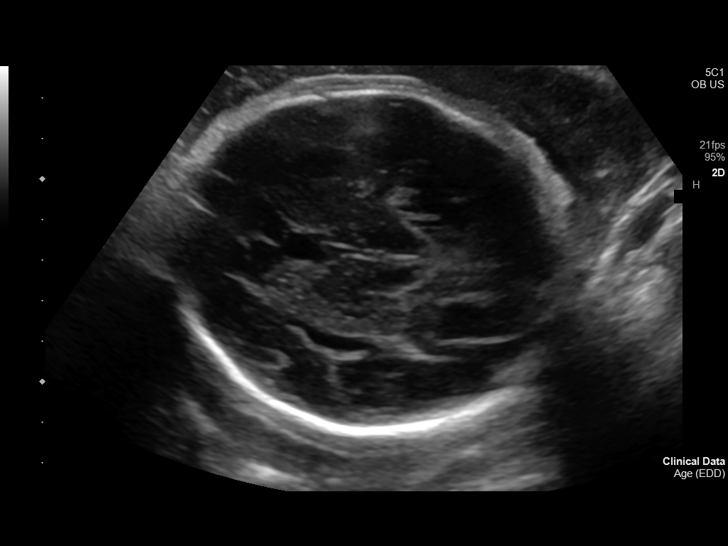
[im 34/58]
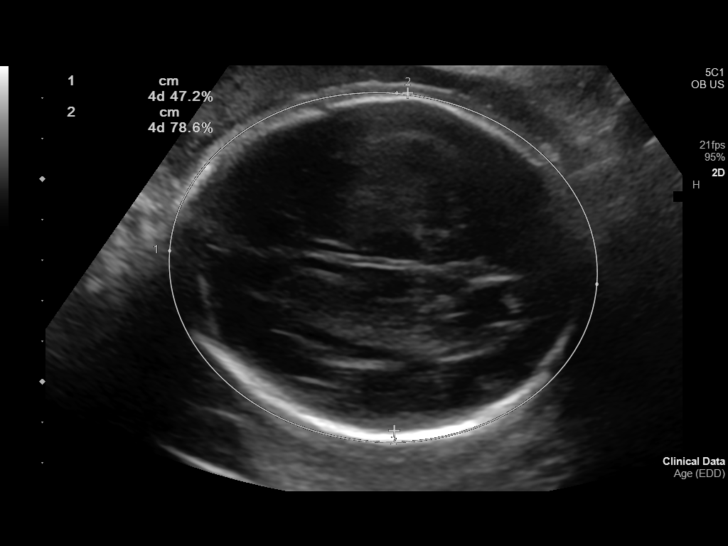
[im 39/58]
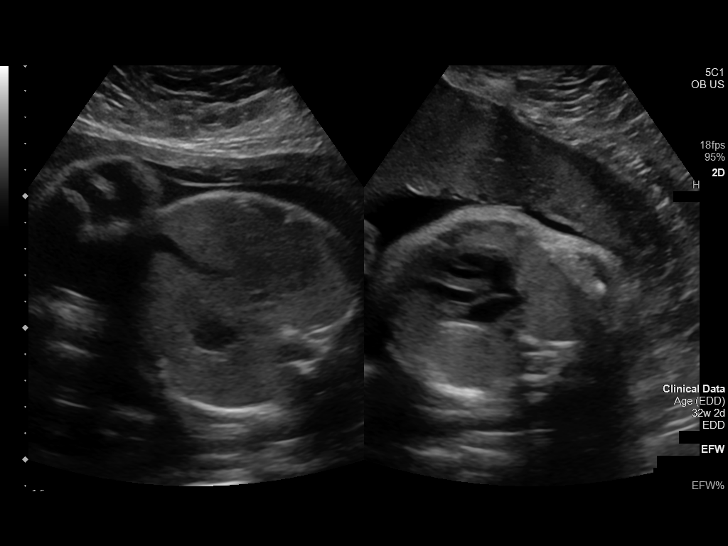
[im 43/58]
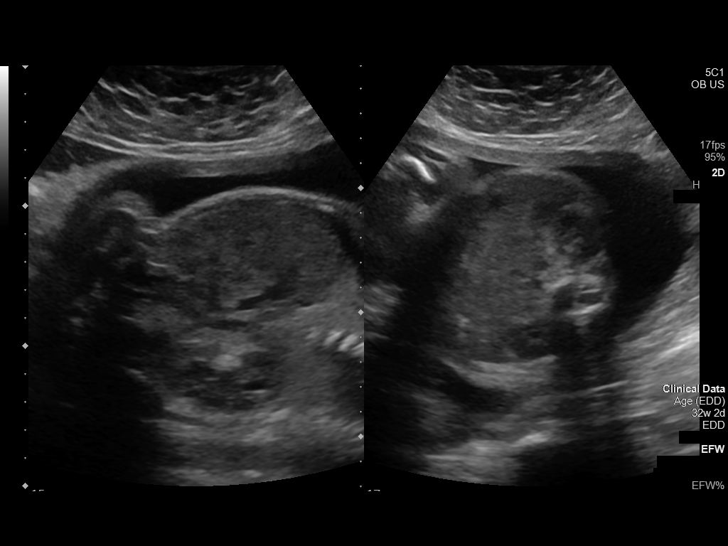
[im 47/58]
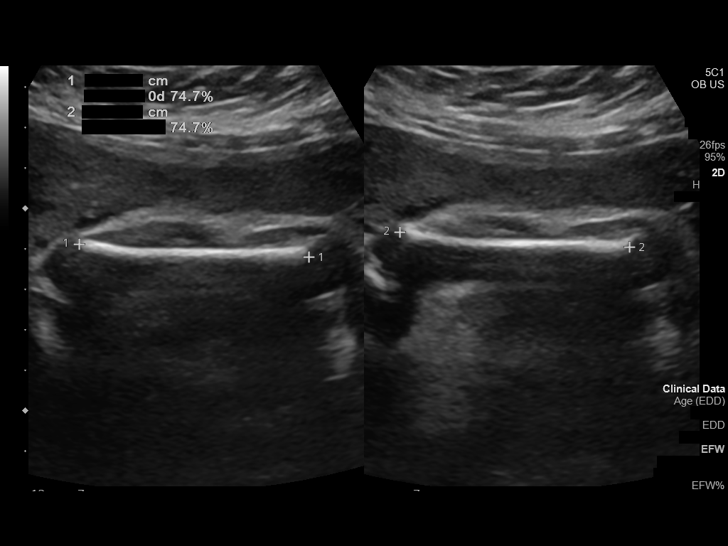
[im 51/58]
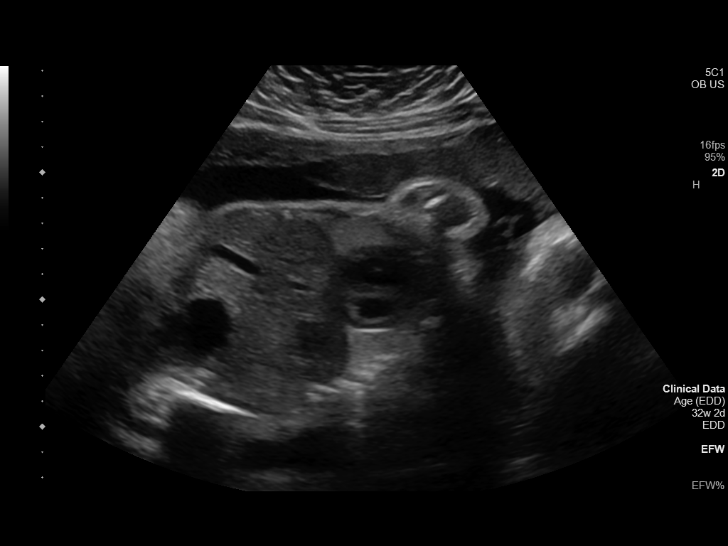
[im 55/58]
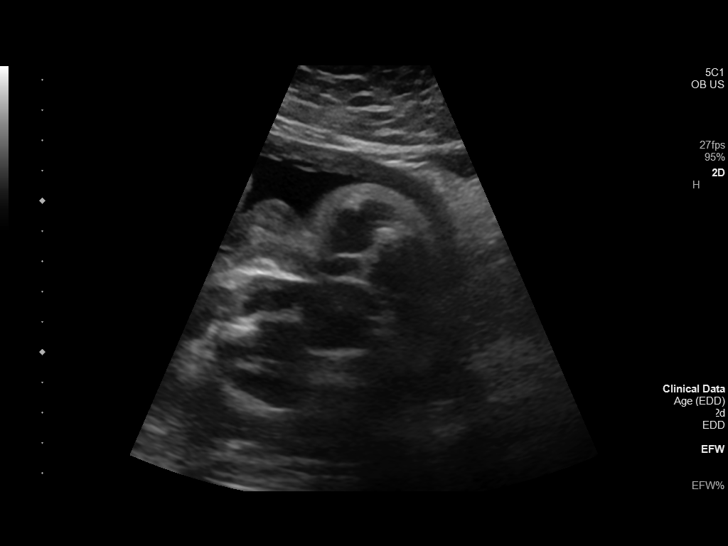

[13 of 28 positions shown; findings below may reference images not displayed]

FINDINGS: Number of Fetuses: 1

Heart Rate:  138 bpm

Movement: Yes

Presentation: Cephalic

Previa: No

Placental Location: Anterior

Amniotic Fluid (Subjective): Normal

Amniotic Fluid (Objective):

Vertical pocket 7.7cm

AFI 21.1 cm (5%ile= 8.6 cm, 95%= 24.2 cm for 32 wks)

FETAL BIOMETRY

BPD:  8.4cm 33w 4d

HC:    29.9cm 33w 1d

AC:    29.1cm 33w 1d

FL:    6.4cm 33w 0d

Current Mean GA: 33w 2d US EDC: 10/22/2021

Assigned GA: 32w 2d Assigned EDC: 10/28/2021

Estimated Fetal Weight:  2,130g 67.7%ile

FETAL ANATOMY

Lateral Ventricles: Appears normal

Thalami/CSP: Appears normal

Posterior Fossa: Previously seen

Nuchal Region: Previously seen

Upper Lip: Previously seen

Spine: Previously seen

4 Chamber Heart on Left: Appears normal

LVOT: Previously seen

RVOT: Previously seen

Stomach on Left: Appears normal

3 Vessel Cord: Appears normal

Cord Insertion site: Previously seen

Kidneys: Appears normal

Bladder: Appears normal

Extremities: Previously seen

Sex: Male

Technical Limitations: Fetal position and advanced gestational age,
maternal body habitus

Maternal Findings:

Cervix:  3.6 cm
IMPRESSION: 1. Single live intrauterine pregnancy as above, estimated age 33
weeks and 2 days.
2. Estimated fetal weight at the sixty-seventh percentile.
3. Normal amniotic fluid index.
4. Limited fetal anatomic survey as above. No fetal anomalies
detected.

## 2023-04-08 ENCOUNTER — Ambulatory Visit (INDEPENDENT_AMBULATORY_CARE_PROVIDER_SITE_OTHER): Payer: Managed Care, Other (non HMO)

## 2023-04-08 VITALS — BP 117/67 | HR 85 | Ht 64.0 in | Wt 220.5 lb

## 2023-04-08 DIAGNOSIS — O3680X9 Pregnancy with inconclusive fetal viability, other fetus: Secondary | ICD-10-CM

## 2023-04-08 DIAGNOSIS — Z3200 Encounter for pregnancy test, result unknown: Secondary | ICD-10-CM

## 2023-04-08 DIAGNOSIS — Z3201 Encounter for pregnancy test, result positive: Secondary | ICD-10-CM

## 2023-04-08 DIAGNOSIS — N912 Amenorrhea, unspecified: Secondary | ICD-10-CM

## 2023-04-08 DIAGNOSIS — Z8742 Personal history of other diseases of the female genital tract: Secondary | ICD-10-CM

## 2023-04-08 DIAGNOSIS — Z3481 Encounter for supervision of other normal pregnancy, first trimester: Secondary | ICD-10-CM

## 2023-04-08 LAB — POCT URINE PREGNANCY: Preg Test, Ur: POSITIVE — AB

## 2023-04-08 NOTE — Progress Notes (Signed)
    NURSE VISIT NOTE  Subjective:    Patient ID: Daisy Douglas, female    DOB: August 30, 1995, 27 y.o.   MRN: [id number]  HPI  Patient is a 27 y.o. G63P1001 female who presents for evaluation of amenorrhea. She believes she could be pregnant. Pregnancy is desired. Sexual Activity: single partner, contraception: none. Current symptoms also include: breast tenderness, fatigue, frequent urination, nausea, and positive home pregnancy test. Last period was abnormal, she reports a history of irregular cycles.    Objective:    BP 117/67   Pulse 85   Ht 5\' 4"  (1.626 m)   Wt 220 lb 8 oz (100 kg)   LMP 01/27/2023 (Approximate)   Breastfeeding Unknown   BMI 37.85 kg/m   Lab Review  Results for orders placed or performed in visit on 04/08/23  POCT urine pregnancy  Result Value Ref Range   Preg Test, Ur Positive (A) Negative    Assessment:   1. Possible pregnancy, not yet confirmed   2. Encounter for supervision of other normal pregnancy in first trimester   3. History of irregular menstrual cycles   4. Encounter to determine fetal viability of pregnancy, other fetus     Plan:   Pregnancy Test: Positive  Estimated Date of Delivery: 11/03/22. Encouraged well-balanced diet, plenty of rest when needed, pre-natal vitamins daily and walking for exercise.  Discussed self-help for nausea, avoiding OTC medications until consulting provider or pharmacist, other than Tylenol as needed, minimal caffeine (1-2 cups daily) and avoiding alcohol.   She will schedule her nurse visit @ 7-[redacted] wks pregnant, u/s for dating @10  wk, and NOB visit at [redacted] wk pregnant.   Ultrasound ordered due to irregular cycles.Feel free to call with any questions.   Loman Chroman, CMA

## 2023-04-10 ENCOUNTER — Ambulatory Visit (INDEPENDENT_AMBULATORY_CARE_PROVIDER_SITE_OTHER): Payer: Managed Care, Other (non HMO)

## 2023-04-10 DIAGNOSIS — Z3480 Encounter for supervision of other normal pregnancy, unspecified trimester: Secondary | ICD-10-CM | POA: Insufficient documentation

## 2023-04-10 DIAGNOSIS — Z3689 Encounter for other specified antenatal screening: Secondary | ICD-10-CM

## 2023-04-10 NOTE — Patient Instructions (Signed)
Second Trimester of Pregnancy  The second trimester of pregnancy is from week 13 through week 27. This is months 4 through 6 of pregnancy. The second trimester is often a time when you feel your best. Your body has adjusted to being pregnant, and you begin to feel better physically. During the second trimester: Morning sickness has lessened or stopped completely. You may have more energy. You may have an increase in appetite. The second trimester is also a time when the unborn baby (fetus) is growing rapidly. At the end of the sixth month, the fetus may be up to 12 inches long and weigh about 1 pounds. You will likely begin to feel the baby move (quickening) between 16 and 20 weeks of pregnancy. Body changes during your second trimester Your body continues to go through many changes during your second trimester. The changes vary and generally return to normal after the baby is born. Physical changes Your weight will continue to increase. You will notice your lower abdomen bulging out. You may begin to get stretch marks on your hips, abdomen, and breasts. Your breasts will continue to grow and to become tender. Dark spots or blotches (chloasma or mask of pregnancy) may develop on your face. A dark line from your belly button to the pubic area (linea nigra) may appear. You may have changes in your hair. These can include thickening of your hair, rapid growth, and changes in texture. Some people also have hair loss during or after pregnancy, or hair that feels dry or thin. Health changes You may develop headaches. You may have heartburn. You may develop constipation. You may develop hemorrhoids or swollen, bulging veins (varicose veins). Your gums may bleed and may be sensitive to brushing and flossing. You may urinate more often because the fetus is pressing on your bladder. You may have back pain. This is caused by: Weight gain. Pregnancy hormones that are relaxing the joints in your  pelvis. A shift in weight and the muscles that support your balance. Follow these instructions at home: Medicines Follow your health care provider's instructions regarding medicine use. Specific medicines may be either safe or unsafe to take during pregnancy. Do not take any medicines unless approved by your health care provider. Take a prenatal vitamin that contains at least 600 micrograms (mcg) of folic acid. Eating and drinking Eat a healthy diet that includes fresh fruits and vegetables, whole grains, good sources of protein such as meat, eggs, or tofu, and low-fat dairy products. Avoid raw meat and unpasteurized juice, milk, and cheese. These carry germs that can harm you and your baby. You may need to take these actions to prevent or treat constipation: Drink enough fluid to keep your urine pale yellow. Eat foods that are high in fiber, such as beans, whole grains, and fresh fruits and vegetables. Limit foods that are high in fat and processed sugars, such as fried or sweet foods. Activity Exercise only as directed by your health care provider. Most people can continue their usual exercise routine during pregnancy. Try to exercise for 30 minutes at least 5 days a week. Stop exercising if you develop contractions in your uterus. Stop exercising if you develop pain or cramping in the lower abdomen or lower back. Avoid exercising if it is very hot or humid or if you are at a high altitude. Avoid heavy lifting. If you choose to, you may have sex unless your health care provider tells you not to. Relieving pain and discomfort Wear a supportive   bra to prevent discomfort from breast tenderness. Take warm sitz baths to soothe any pain or discomfort caused by hemorrhoids. Use hemorrhoid cream if your health care provider approves. Rest with your legs raised (elevated) if you have leg cramps or low back pain. If you develop varicose veins: Wear support hose as told by your health care  provider. Elevate your feet for 15 minutes, 3-4 times a day. Limit salt in your diet. Safety Wear your seat belt at all times when driving or riding in a car. Talk with your health care provider if someone is verbally or physically abusive to you. Lifestyle Do not use hot tubs, steam rooms, or saunas. Do not douche. Do not use tampons or scented sanitary pads. Avoid cat litter boxes and soil used by cats. These carry germs that can cause birth defects in the baby and possibly loss of the fetus by miscarriage or stillbirth. Do not use herbal remedies, alcohol, illegal drugs, or medicines that are not approved by your health care provider. Chemicals in these products can harm your baby. Do not use any products that contain nicotine or tobacco, such as cigarettes, e-cigarettes, and chewing tobacco. If you need help quitting, ask your health care provider. General instructions During a routine prenatal visit, your health care provider will do a physical exam and other tests. He or she will also discuss your overall health. Keep all follow-up visits. This is important. Ask your health care provider for a referral to a local prenatal education class. Ask for help if you have counseling or nutritional needs during pregnancy. Your health care provider can offer advice or refer you to specialists for help with various needs. Where to find more information American Pregnancy Association: americanpregnancy.org American College of Obstetricians and Gynecologists: acog.org/en/Womens%20Health/Pregnancy Office on Women's Health: womenshealth.gov/pregnancy Contact a health care provider if you have: A headache that does not go away when you take medicine. Vision changes or you see spots in front of your eyes. Mild pelvic cramps, pelvic pressure, or nagging pain in the abdominal area. Persistent nausea, vomiting, or diarrhea. A bad-smelling vaginal discharge or foul-smelling urine. Pain when you  urinate. Sudden or extreme swelling of your face, hands, ankles, feet, or legs. A fever. Get help right away if you: Have fluid leaking from your vagina. Have spotting or bleeding from your vagina. Have severe abdominal cramping or pain. Have difficulty breathing. Have chest pain. Have fainting spells. Have not felt your baby move for the time period told by your health care provider. Have new or increased pain, swelling, or redness in an arm or leg. Summary The second trimester of pregnancy is from week 13 through week 27 (months 4 through 6). Do not use herbal remedies, alcohol, illegal drugs, or medicines that are not approved by your health care provider. Chemicals in these products can harm your baby. Exercise only as directed by your health care provider. Most people can continue their usual exercise routine during pregnancy. Keep all follow-up visits. This is important. This information is not intended to replace advice given to you by your health care provider. Make sure you discuss any questions you have with your health care provider. Document Revised: 01/13/2020 Document Reviewed: 11/19/2019 Elsevier Patient Education  2024 [employer] First Trimester of Pregnancy  The first trimester of pregnancy starts on the first day of your last menstrual period until the end of week 12. This is also called months 1 through 3 of pregnancy. Body changes during your first trimester Your   body goes through many changes during pregnancy. The changes usually return to normal after your baby is born. Physical changes You may gain or lose weight. Your breasts may grow larger and hurt. The area around your nipples may get darker. Dark spots or blotches may develop on your face. You may have changes in your hair. Health changes You may feel like you might vomit (nauseous), and you may vomit. You may have heartburn. You may have headaches. You may have trouble pooping (constipation). Your  gums may bleed. Other changes You may get tired easily. You may pee (urinate) more often. Your menstrual periods will stop. You may not feel hungry. You may want to eat certain kinds of food. You may have changes in your emotions from day to day. You may have more dreams. Follow these instructions at home: Medicines Take over-the-counter and prescription medicines only as told by your doctor. Some medicines are not safe during pregnancy. Take a prenatal vitamin that contains at least 600 micrograms (mcg) of folic acid. Eating and drinking Eat healthy meals that include: Fresh fruits and vegetables. Whole grains. Good sources of protein, such as meat, eggs, or tofu. Low-fat dairy products. Avoid raw meat and unpasteurized juice, milk, and cheese. If you feel like you may vomit, or you vomit: Eat 4 or 5 small meals a day instead of 3 large meals. Try eating a few soda crackers. Drink liquids between meals instead of during meals. You may need to take these actions to prevent or treat trouble pooping: Drink enough fluids to keep your pee (urine) pale yellow. Eat foods that are high in fiber. These include beans, whole grains, and fresh fruits and vegetables. Limit foods that are high in fat and sugar. These include fried or sweet foods. Activity Exercise only as told by your doctor. Most people can do their usual exercise routine during pregnancy. Stop exercising if you have cramps or pain in your lower belly (abdomen) or low back. Do not exercise if it is too hot or too humid, or if you are in a place of great height (high altitude). Avoid heavy lifting. If you choose to, you may have sex unless your doctor tells you not to. Relieving pain and discomfort Wear a good support bra if your breasts are sore. Rest with your legs raised (elevated) if you have leg cramps or low back pain. If you have bulging veins (varicose veins) in your legs: Wear support hose as told by your  doctor. Raise your feet for 15 minutes, 3-4 times a day. Limit salt in your food. Safety Wear your seat belt at all times when you are in a car. Talk with your doctor if someone is hurting you or yelling at you. Talk with your doctor if you are feeling sad or have thoughts of hurting yourself. Lifestyle Do not use hot tubs, steam rooms, or saunas. Do not douche. Do not use tampons or scented sanitary pads. Do not use herbal medicines, illegal drugs, or medicines that are not approved by your doctor. Do not drink alcohol. Do not smoke or use any products that contain nicotine or tobacco. If you need help quitting, ask your doctor. Avoid cat litter boxes and soil that is used by cats. These carry germs that can cause harm to the baby and can cause a loss of your baby by miscarriage or stillbirth. General instructions Keep all follow-up visits. This is important. Ask for help if you need counseling or if you need help with   nutrition. Your doctor can give you advice or tell you where to go for help. Visit your dentist. At home, brush your teeth with a soft toothbrush. Floss gently. Write down your questions. Take them to your prenatal visits. Where to find more information American Pregnancy Association: americanpregnancy.org American College of Obstetricians and Gynecologists: www.acog.org Office on Women's Health: womenshealth.gov/pregnancy Contact a doctor if: You are dizzy. You have a fever. You have mild cramps or pressure in your lower belly. You have a nagging pain in your belly area. You continue to feel like you may vomit, you vomit, or you have watery poop (diarrhea) for 24 hours or longer. You have a bad-smelling fluid coming from your vagina. You have pain when you pee. You are exposed to a disease that spreads from person to person, such as chickenpox, measles, Zika virus, HIV, or hepatitis. Get help right away if: You have spotting or bleeding from your vagina. You have  very bad belly cramping or pain. You have shortness of breath or chest pain. You have any kind of injury, such as from a fall or a car crash. You have new or increased pain, swelling, or redness in an arm or leg. Summary The first trimester of pregnancy starts on the first day of your last menstrual period until the end of week 12 (months 1 through 3). Eat 4 or 5 small meals a day instead of 3 large meals. Do not smoke or use any products that contain nicotine or tobacco. If you need help quitting, ask your doctor. Keep all follow-up visits. This information is not intended to replace advice given to you by your health care provider. Make sure you discuss any questions you have with your health care provider. Document Revised: 01/13/2020 Document Reviewed: 11/19/2019 Elsevier Patient Education  2024 Elsevier Inc. Commonly Asked Questions During Pregnancy  Cats: A parasite can be excreted in cat feces.  To avoid exposure you need to have another person empty the little box.  If you must empty the litter box you will need to wear gloves.  Wash your hands after handling your cat.  This parasite can also be found in raw or undercooked meat so this should also be avoided.  Colds, Sore Throats, Flu: Please check your medication sheet to see what you can take for symptoms.  If your symptoms are unrelieved by these medications please call the office.  Dental Work: Most any dental work your dentist recommends is permitted.  X-rays should only be taken during the first trimester if absolutely necessary.  Your abdomen should be shielded with a lead apron during all x-rays.  Please notify your provider prior to receiving any x-rays.  Novocaine is fine; gas is not recommended.  If your dentist requires a note from us prior to dental work please call the office and we will provide one for you.  Exercise: Exercise is an important part of staying healthy during your pregnancy.  You may continue most exercises  you were accustomed to prior to pregnancy.  Later in your pregnancy you will most likely notice you have difficulty with activities requiring balance like riding a bicycle.  It is important that you listen to your body and avoid activities that put you at a higher risk of falling.  Adequate rest and staying well hydrated are a must!  If you have questions about the safety of specific activities ask your provider.    Exposure to Children with illness: Try to avoid obvious exposure; report any   symptoms to us when noted,  If you have chicken pos, red measles or mumps, you should be immune to these diseases.   Please do not take any vaccines while pregnant unless you have checked with your OB provider.  Fetal Movement: After 28 weeks we recommend you do "kick counts" twice daily.  Lie or sit down in a calm quiet environment and count your baby movements "kicks".  You should feel your baby at least 10 times per hour.  If you have not felt 10 kicks within the first hour get up, walk around and have something sweet to eat or drink then repeat for an additional hour.  If count remains less than 10 per hour notify your provider.  Fumigating: Follow your pest control agent's advice as to how long to stay out of your home.  Ventilate the area well before re-entering.  Hemorrhoids:   Most over-the-counter preparations can be used during pregnancy.  Check your medication to see what is safe to use.  It is important to use a stool softener or fiber in your diet and to drink lots of liquids.  If hemorrhoids seem to be getting worse please call the office.   Hot Tubs:  Hot tubs Jacuzzis and saunas are not recommended while pregnant.  These increase your internal body temperature and should be avoided.  Intercourse:  Sexual intercourse is safe during pregnancy as long as you are comfortable, unless otherwise advised by your provider.  Spotting may occur after intercourse; report any bright red bleeding that is heavier  than spotting.  Labor:  If you know that you are in labor, please go to the hospital.  If you are unsure, please call the office and let us help you decide what to do.  Lifting, straining, etc:  If your job requires heavy lifting or straining please check with your provider for any limitations.  Generally, you should not lift items heavier than that you can lift simply with your hands and arms (no back muscles)  Painting:  Paint fumes do not harm your pregnancy, but may make you ill and should be avoided if possible.  Latex or water based paints have less odor than oils.  Use adequate ventilation while painting.  Permanents & Hair Color:  Chemicals in hair dyes are not recommended as they cause increase hair dryness which can increase hair loss during pregnancy.  " Highlighting" and permanents are allowed.  Dye may be absorbed differently and permanents may not hold as well during pregnancy.  Sunbathing:  Use a sunscreen, as skin burns easily during pregnancy.  Drink plenty of fluids; avoid over heating.  Tanning Beds:  Because their possible side effects are still unknown, tanning beds are not recommended.  Ultrasound Scans:  Routine ultrasounds are performed at approximately 20 weeks.  You will be able to see your baby's general anatomy an if you would like to know the gender this can usually be determined as well.  If it is questionable when you conceived you may also receive an ultrasound early in your pregnancy for dating purposes.  Otherwise ultrasound exams are not routinely performed unless there is a medical necessity.  Although you can request a scan we ask that you pay for it when conducted because insurance does not cover " patient request" scans.  Work: If your pregnancy proceeds without complications you may work until your due date, unless your physician or employer advises otherwise.  Round Ligament Pain/Pelvic Discomfort:  Sharp, shooting pains not associated   with bleeding are  fairly common, usually occurring in the second trimester of pregnancy.  They tend to be worse when standing up or when you remain standing for long periods of time.  These are the result of pressure of certain pelvic ligaments called "round ligaments".  Rest, Tylenol and heat seem to be the most effective relief.  As the womb and fetus grow, they rise out of the pelvis and the discomfort improves.  Please notify the office if your pain seems different than that described.  It may represent a more serious condition.  Common Medications Safe in Pregnancy  Acne:      Constipation:  Benzoyl Peroxide     Colace  Clindamycin      Dulcolax Suppository  Topica Erythromycin     Fibercon  Salicylic Acid      Metamucil         Miralax AVOID:        Senakot   Accutane    Cough:  Retin-A       Cough Drops  Tetracycline      Phenergan w/ Codeine if Rx  Minocycline      Robitussin (Plain & DM)  Antibiotics:     Crabs/Lice:  Ceclor       RID  Cephalosporins    AVOID:  E-Mycins      Kwell  Keflex  Macrobid/Macrodantin   Diarrhea:  Penicillin      Kao-Pectate  Zithromax      Imodium AD         PUSH FLUIDS AVOID:       Cipro     Fever:  Tetracycline      Tylenol (Regular or Extra  Minocycline       Strength)  Levaquin      Extra Strength-Do not          Exceed 8 tabs/24 hrs Caffeine:        <200mg/day (equiv. To 1 cup of coffee or  approx. 3 12 oz sodas)         Gas: Cold/Hayfever:       Gas-X  Benadryl      Mylicon  Claritin       Phazyme  **Claritin-D        Chlor-Trimeton    Headaches:  Dimetapp      ASA-Free Excedrin  Drixoral-Non-Drowsy     Cold Compress  Mucinex (Guaifenasin)     Tylenol (Regular or Extra  Sudafed/Sudafed-12 Hour     Strength)  **Sudafed PE Pseudoephedrine   Tylenol Cold & Sinus     Vicks Vapor Rub  Zyrtec  **AVOID if Problems With Blood Pressure         Heartburn: Avoid lying down for at least 1 hour after meals  Aciphex      Maalox     Rash:  Milk of  Magnesia     Benadryl    Mylanta       1% Hydrocortisone Cream  Pepcid  Pepcid Complete   Sleep Aids:  Prevacid      Ambien   Prilosec       Benadryl  Rolaids       Chamomile Tea  Tums (Limit 4/day)     Unisom         Tylenol PM         Warm milk-add vanilla or  Hemorrhoids:       Sugar for taste  Anusol/Anusol H.C.  (RX: Analapram 2.5%)  Sugar Substitutes:    Hydrocortisone OTC     Ok in moderation  Preparation H      Tucks        Vaseline lotion applied to tissue with wiping    Herpes:     Throat:  Acyclovir      Oragel  Famvir  Valtrex     Vaccines:         Flu Shot Leg Cramps:       *Gardasil  Benadryl      Hepatitis A         Hepatitis B Nasal Spray:       Pneumovax  Saline Nasal Spray     Polio Booster         Tetanus Nausea:       Tuberculosis test or PPD  Vitamin B6 25 mg TID   AVOID:    Dramamine      *Gardasil  Emetrol       Live Poliovirus  Ginger Root 250 mg QID    MMR (measles, mumps &  High Complex Carbs @ Bedtime    rebella)  Sea Bands-Accupressure    Varicella (Chickenpox)  Unisom 1/2 tab TID     *No known complications           If received before Pain:         Known pregnancy;   Darvocet       Resume series after  Lortab        Delivery  Percocet    Yeast:   Tramadol      Femstat  Tylenol 3      Gyne-lotrimin  Ultram       Monistat  Vicodin           MISC:         All Sunscreens           Hair Coloring/highlights          Insect Repellant's          (Including DEET)         Mystic Tans  

## 2023-04-10 NOTE — Progress Notes (Signed)
New OB Intake  I connected with  Daisy Douglas on 04/10/23 at  1:15 PM EDT by telephone Video Visit and verified that I am speaking with the correct person using two identifiers. Nurse is located at [facility] and pt is located in car.  I explained I am completing New OB Intake today. We discussed her EDD of 11/03/2023 that is based on LMP of approx 01/27/2023. Pt is G2/P1001. I reviewed her allergies, medications, Medical/Surgical/OB history, and appropriate screenings. There are no cats in the home.  Based on history, this is a/an pregnancy uncomplicated .   Patient Active Problem List   Diagnosis Date Noted   Supervision of other normal pregnancy, antepartum 04/10/2023   Uterine contractions 10/19/2021   Pregnancy 10/02/2021   Pregnancy related bilateral lower abdominal pain, antepartum 08/22/2021   Anxiety 01/20/2019   Vitamin D deficiency 01/20/2019    Concerns addressed today None  Delivery Plans:  Plans to deliver at [facility]. Aware that is the only hosp we deliver at.  Anatomy US Explained first scheduled [place] will be Aug 23rd and an anatomy scan will be done at 20 weeks.  Labs Discussed genetic screening with patient. Patient desires genetic testing to be drawn at new OB visit. Discussed possible labs to be drawn at new OB appointment.  COVID Vaccine Patient has had COVID vaccine.   Social Determinants of Health Food Insecurity: expresses food insecurity. Information given on local food banks. Transportation: Patient expressed transportation needs. Childcare: Discussed no children allowed at ultrasound appointments.   First visit review I reviewed new OB appt with pt. I explained she will have ob bloodwork and pap smear/pelvic exam if indicated. Explained pt will be seen by an AOB provider at first visit; encounter routed to appropriate provider.   Loran Senters, [place] 04/10/2023  1:46 PM

## 2023-04-12 ENCOUNTER — Ambulatory Visit
Admission: RE | Admit: 2023-04-12 | Discharge: 2023-04-12 | Disposition: A | Discharge status: Home / Self Care | Payer: Managed Care, Other (non HMO) | Source: Ambulatory Visit | Attending: Licensed Practical Nurse | Admitting: Licensed Practical Nurse

## 2023-04-12 DIAGNOSIS — O3680X9 Pregnancy with inconclusive fetal viability, other fetus: Secondary | ICD-10-CM | POA: Insufficient documentation

## 2023-04-12 DIAGNOSIS — Z3481 Encounter for supervision of other normal pregnancy, first trimester: Secondary | ICD-10-CM | POA: Insufficient documentation

## 2023-04-12 DIAGNOSIS — Z8742 Personal history of other diseases of the female genital tract: Secondary | ICD-10-CM | POA: Insufficient documentation

## 2023-04-12 DIAGNOSIS — Z3A01 Less than 8 weeks gestation of pregnancy: Secondary | ICD-10-CM | POA: Insufficient documentation

## 2023-04-12 DIAGNOSIS — O208 Other hemorrhage in early pregnancy: Secondary | ICD-10-CM | POA: Diagnosis not present

## 2023-04-20 ENCOUNTER — Emergency Department: Payer: Managed Care, Other (non HMO)

## 2023-04-20 ENCOUNTER — Emergency Department
Admission: EM | Admit: 2023-04-20 | Discharge: 2023-04-20 | Disposition: A | Discharge status: Home / Self Care | Payer: Managed Care, Other (non HMO) | Attending: Emergency Medicine | Admitting: Emergency Medicine

## 2023-04-20 ENCOUNTER — Other Ambulatory Visit: Payer: Self-pay

## 2023-04-20 DIAGNOSIS — Z1152 Encounter for screening for COVID-19: Secondary | ICD-10-CM | POA: Insufficient documentation

## 2023-04-20 DIAGNOSIS — O99511 Diseases of the respiratory system complicating pregnancy, first trimester: Secondary | ICD-10-CM | POA: Diagnosis present

## 2023-04-20 DIAGNOSIS — O26891 Other specified pregnancy related conditions, first trimester: Secondary | ICD-10-CM | POA: Insufficient documentation

## 2023-04-20 DIAGNOSIS — Z3A01 Less than 8 weeks gestation of pregnancy: Secondary | ICD-10-CM | POA: Insufficient documentation

## 2023-04-20 DIAGNOSIS — J029 Acute pharyngitis, unspecified: Secondary | ICD-10-CM

## 2023-04-20 LAB — RESP PANEL BY RT-PCR (RSV, FLU A&B, COVID)  RVPGX2
Influenza A by PCR: NEGATIVE
Influenza B by PCR: NEGATIVE
Resp Syncytial Virus by PCR: NEGATIVE
SARS Coronavirus 2 by RT PCR: NEGATIVE

## 2023-04-20 LAB — GROUP A STREP BY PCR: Group A Strep by PCR: DETECTED — AB

## 2023-04-20 MED ORDER — AMOXICILLIN 500 MG PO CAPS
500.0000 mg | ORAL_CAPSULE | Freq: Once | ORAL | Status: AC
  Administered 2023-04-20: 500 mg via ORAL
  Filled 2023-04-20: qty 1

## 2023-04-20 MED ORDER — AMOXICILLIN 500 MG PO CAPS
500.0000 mg | ORAL_CAPSULE | Freq: Two times a day (BID) | ORAL | 0 refills | Status: AC
Start: 2023-04-20 — End: 2023-04-30

## 2023-04-20 NOTE — ED Provider Notes (Signed)
[facility] Provider Note  Patient Contact: 8:53 PM (approximate)   History   Cough   HPI  Daisy Douglas is a 27 y.o. female presents to the emergency department with pharyngitis that started yesterday.  Patient has also had a low-grade fever at home.  No vaginal bleeding or abdominal cramping.  Patient is approximately [redacted] weeks pregnant no chest pain, chest tightness or shortness of breath.  No cough.     Physical Exam   Triage Vital Signs: ED Triage Vitals [04/20/23 1759]  Encounter Vitals Group     BP 124/73     Systolic BP Percentile      Diastolic BP Percentile      Pulse Rate 92     Resp 18     Temp 99.1 F (37.3 C)     Temp Source Oral     SpO2 100 %     Weight      Height      Head Circumference      Peak Flow      Pain Score 4     Pain Loc      Pain Education      Exclude from Growth Chart     Most recent vital signs: Vitals:   04/20/23 1759  BP: 124/73  Pulse: 92  Resp: 18  Temp: 99.1 F (37.3 C)  SpO2: 100%     General: Alert and in no acute distress. Eyes:  PERRL. EOMI. Head: No acute traumatic findings ENT:      Nose: No congestion/rhinnorhea.      Mouth/Throat: Mucous membranes are moist.  Posterior pharynx is erythematous. Neck: No stridor. No cervical spine tenderness to palpation. Cardiovascular:  Good peripheral perfusion Respiratory: Normal respiratory effort without tachypnea or retractions. Lungs CTAB. Good air entry to the bases with no decreased or absent breath sounds. Gastrointestinal: Bowel sounds 4 quadrants. Soft and nontender to palpation. No guarding or rigidity. No palpable masses. No distention. No CVA tenderness. Musculoskeletal: Full range of motion to all extremities.  Neurologic:  No gross focal neurologic deficits are appreciated.  Skin:   No rash noted    ED Results / Procedures / Treatments   Labs (all labs ordered are listed, but only abnormal results are displayed) Labs Reviewed   GROUP A STREP BY PCR - Abnormal; Notable for the following components:      Result Value   Group A Strep by PCR DETECTED (*)    All other components within normal limits  RESP PANEL BY RT-PCR (RSV, FLU A&B, COVID)  [id number]       PROCEDURES:  Critical Care performed: No  Procedures   MEDICATIONS ORDERED IN ED: Medications  amoxicillin (AMOXIL) capsule 500 mg (has no administration in time range)     IMPRESSION / MDM / ASSESSMENT AND PLAN / ED COURSE  I reviewed the triage vital signs and the nursing notes.                              Assessment and plan Pharyngitis 27 year old female presents to the emergency department with pharyngitis that started yesterday.  Vital signs are reassuring at triage.  On exam, patient was alert, active and nontoxic-appearing.  Group A strep testing was positive.  Patient was given her first dose of amoxicillin while in the emergency department.  She was discharged with amoxicillin.  Return precautions were given to return with new or worsening  symptoms.  All patient questions were answered.       FINAL CLINICAL IMPRESSION(S) / ED DIAGNOSES   Final diagnoses:  Pharyngitis, unspecified etiology     Rx / DC Orders   ED Discharge Orders          Ordered    amoxicillin (AMOXIL) 500 MG capsule  2 times daily        04/20/23 2050             Note:  This document was prepared using Dragon voice recognition software and may include unintentional dictation errors.   Pia Mau Peck, Cordelia Poche 04/20/23 2056    Sharman Cheek, MD 04/23/23 [phone]

## 2023-04-20 NOTE — Discharge Instructions (Addendum)
Take Amoxicillin twice daily for ten days.  

## 2023-04-20 NOTE — ED Triage Notes (Signed)
Pt to ED via POV from home. Pt reports yesterday started coughing and has progressed to sore throat. Pt reports approximately [redacted]wks pregnant. Pt is seen [facility].

## 2023-05-06 ENCOUNTER — Other Ambulatory Visit: Payer: Self-pay

## 2023-05-06 ENCOUNTER — Encounter: Payer: Self-pay | Admitting: Emergency Medicine

## 2023-05-06 ENCOUNTER — Ambulatory Visit
Admission: EM | Admit: 2023-05-06 | Discharge: 2023-05-06 | Disposition: A | Discharge status: Home / Self Care | Payer: Managed Care, Other (non HMO)

## 2023-05-06 DIAGNOSIS — O26892 Other specified pregnancy related conditions, second trimester: Secondary | ICD-10-CM | POA: Diagnosis not present

## 2023-05-06 DIAGNOSIS — Z3A14 14 weeks gestation of pregnancy: Secondary | ICD-10-CM | POA: Insufficient documentation

## 2023-05-06 DIAGNOSIS — Z1152 Encounter for screening for COVID-19: Secondary | ICD-10-CM | POA: Diagnosis not present

## 2023-05-06 DIAGNOSIS — O99820 Streptococcus B carrier state complicating pregnancy: Secondary | ICD-10-CM | POA: Diagnosis present

## 2023-05-06 LAB — CBC
HCT: 31.8 % — ABNORMAL LOW (ref 36.0–46.0)
Hemoglobin: 10.4 g/dL — ABNORMAL LOW (ref 12.0–15.0)
MCH: 24.2 pg — ABNORMAL LOW (ref 26.0–34.0)
MCHC: 32.7 g/dL (ref 30.0–36.0)
MCV: 74 fL — ABNORMAL LOW (ref 80.0–100.0)
Platelets: 219 10*3/uL (ref 150–400)
RBC: 4.3 MIL/uL (ref 3.87–5.11)
RDW: 16.2 % — ABNORMAL HIGH (ref 11.5–15.5)
WBC: 10.5 10*3/uL (ref 4.0–10.5)
nRBC: 0 % (ref 0.0–0.2)

## 2023-05-06 LAB — COMPREHENSIVE METABOLIC PANEL
ALT: 13 U/L (ref 0–44)
AST: 14 U/L — ABNORMAL LOW (ref 15–41)
Albumin: 3.6 g/dL (ref 3.5–5.0)
Alkaline Phosphatase: 77 U/L (ref 38–126)
Anion gap: 13 (ref 5–15)
BUN: 8 mg/dL (ref 6–20)
CO2: 23 mmol/L (ref 22–32)
Calcium: 9.3 mg/dL (ref 8.9–10.3)
Chloride: 101 mmol/L (ref 98–111)
Creatinine, Ser: 0.57 mg/dL (ref 0.44–1.00)
GFR, Estimated: >60 mL/min (ref >60)
Glucose, Bld: 127 mg/dL — ABNORMAL HIGH (ref 70–99)
Potassium: 3.5 mmol/L (ref 3.5–5.1)
Sodium: 137 mmol/L (ref 135–145)
Total Bilirubin: 0.6 mg/dL (ref 0.3–1.2)
Total Protein: 7.7 g/dL (ref 6.5–8.1)

## 2023-05-06 LAB — URINALYSIS, ROUTINE W REFLEX MICROSCOPIC
Bacteria, UA: NONE SEEN
Bilirubin Urine: NEGATIVE
Glucose, UA: NEGATIVE mg/dL
Hgb urine dipstick: NEGATIVE
Ketones, ur: NEGATIVE mg/dL
Nitrite: NEGATIVE
Protein, ur: NEGATIVE mg/dL
Specific Gravity, Urine: 1.025 (ref 1.005–1.030)
pH: 5 (ref 5.0–8.0)

## 2023-05-06 LAB — GROUP A STREP BY PCR: Group A Strep by PCR: DETECTED — AB

## 2023-05-06 LAB — LIPASE, BLOOD: Lipase: 27 U/L (ref 11–51)

## 2023-05-06 NOTE — ED Notes (Signed)
Patient is being discharged from the Urgent Care and sent to the Emergency Department via pov . Per kelly tate np, patient is in need of higher level of care due to abdominal pain with pregnancy. Patient is aware and verbalizes understanding of plan of care.  Vitals:   05/06/23 1930  Pulse: 89  Resp: 18  Temp: 98.5 F (36.9 C)  SpO2: 97%

## 2023-05-06 NOTE — ED Triage Notes (Addendum)
Pt presents with continued sore throat. She had this last week and it got better but it started back today and everything she eats tastes weird. Feeling feverish.  Pt is 3 months pregnant and states her entire abdomen hurts and brought her to tears earlier.

## 2023-05-06 NOTE — ED Triage Notes (Signed)
Pt arrived via POV with reports of abdominal pain during pregnancy and continued sore throat.  Pt states she is about 12-[redacted] weeks pregnant c/o L side abd pain, denies any bleeding.  Pt also states she was dx with strep throat about 2 weeks ago, states she completed antibiotics about 1-2 days ago. Pt continues to feel pain when swallowing and altered sense of taste.

## 2023-05-07 ENCOUNTER — Emergency Department: Payer: Managed Care, Other (non HMO)

## 2023-05-07 ENCOUNTER — Emergency Department
Admission: EM | Admit: 2023-05-07 | Discharge: 2023-05-07 | Disposition: A | Discharge status: Home / Self Care | Payer: Managed Care, Other (non HMO) | Attending: Emergency Medicine | Admitting: Emergency Medicine

## 2023-05-07 DIAGNOSIS — N949 Unspecified condition associated with female genital organs and menstrual cycle: Secondary | ICD-10-CM

## 2023-05-07 DIAGNOSIS — O99820 Streptococcus B carrier state complicating pregnancy: Secondary | ICD-10-CM | POA: Diagnosis not present

## 2023-05-07 DIAGNOSIS — J020 Streptococcal pharyngitis: Secondary | ICD-10-CM

## 2023-05-07 LAB — SARS CORONAVIRUS 2 BY RT PCR: SARS Coronavirus 2 by RT PCR: NEGATIVE

## 2023-05-07 LAB — HCG, QUANTITATIVE, PREGNANCY: hCG, Beta Chain, Quant, S: 132859 m[IU]/mL — ABNORMAL HIGH (ref <5)

## 2023-05-07 MED ORDER — PENICILLIN G BENZATHINE 1200000 UNIT/2ML IM SUSY
1.2000 10*6.[IU] | PREFILLED_SYRINGE | Freq: Once | INTRAMUSCULAR | Status: AC
  Administered 2023-05-07: 1.2 10*6.[IU] via INTRAMUSCULAR
  Filled 2023-05-07: qty 2

## 2023-05-07 MED ORDER — ACETAMINOPHEN 500 MG PO TABS
1000.0000 mg | ORAL_TABLET | Freq: Once | ORAL | Status: AC
  Administered 2023-05-07: 1000 mg via ORAL
  Filled 2023-05-07: qty 2

## 2023-05-07 NOTE — ED Notes (Signed)
..  The patient is A&OX4, ambulatory at d/c with independent steady gait, NAD. Pt verbalized understanding of d/c instructions and follow up care.

## 2023-05-07 NOTE — Discharge Instructions (Addendum)
You may take Tylenol 1000 mg every 6 hours as needed for pain.  Please follow-up with your PCP and OB/GYN as needed.

## 2023-05-07 NOTE — ED Provider Notes (Signed)
[facility] Provider Note    Event Date/Time   First MD Initiated Contact with Patient 05/07/23 0144     (approximate)   History   Abdominal Pain and Sore Throat   HPI  Daisy Douglas is a 27 y.o. female G2, P1 at 14 weeks and 2 days pregnant who presents to the emergency department with continued sore throat.  Was recently diagnosed with strep pharyngitis and just finished a course of oral antibiotics.  Still having sore throat.  Able to swallow.  No fever.  Also complaining of a couple days of intermittent left pelvic pain.  No vaginal bleeding, discharge, dysuria or hematuria.  Worse with changing positions.   History provided by patient.    Past Medical History:  Diagnosis Date   Dysmenorrhea    Gestational diabetes    Pre-diabetes    Type O blood, Rh negative 01/2021   Vitamin D deficiency     Past Surgical History:  Procedure Laterality Date   NO PAST SURGERIES      MEDICATIONS:  Prior to Admission medications   Medication Sig Start Date End Date Taking? Authorizing Provider  Prenatal Vit-Fe Fumarate-FA (MULTIVITAMIN-PRENATAL) 27-0.8 MG TABS tablet Take 1 tablet by mouth daily at 12 noon.    [provider]    Physical Exam   Triage Vital Signs: ED Triage Vitals  Encounter Vitals Group     BP 05/06/23 2308 122/69     Systolic BP Percentile --      Diastolic BP Percentile --      Pulse Rate 05/06/23 2308 88     Resp 05/06/23 2308 18     Temp 05/06/23 2308 98.6 F (37 C)     Temp Source 05/06/23 2308 Oral     SpO2 05/06/23 2308 98 %     Weight 05/06/23 2309 220 lb (99.8 kg)     Height 05/06/23 2309 5\' 4"  (1.626 m)     Head Circumference --      Peak Flow --      Pain Score 05/06/23 2309 8     Pain Loc --      Pain Education --      Exclude from Growth Chart --     Most recent vital signs: Vitals:   05/06/23 2308 05/07/23 0233  BP: 122/69 100/77  Pulse: 88 93  Resp: 18 17  Temp: 98.6 F (37 C)   SpO2:  98% 98%    CONSTITUTIONAL: Alert, responds appropriately to questions. Well-appearing; well-nourished HEAD: Normocephalic, atraumatic EYES: Conjunctivae clear, pupils appear equal, sclera nonicteric ENT: normal nose; moist mucous membranes, mild tonsillar hypertrophy without exudate, pharyngeal erythema noted, no uvular deviation, no trismus or drooling, tongue sits flat in the bottom of the mouth, handling secretions without difficulty, normal phonation NECK: Supple, normal ROM CARD: RRR; S1 and S2 appreciated RESP: Normal chest excursion without splinting or tachypnea; breath sounds clear and equal bilaterally; no wheezes, no rhonchi, no rales, no hypoxia or respiratory distress, speaking full sentences ABD/GI: Non-distended; soft, non-tender, no rebound, no guarding, no peritoneal signs BACK: The back appears normal EXT: Normal ROM in all joints; no deformity noted, no edema SKIN: Normal color for age and race; warm; no rash on exposed skin NEURO: Moves all extremities equally, normal speech PSYCH: The patient's mood and manner are appropriate.   ED Results / Procedures / Treatments   LABS: (all labs ordered are listed, but only abnormal results are displayed) Labs Reviewed  GROUP A  STREP BY PCR - Abnormal; Notable for the following components:      Result Value   Group A Strep by PCR DETECTED (*)    All other components within normal limits  COMPREHENSIVE METABOLIC PANEL - Abnormal; Notable for the following components:   Glucose, Bld 127 (*)    AST 14 (*)    All other components within normal limits  CBC - Abnormal; Notable for the following components:   Hemoglobin 10.4 (*)    HCT 31.8 (*)    MCV 74.0 (*)    MCH 24.2 (*)    RDW 16.2 (*)    All other components within normal limits  URINALYSIS, ROUTINE W REFLEX MICROSCOPIC - Abnormal; Notable for the following components:   Color, Urine YELLOW (*)    APPearance CLEAR (*)    Leukocytes,Ua TRACE (*)    All other  components within normal limits  HCG, QUANTITATIVE, PREGNANCY - Abnormal; Notable for the following components:   hCG, Beta Chain, Quant, S 132,859 (*)    All other components within normal limits  SARS CORONAVIRUS 2 BY RT PCR  LIPASE, BLOOD     EKG:   RADIOLOGY: My personal review and interpretation of imaging: Ultrasound shows single IUP with normal fetal heart rate.  I have personally reviewed all radiology reports.   US OB LESS THAN 14 WEEKS WITH OB TRANSVAGINAL  Result Date: 05/07/2023 CLINICAL DATA:  Lower abdominal pain for 3-4 days. Beta HCG 132,000. LMP 01/27/2023 EXAM: OBSTETRIC <14 WK [place] AND TRANSVAGINAL OB US TECHNIQUE: Both transabdominal and transvaginal ultrasound examinations were performed for complete evaluation of the gestation as well as the maternal uterus, adnexal regions, and pelvic cul-de-sac. Transvaginal technique was performed to assess early pregnancy. COMPARISON:  Ultrasound 04/12/2023 FINDINGS: Intrauterine gestational sac: Single Yolk sac:  Visualized. Embryo:  Visualized. Cardiac Activity: Visualized. Heart Rate: 176 bpm CRL:  29 mm   9 w   5 d                  [place] EDC: 12/05/2023 Subchorionic hemorrhage: None visualized. The previous subchorionic hemorrhage has resolved. Maternal uterus/adnexae: Unremarkable ovaries. Trace free fluid in pelvis. IMPRESSION: Single viable intrauterine pregnancy. No acute sonographic abnormality. Electronically Signed   By: Minerva Fester M.D.   On: 05/07/2023 02:02     PROCEDURES:  Critical Care performed: No   CRITICAL CARE Performed by: Baxter Hire Imaan Padgett   Total critical care time: 0 minutes  Critical care time was exclusive of separately billable procedures and treating other patients.  Critical care was necessary to treat or prevent imminent or life-threatening deterioration.  Critical care was time spent personally by me on the following activities: development of treatment plan with patient and/or surrogate as well  as nursing, discussions with consultants, evaluation of patient's response to treatment, examination of patient, obtaining history from patient or surrogate, ordering and performing treatments and interventions, ordering and review of laboratory studies, ordering and review of radiographic studies, pulse oximetry and re-evaluation of patient's condition.   Procedures    IMPRESSION / MDM / ASSESSMENT AND PLAN / ED COURSE  I reviewed the triage vital signs and the nursing notes.    Patient here with symptoms of pharyngitis and likely round ligament pain.     DIFFERENTIAL DIAGNOSIS (includes but not limited to):   Viral pharyngitis, strep pharyngitis, doubt deep space neck infection, PTA, uvulitis  Suspect round ligament pain causing left pelvic pain.  Differential also includes UTI.  Doubt torsion, ectopic,  PID, TOA.   Patient's presentation is most consistent with acute complicated illness / injury requiring diagnostic workup.   PLAN: Workup initiated from triage.  No leukocytosis.  Mild anemia.  Normal electrolytes, LFTs.  Urine shows no ketones or sign of infection.  She is positive for strep today negative for COVID.  Will give IM penicillin.  Pelvic ultrasound pending.  Will give Tylenol for pain but abdominal exam is benign.  She denies any other GU symptoms.   MEDICATIONS GIVEN IN ED: Medications  penicillin g benzathine (BICILLIN LA) 1200000 UNIT/2ML injection 1.2 Million Units (1.2 Million Units Intramuscular Given 05/07/23 0230)  acetaminophen (TYLENOL) tablet 1,000 mg (1,000 mg Oral Given 05/07/23 0229)     ED COURSE: Ultrasound reviewed and interpreted by myself and the radiologist and shows no acute abnormality.  Normal fetal heart rate.  Recommended pregnancy belt for discomfort, Tylenol as needed.  I feel she is safe for discharge home follow-up with her OB/GYN and PCP.    At this time, I do not feel there is any life-threatening condition present. I reviewed all  nursing notes, vitals, pertinent previous records.  All lab and urine results, EKGs, imaging ordered have been independently reviewed and interpreted by myself.  I reviewed all available radiology reports from any imaging ordered this visit.  Based on my assessment, I feel the patient is safe to be discharged home without further emergent workup and can continue workup as an outpatient as needed. Discussed all findings, treatment plan as well as usual and customary return precautions.  They verbalize understanding and are comfortable with this plan.  Outpatient follow-up has been provided as needed.  All questions have been answered.    CONSULTS:  none   OUTSIDE RECORDS REVIEWED: Reviewed last internal medicine visit on 04/24/2023.       FINAL CLINICAL IMPRESSION(S) / ED DIAGNOSES   Final diagnoses:  Strep pharyngitis  Round ligament pain     Rx / DC Orders   ED Discharge Orders     None        Note:  This document was prepared using Dragon voice recognition software and may include unintentional dictation errors.   Porchia Sinkler, Layla Maw, DO 05/07/23 [phone]

## 2023-05-24 ENCOUNTER — Ambulatory Visit (INDEPENDENT_AMBULATORY_CARE_PROVIDER_SITE_OTHER): Payer: Managed Care, Other (non HMO) | Admitting: Obstetrics

## 2023-05-24 VITALS — BP 114/57 | HR 93 | Wt 223.0 lb

## 2023-05-24 DIAGNOSIS — Z131 Encounter for screening for diabetes mellitus: Secondary | ICD-10-CM

## 2023-05-24 DIAGNOSIS — Z3A12 12 weeks gestation of pregnancy: Secondary | ICD-10-CM

## 2023-05-24 DIAGNOSIS — Z3481 Encounter for supervision of other normal pregnancy, first trimester: Secondary | ICD-10-CM | POA: Diagnosis not present

## 2023-05-24 DIAGNOSIS — Z1379 Encounter for other screening for genetic and chromosomal anomalies: Secondary | ICD-10-CM

## 2023-05-24 DIAGNOSIS — D649 Anemia, unspecified: Secondary | ICD-10-CM

## 2023-05-24 DIAGNOSIS — Z113 Encounter for screening for infections with a predominantly sexual mode of transmission: Secondary | ICD-10-CM

## 2023-05-24 DIAGNOSIS — Z6838 Body mass index (BMI) 38.0-38.9, adult: Secondary | ICD-10-CM

## 2023-05-24 DIAGNOSIS — O09299 Supervision of pregnancy with other poor reproductive or obstetric history, unspecified trimester: Secondary | ICD-10-CM

## 2023-05-24 DIAGNOSIS — Z13220 Encounter for screening for lipoid disorders: Secondary | ICD-10-CM

## 2023-05-24 DIAGNOSIS — O34521 Maternal care for prolapse of gravid uterus, first trimester: Secondary | ICD-10-CM

## 2023-05-24 DIAGNOSIS — Z0283 Encounter for blood-alcohol and blood-drug test: Secondary | ICD-10-CM

## 2023-05-24 DIAGNOSIS — Z0184 Encounter for antibody response examination: Secondary | ICD-10-CM

## 2023-05-24 DIAGNOSIS — N814 Uterovaginal prolapse, unspecified: Secondary | ICD-10-CM | POA: Insufficient documentation

## 2023-05-24 MED ORDER — ONDANSETRON HCL 4 MG PO TABS: 4.0000 mg | ORAL_TABLET | Freq: Three times a day (TID) | ORAL | 0 refills | Status: DC | PRN

## 2023-05-24 MED ORDER — OMEPRAZOLE MAGNESIUM 20 MG PO TBEC: 20.0000 mg | DELAYED_RELEASE_TABLET | Freq: Two times a day (BID) | ORAL | 0 refills | Status: DC

## 2023-05-24 MED ORDER — ASPIRIN 81 MG PO TBEC: 162.0000 mg | DELAYED_RELEASE_TABLET | Freq: Every day | ORAL | 12 refills | Status: DC
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
## 2023-05-25 LAB — CBC/D/PLT+RPR+RH+ABO+RUBIGG...
Antibody Screen: NEGATIVE
Basophils Absolute: 0 10*3/uL (ref 0.0–0.2)
Basos: 0 %
EOS (ABSOLUTE): 0.1 10*3/uL (ref 0.0–0.4)
Eos: 1 %
HCV Ab: NONREACTIVE
HIV Screen 4th Generation wRfx: NONREACTIVE
Hematocrit: 33.3 % — ABNORMAL LOW (ref 34.0–46.6)
Hemoglobin: 10.6 g/dL — ABNORMAL LOW (ref 11.1–15.9)
Hepatitis B Surface Ag: NEGATIVE
Immature Grans (Abs): 0 10*3/uL (ref 0.0–0.1)
Immature Granulocytes: 0 %
Lymphocytes Absolute: 2 10*3/uL (ref 0.7–3.1)
Lymphs: 25 %
MCH: 24.4 pg — ABNORMAL LOW (ref 26.6–33.0)
MCHC: 31.8 g/dL (ref 31.5–35.7)
MCV: 77 fL — ABNORMAL LOW (ref 79–97)
Monocytes Absolute: 0.6 10*3/uL (ref 0.1–0.9)
Monocytes: 7 %
Neutrophils Absolute: 5.3 10*3/uL (ref 1.4–7.0)
Neutrophils: 67 %
Platelets: 230 10*3/uL (ref 150–450)
RBC: 4.34 x10E6/uL (ref 3.77–5.28)
RDW: 15.5 % — ABNORMAL HIGH (ref 11.7–15.4)
RPR Ser Ql: NONREACTIVE
Rh Factor: NEGATIVE
Rubella Antibodies, IGG: 1.99 {index} (ref >0.99)
Varicella zoster IgG: NONREACTIVE
WBC: 8.1 10*3/uL (ref 3.4–10.8)

## 2023-05-25 LAB — COMPREHENSIVE METABOLIC PANEL
ALT: 10 [IU]/L (ref 0–32)
AST: 13 [IU]/L (ref 0–40)
Albumin: 4 g/dL (ref 4.0–5.0)
Alkaline Phosphatase: 98 [IU]/L (ref 44–121)
BUN/Creatinine Ratio: 10 (ref 9–23)
BUN: 6 mg/dL (ref 6–20)
Bilirubin Total: 0.3 mg/dL (ref 0.0–1.2)
CO2: 20 mmol/L (ref 20–29)
Calcium: 9.3 mg/dL (ref 8.7–10.2)
Chloride: 101 mmol/L (ref 96–106)
Creatinine, Ser: 0.59 mg/dL (ref 0.57–1.00)
Globulin, Total: 3.1 g/dL (ref 1.5–4.5)
Glucose: 97 mg/dL (ref 70–99)
Potassium: 4.3 mmol/L (ref 3.5–5.2)
Sodium: 136 mmol/L (ref 134–144)
Total Protein: 7.1 g/dL (ref 6.0–8.5)
eGFR: 129 mL/min/{1.73_m2} (ref >59)

## 2023-05-25 LAB — HEMOGLOBIN A1C
Est. average glucose Bld gHb Est-mCnc: 128 mg/dL
Hgb A1c MFr Bld: 6.1 % — ABNORMAL HIGH (ref 4.8–5.6)

## 2023-05-25 LAB — HCV INTERPRETATION

## 2023-05-26 ENCOUNTER — Encounter: Payer: Self-pay | Admitting: Obstetrics

## 2023-05-26 ENCOUNTER — Other Ambulatory Visit: Payer: Self-pay | Admitting: Obstetrics

## 2023-05-26 DIAGNOSIS — R7309 Other abnormal glucose: Secondary | ICD-10-CM

## 2023-05-26 LAB — CULTURE, OB URINE

## 2023-05-26 LAB — URINE CULTURE, OB REFLEX

## 2023-05-26 MED ORDER — ACCRUFER 30 MG PO CAPS: 30.0000 mg | ORAL_CAPSULE | Freq: Two times a day (BID) | ORAL | 6 refills | Status: DC

## 2023-05-26 NOTE — Progress Notes (Signed)
HgbA1C 6.1 at NOB. Keirra notified and 1-hour glucose test ordered.  Glenetta Borg, CNM

## 2023-05-27 LAB — PROTEIN / CREATININE RATIO, URINE
Creatinine, Urine: 324.8 mg/dL
Protein, Ur: 37.3 mg/dL
Protein/Creat Ratio: 115 mg/g{creat} (ref 0–200)

## 2023-05-30 LAB — MATERNIT 21 PLUS CORE, BLOOD
Fetal Fraction: 9
Result (T21): NEGATIVE
Trisomy 13 (Patau syndrome): NEGATIVE
Trisomy 18 (Edwards syndrome): NEGATIVE
Trisomy 21 (Down syndrome): NEGATIVE

## 2023-05-31 ENCOUNTER — Other Ambulatory Visit: Payer: Managed Care, Other (non HMO)

## 2023-06-01 ENCOUNTER — Encounter: Payer: Self-pay | Admitting: Obstetrics

## 2023-06-03 ENCOUNTER — Other Ambulatory Visit: Payer: Managed Care, Other (non HMO)

## 2023-06-04 ENCOUNTER — Other Ambulatory Visit: Payer: Managed Care, Other (non HMO)

## 2023-06-05 LAB — GLUCOSE TOLERANCE, 1 HOUR: Glucose, 1Hr PP: 135 mg/dL (ref 70–199)

## 2023-06-21 ENCOUNTER — Encounter: Payer: Self-pay | Admitting: Obstetrics

## 2023-06-21 ENCOUNTER — Ambulatory Visit (INDEPENDENT_AMBULATORY_CARE_PROVIDER_SITE_OTHER): Payer: Managed Care, Other (non HMO) | Admitting: Obstetrics

## 2023-06-21 ENCOUNTER — Encounter: Payer: Managed Care, Other (non HMO) | Admitting: Obstetrics

## 2023-06-21 VITALS — BP 113/76 | HR 79 | Wt 232.0 lb

## 2023-06-21 DIAGNOSIS — R399 Unspecified symptoms and signs involving the genitourinary system: Secondary | ICD-10-CM

## 2023-06-21 DIAGNOSIS — Z3480 Encounter for supervision of other normal pregnancy, unspecified trimester: Secondary | ICD-10-CM

## 2023-06-21 DIAGNOSIS — Z3689 Encounter for other specified antenatal screening: Secondary | ICD-10-CM

## 2023-06-21 LAB — POCT URINALYSIS DIPSTICK OB
Bilirubin, UA: NEGATIVE
Blood, UA: NEGATIVE
Glucose, UA: NEGATIVE
Ketones, UA: NEGATIVE
Leukocytes, UA: NEGATIVE
Nitrite, UA: NEGATIVE
Spec Grav, UA: 1.02 (ref 1.010–1.025)
Urobilinogen, UA: 0.2 U/dL
pH, UA: 6 (ref 5.0–8.0)

## 2023-06-21 NOTE — Assessment & Plan Note (Addendum)
-  Anatomy scan ordered -Urine culture sent -Reviewed EDD of 12/05/23 -Discussed comfort measures for common discomforts of 2nd trimester -Reviewed danger signs -Encouraged healthy eating habits and regular exercise

## 2023-06-21 NOTE — Progress Notes (Signed)
    Return Prenatal Note   Assessment/Plan   Plan  27 y.o. G2P1001 at [redacted]w[redacted]d presents for follow-up OB visit. Reviewed prenatal record including previous visit note.  Supervision of other normal pregnancy, antepartum -Anatomy scan ordered -Urine culture sent -Reviewed EDD of 12/05/23 -Discussed comfort measures for common discomforts of 2nd trimester -Reviewed danger signs -Encouraged healthy eating habits and regular exercise   Orders Placed This Encounter  Procedures   Urine Culture   US OB Comp + 14 Wk    Standing Status:   Future    Standing Expiration Date:   06/20/2024    Order Specific Question:   Reason for Exam (SYMPTOM  OR DIAGNOSIS REQUIRED)    Answer:   anatomy scan    Order Specific Question:   Preferred Imaging Location?    Answer:   [facility]   POC Urinalysis Dipstick OB   Return in about 4 weeks (around 07/19/2023).   Future Appointments  Date Time Provider Department Center  07/22/2023  8:15 AM [facility] [place] 1 [facility]G None  07/22/2023  9:35 AM Mirna Mires, CNM [facility] None    For next visit:  continue with routine prenatal care     Subjective   Belenda has been feeling some baby movements. She frequently has pelvic pressure that brings her to tears. She is relieved that her early glucose test was negative and is trying to make healthy choices to avoid GDM this pregnancy. She is disappointed because she thought she was 20 weeks and not 16 weeks. She is looking forward to her boyfriend being released from prison soon.  Movement: Present Contractions: Not present  Objective   Flow sheet Vitals: Pulse Rate: 79 BP: 113/76 Fetal Heart Rate (bpm): 148 Total weight gain: 22 lb (9.979 kg)  General Appearance  No acute distress, well appearing, and well nourished Pulmonary   Normal work of breathing Neurologic   Alert and oriented to person, place, and time Psychiatric   Mood and affect within normal limits  Guadlupe Spanish,  CNM 06/21/23 3:34 PM

## 2023-06-25 LAB — URINE CULTURE

## 2023-07-22 ENCOUNTER — Ambulatory Visit: Payer: Managed Care, Other (non HMO)

## 2023-07-22 ENCOUNTER — Ambulatory Visit (INDEPENDENT_AMBULATORY_CARE_PROVIDER_SITE_OTHER): Payer: Managed Care, Other (non HMO) | Admitting: Obstetrics

## 2023-07-22 ENCOUNTER — Telehealth: Payer: Self-pay | Admitting: Obstetrics

## 2023-07-22 VITALS — BP 114/58 | HR 86 | Wt 236.7 lb

## 2023-07-22 DIAGNOSIS — Z3480 Encounter for supervision of other normal pregnancy, unspecified trimester: Secondary | ICD-10-CM

## 2023-07-22 DIAGNOSIS — Z3A20 20 weeks gestation of pregnancy: Secondary | ICD-10-CM

## 2023-07-22 LAB — POCT URINALYSIS DIPSTICK OB
Bilirubin, UA: NEGATIVE
Blood, UA: NEGATIVE
Glucose, UA: NEGATIVE
Ketones, UA: NEGATIVE
Leukocytes, UA: NEGATIVE
Nitrite, UA: NEGATIVE
Spec Grav, UA: 1.015 (ref 1.010–1.025)
Urobilinogen, UA: 0.2 U/dL
pH, UA: 6.5 (ref 5.0–8.0)

## 2023-07-22 MED ORDER — ONDANSETRON HCL 4 MG PO TABS: 4.0000 mg | ORAL_TABLET | Freq: Three times a day (TID) | ORAL | 0 refills | Status: DC | PRN

## 2023-07-22 NOTE — Telephone Encounter (Signed)
Pt did not come to [place] that was scheduled on 07/22/2023 at 8:15 at [facility].  Pt was given number to Centralized Scheduling.  She has rescheduled for 07/25/2023 at [facility] at 11:00.

## 2023-07-22 NOTE — Progress Notes (Signed)
Routine Prenatal Care Visit  Subjective  Daisy Douglas is a 27 y.o. G2P1001 at [redacted]w[redacted]d being seen today for ongoing prenatal care.  She is currently monitored for the following issues for this low-risk pregnancy and has Anxiety; Vitamin D deficiency; Pregnancy related bilateral lower abdominal pain, antepartum; Pregnancy; Uterine contractions; Supervision of other normal pregnancy, antepartum; and Uterine prolapse on their problem list.  ----------------------------------------------------------------------------------- Patient reports no complaints.  She slept through her scan this morning. It has been rescheduled. Still vomiting. Did not pick up her zofran. Contractions: Not present. Vag. Bleeding: None.  Movement: Present. Leaking Fluid denies.  ----------------------------------------------------------------------------------- The following portions of the patient's history were reviewed and updated as appropriate: allergies, current medications, past family history, past medical history, past social history, past surgical history and problem list. Problem list updated.  Objective  Blood pressure (!) 114/58, pulse 86, weight 236 lb 11.2 oz (107.4 kg), last menstrual period 01/27/2023, unknown if currently breastfeeding. Pregravid weight 210 lb (95.3 kg) Total Weight Gain 26 lb 11.2 oz (12.1 kg) Urinalysis: Urine Protein Trace  Urine Glucose Negative  Fetal Status: Fetal Heart Rate (bpm): 150   Movement: Present     General:  Alert, oriented and cooperative. Patient is in no acute distress.  Skin: Skin is warm and dry. No rash noted.   Cardiovascular: Normal heart rate noted  Respiratory: Normal respiratory effort, no problems with respiration noted  Abdomen: Soft, gravid, appropriate for gestational age. Pain/Pressure: Present     Pelvic:  Cervical exam deferred        Extremities: Normal range of motion.  Edema: None  Mental Status: Normal mood and affect. Normal behavior. Normal judgment  and thought content.   Assessment   27 y.o. G2P1001 at [redacted]w[redacted]d by  12/05/2023, by Ultrasound presenting for routine prenatal visit  Plan   second Problems (from 04/10/23 to present)     Problem Noted Resolved   Supervision of other normal pregnancy, antepartum 04/10/2023 by Loran Senters, CMA No   Overview Addendum 07/16/2023  4:39 PM by Tommie Raymond, CMA     Clinical Staff Provider  Office Location  [facility] Dating  Not found.  Language  English Anatomy US    Flu Vaccine  offer Genetic Screen  NIPS:   TDaP vaccine   offer Hgb A1C or  GTT Early : Third trimester :   Covid One booster   LAB RESULTS   Rhogam  O/Negative/-- (10/04 1119)  Blood Type O/Negative/-- (10/04 1119)   Feeding Plan breast Antibody Negative (10/04 1119)  Contraception undecided Rubella 1.99 (10/04 1119)  Circumcision yes RPR Non Reactive (10/04 1119)   Pediatrician  undecided HBsAg Negative (10/04 1119)   Support Person Iantha Fallen HIV Non Reactive (10/04 1119)  Prenatal Classes no Varicella Non Reactive (10/04 1119)    GBS  (For PCN allergy, check sensitivities)   BTL Consent  Hep C Non Reactive (10/04 1119)   VBAC Consent  Pap Diagnosis  Date Value Ref Range Status  03/14/2021   Final   - Negative for intraepithelial lesion or malignancy (NILM)      Hgb Electro      CF      SMA                    Preterm labor symptoms and general obstetric precautions including but not limited to vaginal bleeding, contractions, leaking of fluid and fetal movement were reviewed in detail with the patient. Please refer to After Visit Summary  for other counseling recommendations.  She will have her sono this Thursday. RX Zofran renewed.  Return in about 4 weeks (around 08/19/2023) for return OB.  Mirna Mires, CNM  07/22/2023 10:46 AM

## 2023-07-25 ENCOUNTER — Ambulatory Visit
Admission: RE | Admit: 2023-07-25 | Discharge: 2023-07-25 | Disposition: A | Discharge status: Home / Self Care | Payer: Managed Care, Other (non HMO) | Source: Ambulatory Visit | Attending: Obstetrics | Admitting: Obstetrics

## 2023-07-25 DIAGNOSIS — Z3689 Encounter for other specified antenatal screening: Secondary | ICD-10-CM | POA: Insufficient documentation

## 2023-07-31 ENCOUNTER — Other Ambulatory Visit: Payer: Self-pay | Admitting: Obstetrics

## 2023-07-31 ENCOUNTER — Encounter: Payer: Self-pay | Admitting: Obstetrics

## 2023-07-31 DIAGNOSIS — Z362 Encounter for other antenatal screening follow-up: Secondary | ICD-10-CM

## 2023-08-19 ENCOUNTER — Ambulatory Visit (INDEPENDENT_AMBULATORY_CARE_PROVIDER_SITE_OTHER): Payer: Managed Care, Other (non HMO) | Admitting: Obstetrics

## 2023-08-19 VITALS — BP 125/53 | HR 76 | Wt 234.0 lb

## 2023-08-19 DIAGNOSIS — Z3A24 24 weeks gestation of pregnancy: Secondary | ICD-10-CM

## 2023-08-19 DIAGNOSIS — O99212 Obesity complicating pregnancy, second trimester: Secondary | ICD-10-CM

## 2023-08-19 DIAGNOSIS — O360120 Maternal care for anti-D [Rh] antibodies, second trimester, not applicable or unspecified: Secondary | ICD-10-CM

## 2023-08-19 DIAGNOSIS — O26892 Other specified pregnancy related conditions, second trimester: Secondary | ICD-10-CM

## 2023-08-19 NOTE — Progress Notes (Signed)
    Return Prenatal Note   Subjective  27 y.o. G2P1001 at [redacted]w[redacted]d presents for this follow-up prenatal visit.   Patient well today, no concerns. Wondering who will be delivering her, wanting to meet more of the CNMs.   Patient reports: Movement: Present Contractions: Not present Denies vaginal bleeding or leaking fluid. Objective  Flow sheet Vitals: Pulse Rate: 76 BP: (!) 125/53 Fundal Height: 25 cm Fetal Heart Rate (bpm): 135 Total weight gain: 24 lb (10.9 kg)  General Appearance  No acute distress, well appearing, and well nourished Pulmonary   Normal work of breathing Neurologic   Alert and oriented to person, place, and time Psychiatric   Mood and affect within normal limits  Assessment/Plan   Plan  27 y.o. G2P1001 at [redacted]w[redacted]d by 9wk [place] presents for follow-up OB visit. Reviewed prenatal record including previous visit note.  1. Severe obesity due to excess calories affecting pregnancy in second trimester (HCC) (Primary) - BMI 40, discussed limiting weight gain in pregnancy and increasing physical activity.  - Will need Q4wk growth [place] at 32wks, weekly NST at 36wks - Discussed 28wk labs, Tdap, and Rhogam for next visit, ordered.   2. Rh negative status during pregnancy in second trimester - Rhogam next visit   Orders Placed This Encounter  Procedures   28 Weeks RH-Panel   Return in about 4 weeks (around 09/16/2023) for rob and 28wk labs/gtt test .   Future Appointments  Date Time Provider Department Center  08/26/2023 11:00 AM [facility]  09/16/2023  9:40 AM [facility]-OBGYN LAB [facility] None  09/16/2023 10:35 AM Free, Lindalou Hose, CNM AOB-AOB None    For next visit:  ROB with 1 hour gluocla, third trimester labs, and Tdap; encouraged pt to ask schedulers to rotate her with more of the CNMs.     Julieanne Manson, DO [facility]

## 2023-08-21 NOTE — L&D Delivery Note (Signed)
      Delivery Note   Elleigh Cassetta is a 28 y.o. G2P1001 at [redacted]w[redacted]d Estimated Date of Delivery: 12/05/23  PRE-OPERATIVE DIAGNOSIS:  1) [redacted]w[redacted]d pregnancy. IUGR    POST-OPERATIVE DIAGNOSIS:  1) [redacted]w[redacted]d pregnancy s/p Vaginal, Spontaneous IUGR  Delivery Type: Vaginal, Spontaneous   Delivery Anesthesia: Epidural  Labor Complications:  none    ESTIMATED BLOOD LOSS: 75 ml    FINDINGS:   1) female infant, Apgar scores of    at 1 minute and    at 5 minutes and a birthweight of 99.12 ounces.    2) Nuchal cord: yes, loose x 1 . Easily reduced   SPECIMENS:   PLACENTA:   Appearance: Intact, 3 vessel cord. Cord blood sample collected    Removal: Spontaneous     Disposition:  per protocol   DISPOSITION:  Infant to left in stable condition in the delivery room, with L&D personnel and mother,  NARRATIVE SUMMARY: Labor course:  Ms. Anshi Jalloh is a G2P1001 at [redacted]w[redacted]d who presented for induction of labor due to IUGR.  She progressed well in labor with pitocin.  She received the appropriate epidural anesthesia and proceeded to complete dilation. She evidenced good maternal expulsive effort during the second stage. She went on to deliver a viable female infant. The placenta delivered without problems and was noted to be complete. A perineal and vaginal examination was performed. Episiotomy/Lacerations: None.  The patient tolerated this well.  Pattricia Boss Nakina Spatz,CNM  11/25/2023 9:32 PM

## 2023-08-26 ENCOUNTER — Ambulatory Visit
Admission: RE | Admit: 2023-08-26 | Discharge: 2023-08-26 | Disposition: A | Discharge status: Home / Self Care | Payer: Managed Care, Other (non HMO) | Source: Ambulatory Visit | Attending: Obstetrics | Admitting: Obstetrics

## 2023-08-26 DIAGNOSIS — Z362 Encounter for other antenatal screening follow-up: Secondary | ICD-10-CM | POA: Insufficient documentation

## 2023-09-16 ENCOUNTER — Other Ambulatory Visit: Payer: Managed Care, Other (non HMO)

## 2023-09-17 ENCOUNTER — Other Ambulatory Visit: Payer: Managed Care, Other (non HMO)

## 2023-09-17 ENCOUNTER — Ambulatory Visit (INDEPENDENT_AMBULATORY_CARE_PROVIDER_SITE_OTHER): Payer: Managed Care, Other (non HMO) | Admitting: Certified Nurse Midwife

## 2023-09-17 VITALS — BP 111/61 | HR 91 | Wt 229.5 lb

## 2023-09-17 DIAGNOSIS — Z23 Encounter for immunization: Secondary | ICD-10-CM

## 2023-09-17 DIAGNOSIS — Z3483 Encounter for supervision of other normal pregnancy, third trimester: Secondary | ICD-10-CM

## 2023-09-17 DIAGNOSIS — Z3A28 28 weeks gestation of pregnancy: Secondary | ICD-10-CM

## 2023-09-17 LAB — POCT URINALYSIS DIPSTICK OB
Bilirubin, UA: NEGATIVE
Blood, UA: NEGATIVE
Glucose, UA: NEGATIVE
Ketones, UA: NEGATIVE
Leukocytes, UA: NEGATIVE
Nitrite, UA: NEGATIVE
POC,PROTEIN,UA: NEGATIVE
Spec Grav, UA: 1.01 (ref 1.010–1.025)
Urobilinogen, UA: 0.2 U/dL
pH, UA: 6.5 (ref 5.0–8.0)

## 2023-09-17 MED ORDER — ONDANSETRON 4 MG PO TBDP: 4.0000 mg | ORAL_TABLET | Freq: Three times a day (TID) | ORAL | 1 refills | Status: DC | PRN

## 2023-09-17 NOTE — Progress Notes (Signed)
ROB doing well. Feels good movement. Pt has had some nausea , vomiting , and diarrhea recently. Discussed likely gastrointestinal illness. Orders placed for Zofran. Red flag symptoms reviewed. 28 wk labs today: Glucose screen/RPR/CBC. Tdap done, Blood transfusion consent completed, all questions answered. Discussed birth plan given, will follow up in upcoming visits. Discussed birth control after delivery, information pamphlet given.   Follow up 2 wk  for ROB or sooner if needed.    Doreene Burke, CNM

## 2023-09-17 NOTE — Patient Instructions (Addendum)
Tdap (Tetanus, Diphtheria, Pertussis) Vaccine: What You Need to Know Many vaccine information statements are available in Spanish and other languages. See PromoAge.com.br. 1. Why get vaccinated? Tdap vaccine can prevent tetanus, diphtheria, and pertussis. Diphtheria and pertussis spread from person to person. Tetanus enters the body through cuts or wounds. TETANUS (T) causes painful stiffening of the muscles. Tetanus can lead to serious health problems, including being unable to open the mouth, having trouble swallowing and breathing, or death. DIPHTHERIA (D) can lead to difficulty breathing, heart failure, paralysis, or death. PERTUSSIS (aP), also known as "whooping cough," can cause uncontrollable, violent coughing that makes it hard to breathe, eat, or drink. Pertussis can be extremely serious especially in babies and young children, causing pneumonia, convulsions, brain damage, or death. In teens and adults, it can cause weight loss, loss of bladder control, passing out, and rib fractures from severe coughing. 2. Tdap vaccine Tdap is only for children 7 years and older, adolescents, and adults.  Adolescents should receive a single dose of Tdap, preferably at age 1 or 12 years. Pregnant people should get a dose of Tdap during every pregnancy, preferably during the early part of the third trimester, to help protect the newborn from pertussis. Infants are most at risk for severe, life-threatening complications from pertussis. Adults who have never received Tdap should get a dose of Tdap. Also, adults should receive a booster dose of either Tdap or Td (a different vaccine that protects against tetanus and diphtheria but not pertussis) every 10 years, or after 5 years in the case of a severe or dirty wound or burn. Tdap may be given at the same time as other vaccines. 3. Talk with your health care provider Tell your vaccine provider if the person getting the vaccine: Has had an allergic  reaction after a previous dose of any vaccine that protects against tetanus, diphtheria, or pertussis, or has any severe, life-threatening allergies Has had a coma, decreased level of consciousness, or prolonged seizures within 7 days after a previous dose of any pertussis vaccine (DTP, DTaP, or Tdap) Has seizures or another nervous system problem Has ever had Guillain-Barr Syndrome (also called "GBS") Has had severe pain or swelling after a previous dose of any vaccine that protects against tetanus or diphtheria In some cases, your health care provider may decide to postpone Tdap vaccination until a future visit. People with minor illnesses, such as a cold, may be vaccinated. People who are moderately or severely ill should usually wait until they recover before getting Tdap vaccine.  Your health care provider can give you more information. 4. Risks of a vaccine reaction Pain, redness, or swelling where the shot was given, mild fever, headache, feeling tired, and nausea, vomiting, diarrhea, or stomachache sometimes happen after Tdap vaccination. People sometimes faint after medical procedures, including vaccination. Tell your provider if you feel dizzy or have vision changes or ringing in the ears.  As with any medicine, there is a very remote chance of a vaccine causing a severe allergic reaction, other serious injury, or death. 5. What if there is a serious problem? An allergic reaction could occur after the vaccinated person leaves the clinic. If you see signs of a severe allergic reaction (hives, swelling of the face and throat, difficulty breathing, a fast heartbeat, dizziness, or weakness), call 9-1-1 and get the person to the nearest hospital. For other signs that concern you, call your health care provider.  Adverse reactions should be reported to the Vaccine Adverse Event Reporting  System (VAERS). Your health care provider will usually file this report, or you can do it yourself. Visit the  VAERS website at www.vaers.LAgents.no or call 581-173-2486. VAERS is only for reporting reactions, and VAERS staff members do not give medical advice. 6. The National Vaccine Injury Compensation Program The [employer] Vaccine Injury Compensation Program (VICP) is a federal program that was created to compensate people who may have been injured by certain vaccines. Claims regarding alleged injury or death due to vaccination have a time limit for filing, which may be as short as two years. Visit the VICP website at SpiritualWord.at or call 807-029-5188 to learn about the program and about filing a claim. 7. How can I learn more? Ask your health care provider. Call your local or state health department. Visit the website of the Food and Drug Administration (FDA) for vaccine package inserts and additional information at FinderList.no. Contact the Centers for Disease Control and Prevention (CDC): Call 873-355-8522 (1-800-CDC-INFO) or Visit CDC's website at PicCapture.uy. Source: CDC Vaccine Information Statement Tdap (Tetanus, Diphtheria, Pertussis) Vaccine (03/25/2020) This same material is available at FootballExhibition.com.br for no charge. This information is not intended to replace advice given to you by your health care provider. Make sure you discuss any questions you have with your health care provider. Document Revised: 11/21/2022 Document Reviewed: 09/21/2022 Elsevier Patient Education  2024 [employer].   Third Trimester of Pregnancy  The third trimester of pregnancy is from week 28 through week 40. This is months 7 through 9. The third trimester is a time when your baby is growing fast. Body changes during your third trimester Your body continues to change during this time. The changes usually go away after your baby is born. Physical changes You will continue to gain weight. You may get stretch marks on your hips, belly, and breasts. Your  breasts will keep growing and may hurt. A yellow fluid (colostrum) may leak from your breasts. This is the first milk you're making for your baby. Your hair may grow faster and get thicker. In some cases, you may get hair loss. Your belly button may stick out. You may have more swelling in your hands, face, or ankles. Health changes You may have heartburn. You may feel short of breath. This is caused by the uterus that is now bigger. You may have more aches in the pelvis, back, or thighs. You may have more tingling or numbness in your hands, arms, and legs. You may pee more often. You may have trouble pooping (constipation) or swollen veins in the butt that can itch or get painful (hemorrhoids). Other changes You may have more problems sleeping. You may notice the baby moving lower in your belly (dropping). You may have more fluid coming from your vagina. Your joints may feel loose, and you may have pain around your pelvic bone. Follow these instructions at home: Medicines Take medicines only as told by your health care provider. Some medicines are not safe during pregnancy. Your provider may change the medicines that you take. Do not take any medicines unless told to by your provider. Take a prenatal vitamin that has at least 600 micrograms (mcg) of folic acid. Do not use herbal medicines, illegal drugs, or medicines that are not approved by your provider. Eating and drinking While you're pregnant your body needs additional nutrition to help support your growing baby. Talk with your provider about your nutritional needs. Activity Most women are able to exercise regularly during pregnancy. Exercise routines may need to  change at the end of your pregnancy. Talk to your provider about your activities and exercise routine. Relieving pain and discomfort Rest often with your legs raised if you have leg cramps or low back pain. Take warm sitz baths to soothe pain from hemorrhoids. Use  hemorrhoid cream if your provider says it's okay. Wear a good, supportive bra if your breasts hurt. Do not use hot tubs, steam rooms, or saunas. Do not douche. Do not use tampons or scented pads. Safety Talk to your provider before traveling far distances. Wear your seatbelt at all times when you're in a car. Talk to your provider if someone hits you, hurts you, or yells at you. Preparing for birth To prepare for your baby: Take childbirth and breastfeeding classes. Visit the hospital and tour the maternity area. Buy a rear-facing car seat. Learn how to install it in your car. General instructions Avoid cat litter boxes and soil used by cats. These things carry germs that can cause harm to your pregnancy and your baby. Do not drink alcohol, smoke, vape, or use products with nicotine or tobacco in them. If you need help quitting, talk with your provider. Keep all follow-up visits for your third trimester. Your provider will do more exams and tests during this trimester. Write down your questions. Take them to your prenatal visits. Your provider also will: Talk with you about your overall health. Give you advice or refer you to specialists who can help with different needs, including: Mental health and counseling. Foods and healthy eating. Ask for help if you need help with food. Where to find more information American Pregnancy Association: americanpregnancy.org [employer] of Obstetricians and Gynecologists: acog.org Office on [employer] Health: TravelLesson.ca Contact a health care provider if: You have a headache that does not go away when you take medicine. You have any of these problems: You can't eat or drink. You have nausea and vomiting. You have watery poop (diarrhea) for 2 days or more. You have pain when you pee, or your pee smells bad. You have been sick for 2 days or more and aren't getting better. Contact your provider right away if: You have any of these coming from  your vagina: Abnormal discharge. Bad-smelling fluid. Bleeding. Your baby is moving less than usual. You have signs of labor: You have any contractions, belly cramping, or have pain in your pelvis or lower back before 37 weeks of pregnancy (preterm labor). You have regular contractions that are less than 5 minutes apart. Your water breaks. You have symptoms of high blood pressure or preeclampsia. These include: A severe, throbbing headache that does not go away. Sudden or extreme swelling of your face, hands, legs, or feet. Vision problems: You see spots. You have blurry vision. Your eyes are sensitive to light. If you can't reach your provider, go to an urgent care or emergency room. Get help right away if: You faint, become confused, or can't think clearly. You have chest pain or trouble breathing. You have any kind of injury, such as from a fall or a car crash. These symptoms may be an emergency. Call 911 right away. Do not wait to see if the symptoms will go away. Do not drive yourself to the hospital. This information is not intended to replace advice given to you by your health care provider. Make sure you discuss any questions you have with your health care provider. Document Revised: 05/09/2023 Document Reviewed: 12/07/2022 Elsevier Patient Education  2024 [employer].

## 2023-09-19 LAB — 28 WEEKS RH-PANEL
Antibody Screen: NEGATIVE
Basophils Absolute: 0 10*3/uL (ref 0.0–0.2)
Basos: 1 %
EOS (ABSOLUTE): 0 10*3/uL (ref 0.0–0.4)
Eos: 0 %
Gestational Diabetes Screen: 165 mg/dL — ABNORMAL HIGH (ref 70–139)
HIV Screen 4th Generation wRfx: NONREACTIVE
Hematocrit: 30.5 % — ABNORMAL LOW (ref 34.0–46.6)
Hemoglobin: 9.6 g/dL — ABNORMAL LOW (ref 11.1–15.9)
Immature Grans (Abs): 0.1 10*3/uL (ref 0.0–0.1)
Immature Granulocytes: 1 %
Lymphocytes Absolute: 2.3 10*3/uL (ref 0.7–3.1)
Lymphs: 27 %
MCH: 25.1 pg — ABNORMAL LOW (ref 26.6–33.0)
MCHC: 31.5 g/dL (ref 31.5–35.7)
MCV: 80 fL (ref 79–97)
Monocytes Absolute: 0.5 10*3/uL (ref 0.1–0.9)
Monocytes: 6 %
Neutrophils Absolute: 5.5 10*3/uL (ref 1.4–7.0)
Neutrophils: 65 %
Platelets: 219 10*3/uL (ref 150–450)
RBC: 3.82 x10E6/uL (ref 3.77–5.28)
RDW: 14.2 % (ref 11.7–15.4)
RPR Ser Ql: NONREACTIVE
WBC: 8.3 10*3/uL (ref 3.4–10.8)

## 2023-09-20 ENCOUNTER — Other Ambulatory Visit: Payer: Self-pay | Admitting: Obstetrics

## 2023-09-20 ENCOUNTER — Encounter: Payer: Self-pay | Admitting: Obstetrics

## 2023-09-20 DIAGNOSIS — O99013 Anemia complicating pregnancy, third trimester: Secondary | ICD-10-CM

## 2023-09-20 DIAGNOSIS — O99810 Abnormal glucose complicating pregnancy: Secondary | ICD-10-CM

## 2023-09-20 MED ORDER — FERROUS SULFATE 325 (65 FE) MG PO TBEC: 325.0000 mg | DELAYED_RELEASE_TABLET | Freq: Two times a day (BID) | ORAL | 1 refills | Status: DC

## 2023-09-20 MED ORDER — DOCUSATE SODIUM 100 MG PO CAPS: 100.0000 mg | ORAL_CAPSULE | Freq: Two times a day (BID) | ORAL | 1 refills | Status: DC

## 2023-09-25 ENCOUNTER — Other Ambulatory Visit: Payer: Managed Care, Other (non HMO)

## 2023-09-25 DIAGNOSIS — O99810 Abnormal glucose complicating pregnancy: Secondary | ICD-10-CM

## 2023-09-26 LAB — GESTATIONAL GLUCOSE TOLERANCE
Glucose, Fasting: 83 mg/dL (ref 70–94)
Glucose, GTT - 1 Hour: 153 mg/dL (ref 70–179)
Glucose, GTT - 2 Hour: 118 mg/dL (ref 70–154)
Glucose, GTT - 3 Hour: 115 mg/dL (ref 70–139)

## 2023-10-01 ENCOUNTER — Encounter: Payer: Managed Care, Other (non HMO) | Admitting: Obstetrics

## 2023-10-01 ENCOUNTER — Encounter: Payer: Self-pay | Admitting: Obstetrics and Gynecology

## 2023-10-01 ENCOUNTER — Ambulatory Visit (INDEPENDENT_AMBULATORY_CARE_PROVIDER_SITE_OTHER): Payer: Managed Care, Other (non HMO) | Admitting: Obstetrics and Gynecology

## 2023-10-01 VITALS — BP 121/84 | HR 98 | Wt 233.5 lb

## 2023-10-01 DIAGNOSIS — Z3A30 30 weeks gestation of pregnancy: Secondary | ICD-10-CM | POA: Diagnosis not present

## 2023-10-01 DIAGNOSIS — Z3480 Encounter for supervision of other normal pregnancy, unspecified trimester: Secondary | ICD-10-CM

## 2023-10-01 DIAGNOSIS — O360130 Maternal care for anti-D [Rh] antibodies, third trimester, not applicable or unspecified: Secondary | ICD-10-CM

## 2023-10-01 DIAGNOSIS — Z6791 Unspecified blood type, Rh negative: Secondary | ICD-10-CM

## 2023-10-01 MED ORDER — RHO D IMMUNE GLOBULIN 1500 UNIT/2ML IJ SOSY
300.0000 ug | PREFILLED_SYRINGE | Freq: Once | INTRAMUSCULAR | Status: AC
  Administered 2023-10-01: 300 ug via INTRAMUSCULAR

## 2023-10-01 NOTE — Progress Notes (Signed)
ROB: Generally doing well.  Reports that she is not eating as healthily as she should but sometimes she does not feel like eating.  Reports very active fetal movement-"too active".  Had gestational diabetes with first pregnancy but has passed her 3-hour GTT with this pregnancy.  Has some concerns that this baby will be larger than her last baby.  We discussed natural labor and that typically second babies are easier than first.

## 2023-10-01 NOTE — Progress Notes (Signed)
ROB. Patient states daily fetal movement. Received RhoGAM today. Patient states no questions or concerns at this time.

## 2023-10-02 ENCOUNTER — Encounter: Payer: Self-pay | Admitting: Obstetrics

## 2023-10-04 ENCOUNTER — Other Ambulatory Visit: Payer: Self-pay | Admitting: Obstetrics

## 2023-10-04 DIAGNOSIS — O99013 Anemia complicating pregnancy, third trimester: Secondary | ICD-10-CM

## 2023-10-11 ENCOUNTER — Telehealth: Payer: Self-pay

## 2023-10-11 NOTE — Telephone Encounter (Signed)
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 She doesn't have time as needed to go to the restroom. She hasn't worked since 10/02/23. She asked that they not put her on the schedule, but they still have had her scheduled and are requesting a note for her absence. Advised can provide a general note with restrictions, but specific note for time missed will need to be authorized by a provider. It may need to be discussed at next appointment. She is scheduled for [facility] 10/16/23. Recommended for her to bring FMLA paperwork with her to her next appointment. Aware of $15 fee. General restrictions note sent via my chart.

## 2023-10-16 ENCOUNTER — Ambulatory Visit (INDEPENDENT_AMBULATORY_CARE_PROVIDER_SITE_OTHER): Payer: Managed Care, Other (non HMO) | Admitting: Obstetrics and Gynecology

## 2023-10-16 ENCOUNTER — Encounter: Payer: Self-pay | Admitting: Obstetrics and Gynecology

## 2023-10-16 VITALS — BP 116/81 | HR 94 | Wt 235.3 lb

## 2023-10-16 DIAGNOSIS — E669 Obesity, unspecified: Secondary | ICD-10-CM

## 2023-10-16 DIAGNOSIS — R102 Pelvic and perineal pain: Secondary | ICD-10-CM

## 2023-10-16 DIAGNOSIS — M549 Dorsalgia, unspecified: Secondary | ICD-10-CM | POA: Diagnosis not present

## 2023-10-16 DIAGNOSIS — O99891 Other specified diseases and conditions complicating pregnancy: Secondary | ICD-10-CM | POA: Diagnosis not present

## 2023-10-16 DIAGNOSIS — O99210 Obesity complicating pregnancy, unspecified trimester: Secondary | ICD-10-CM

## 2023-10-16 DIAGNOSIS — Z3A32 32 weeks gestation of pregnancy: Secondary | ICD-10-CM

## 2023-10-16 DIAGNOSIS — O26893 Other specified pregnancy related conditions, third trimester: Secondary | ICD-10-CM

## 2023-10-16 DIAGNOSIS — O99213 Obesity complicating pregnancy, third trimester: Secondary | ICD-10-CM

## 2023-10-16 DIAGNOSIS — Z3480 Encounter for supervision of other normal pregnancy, unspecified trimester: Secondary | ICD-10-CM

## 2023-10-16 LAB — POCT URINALYSIS DIPSTICK OB
Appearance: NORMAL
Bilirubin, UA: NEGATIVE
Blood, UA: NEGATIVE
Glucose, UA: NEGATIVE
Ketones, UA: NEGATIVE
Leukocytes, UA: NEGATIVE
Nitrite, UA: NEGATIVE
Odor: NORMAL
POC,PROTEIN,UA: NEGATIVE
Spec Grav, UA: 1.015 (ref 1.010–1.025)
Urobilinogen, UA: 0.2 U/dL
pH, UA: 5 (ref 5.0–8.0)

## 2023-10-16 NOTE — Progress Notes (Signed)
 ROB: Patient is a 28 y.o. G2P1001 at [redacted]w[redacted]d who presents for routine OB care.  Pregnancy is complicated by: Anxiety; Vitamin D deficiency; morbid obesity, third trimester; Supervision of other normal pregnancy, antepartum; and Uterine prolapse.    Patient has complaints of significant back and pelvic pain. Notes that she has not been able to work due to the pain. 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  States she works 12-13 hr shifts and is on her feet most of the time. Has requested reduced duties or hours from her job but continuously gets scheduled. Notes that she has come home some days crying. Has taken herself out of work since February 9th. Requesting work notice. Also states that her job has mentioned that she should consider going ahead and taking her maternity leave.   Discussed birth plans, also discussed pain management in labor. Included options for alternative pain management in labor. Notes she got an epidural last time but thinking of trying to go more natural.   Patient now with BMI at 40.  Will recommend antenatal testing beginning at 36 weeks and future growth scan to be ordered. Can discuss possibility of IOL by EDD, however patient notes her last pregnancy ended at 37 weeks, and this baby feels like it wants to come early as well. RTC in 2 weeks.

## 2023-10-21 ENCOUNTER — Encounter: Payer: Self-pay | Admitting: Physical Therapy

## 2023-10-21 ENCOUNTER — Ambulatory Visit: Payer: Managed Care, Other (non HMO) | Attending: Obstetrics and Gynecology | Admitting: Physical Therapy

## 2023-10-21 DIAGNOSIS — R2689 Other abnormalities of gait and mobility: Secondary | ICD-10-CM | POA: Diagnosis present

## 2023-10-21 DIAGNOSIS — M549 Dorsalgia, unspecified: Secondary | ICD-10-CM | POA: Insufficient documentation

## 2023-10-21 DIAGNOSIS — O99891 Other specified diseases and conditions complicating pregnancy: Secondary | ICD-10-CM | POA: Diagnosis not present

## 2023-10-21 DIAGNOSIS — R102 Pelvic and perineal pain: Secondary | ICD-10-CM | POA: Diagnosis not present

## 2023-10-21 DIAGNOSIS — M533 Sacrococcygeal disorders, not elsewhere classified: Secondary | ICD-10-CM | POA: Insufficient documentation

## 2023-10-21 DIAGNOSIS — O26893 Other specified pregnancy related conditions, third trimester: Secondary | ICD-10-CM | POA: Insufficient documentation

## 2023-10-21 DIAGNOSIS — M5459 Other low back pain: Secondary | ICD-10-CM | POA: Insufficient documentation

## 2023-10-21 NOTE — Patient Instructions (Signed)
 Emailed resources for [employer] and ob/gyn and PCP  __  Avoid straining pelvic floor, abdominal muscles , spine  Use log rolling technique instead of getting out of bed with your neck or the sit-up   Log rolling into and out of bed  Log rolling into and out of bed If getting out of bed on R side, Bent knees, scoot hips/ shoulder to L  Raise R arm completely overhead, rolling onto armpit  Then lower bent knees to bed to get into complete side lying position  Then drop legs off bed, and push up onto R elbow/forearm, and use L hand to push onto the bed  __ Proper body mechanics with getting out of a chair to decrease strain  on back &pelvic floor   Avoid holding your breath when Getting out of the chair:  Scoot to front part of chair chair Heels behind knees, feet are hip width apart, nose over toes  Inhale like you are smelling roses Exhale to stand  ___  Sitting with feet on ground, four points of contact Catch yourself crossing ankles and thighs  __  Wear SIJ belt

## 2023-10-21 NOTE — Therapy (Unsigned)
 OUTPATIENT PHYSICAL THERAPY EVALUATION   Patient Name: Daisy Douglas MRN: [id number] DOB:[birth year], 28 y.o., female Today's Date: 10/21/2023   PT End of Session - 10/21/23 0903     Visit Number 1    Number of Visits 10    Date for PT Re-Evaluation 12/30/23    PT Start Time 0900    PT Stop Time 0930    PT Time Calculation (min) 30 min    Activity Tolerance Patient tolerated treatment well;No increased pain    Behavior During Therapy [facility] for tasks assessed/performed             Past Medical History:  Diagnosis Date   Dysmenorrhea    Gestational diabetes    Pre-diabetes    Type O blood, Rh negative 01/2021   Vitamin D deficiency    Past Surgical History:  Procedure Laterality Date   NO PAST SURGERIES     Patient Active Problem List   Diagnosis Date Noted   Uterine prolapse 05/24/2023   Supervision of other normal pregnancy, antepartum 04/10/2023   Uterine contractions 10/19/2021   Pregnancy 10/02/2021   Pregnancy complicated by obesity, third trimester 07/10/2021   Anxiety 01/20/2019   Vitamin D deficiency 01/20/2019    PCP:   REFERRING PROVIDER: Valentino Saxon   REFERRING DIAG: pelvic and low back pain affecting pregnancy in third trimester, antepartum   Rationale for Evaluation and Treatment Rehabilitation  THERAPY DIAG:  Sacrococcygeal disorders, not elsewhere classified  Other abnormalities of gait and mobility  Other low back pain  ONSET DATE:   Due date for 2nd child 12/05/23   SUBJECTIVE:                                                                                                                                                                                           SUBJECTIVE STATEMENT:  Pt is pregnant with her 2nd child in her third trimester. 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 Pt plans to return to this job 9 weeks after delivery. Pt is in separation with father of her  child.  She stated she felt unsafe and he had made threats but not taken any harmful physical actions towards patients. Pt lives near her sister and parents.    1) pelvic pain  - feels like "shocks" when getting up from sitting and from bed , eases with walking , occurring  8/10  pain with sitting, lying in bed , standing up  , pain with stairs 3/10 . Feels pressure.    2) low back pain radiating-down R back  of the leg to her knee  -    8/10 pain hurts real bad without activities. She felt in laying down and it starts hurting   3) leakage with coughing, sneezing, brushing teeth  which started a month ago    PERTINENT HISTORY:  First pregnancy with vaginal delivery.    PAIN:  Are you having pain? Yes: see above   PRECAUTIONS: pregnancy   WEIGHT BEARING RESTRICTIONS: No   FALLS:  Has patient fallen in last 6 months? No   LIVING ENVIRONMENT: Lives with: Pt is in separation with father of her child.  She stated she felt unsafe and he had made threats but not taken any harmful physical actions towards patients. Pt lives near her sister and parents.   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 Stopped working 3 weeks ago. Pt plans to return to this job 9 weeks after delivery  PLOF:   PATIENT GOALS:  Improve pain and decrease leakage    OBJECTIVE:    [facility] PT Assessment       Squat   Comments bending to lift toddler with poor mechanics, locked knees,      Posture/Postural Control   Posture Comments slouching posture in sitting, locked knees in standing      Palpation   SI assessment  L iliac , R lowered      Bed Mobility   Bed Mobility --   poor mechanics            [facility] Adult PT Treatment/Exercise -       Self-Care   Self-Care Other Self-Care Comments    Other Self-Care Comments  provided and educated in       Neuro Re-ed    Neuro Re-ed Details  cued for sitting , sit to stand, standing posture with unlocked knees.               HOME EXERCISE PROGRAM: See pt instruction section    ASSESSMENT:  CLINICAL IMPRESSION:   Pt is a 28  yo  who  is pregnant with her 2nd child in her third trimester. She presents with  following issues which impact QOL, ADL,  fitness, social and community activities:   pelvic pain  low back pain radiating-down R back of the leg to her knee   leakage with coughing, sneezing, brushing teeth  which started a month ago   Pt's musculoskeletal assessment revealed uneven pelvic girdle and shoulder height, asymmetries to gait pattern, limited spinal /pelvic mobility, dyscoordination and strength of pelvic floor mm, hip weakness, poor body mechanics which places strain on the abdominal/pelvic floor mm. These are deficits that indicate an ineffective intraabdominal pressure system associated with increased risk for pt's Sx.   Pt was provided education on etiology of Sx with anatomy, physiology explanation with images along with the benefits of customized pelvic PT Tx based on pt's medical conditions and musculoskeletal deficits.  Explained the physiology of deep core mm coordination and roles of pelvic floor function in urination, defecation, sexual function, and postural control with deep core mm system.   Regional interdependent approaches will yield greater benefits in pt's POC due to the complexity of pt's medical Hx and the significant impact their Sx have had on their QOL. Pt would benefit from a biopsychosocial approach to yield  optimal outcomes. Plan to build interdisciplinary team with pt's providers to optimize patient-centered care.    Following Tx today which pt tolerated without complaints,  pt demo'd proper body mechanics to minimize straining pelvic floor.  Provided SIJ belt pro bono which pt was educated on don/doff for pelvic support. Pt reported improved  pelvic pain wearing belt.  Plan to address realignment of spine/ pelvis at next session to help promote optimize IAP system for improved pelvic floor function, trunk stability, gait, balance, stabilization with mobility tasks.  Plan to address pelvic floor issues once pelvis and spine are realigned to yield better outcomes.    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  Pt plans to return to this job 9 weeks after delivery. Pt will benefit from post partum rehab as well  Pt is in separation with father of her child.  She stated she felt unsafe and he had made threats but not taken any harmful physical actions towards patients. Pt lives near her sister and parents.    Pt benefits from skilled PT.       Session was abbreviated due to pt's late arrival.     OBJECTIVE IMPAIRMENTS decreased activity tolerance, decreased coordination, decreased endurance, decreased mobility, difficulty walking, decreased ROM, decreased strength, decreased safety awareness, hypomobility, increased muscle spasms, impaired flexibility, improper body mechanics, postural dysfunction, and pain. scar restrictions   ACTIVITY LIMITATIONS  self-care,  sleep, home chores, work tasks    PARTICIPATION LIMITATIONS:  community, work  PERSONAL FACTORS        are also affecting patient's functional outcome.    REHAB POTENTIAL: Good   CLINICAL DECISION MAKING: Evolving/moderate complexity   EVALUATION COMPLEXITY: Moderate    PATIENT EDUCATION:    Education details: Showed pt anatomy images. Explained muscles attachments/ connection, physiology of deep core system/ spinal- thoracic-pelvis-lower kinetic chain as they relate to pt's presentation, Sx, and past Hx. Explained what and how these areas of deficits need to be restored to balance and function    See Therapeutic activity / neuromuscular re-education section  Answered  pt's questions.   Person educated: Patient Education method: Explanation, Demonstration, Tactile cues, Verbal cues, and Handouts Education comprehension: verbalized understanding, returned demonstration, verbal cues required, tactile cues required, and needs further education     PLAN: PT FREQUENCY: 2x/week   PT DURATION: 10 weeks   PLANNED INTERVENTIONS: Therapeutic exercises, Therapeutic activity, Neuromuscular re-education, Balance training, Gait training, Patient/Family education, Self Care, Joint mobilization, Spinal mobilization, Moist heat, Taping, and Manual therapy, dry needling.   PLAN FOR NEXT SESSION: See clinical impression for plan     GOALS: Goals reviewed with patient? Yes  SHORT TERM GOALS: Target date: 11/18/2023    Pt will demo IND with HEP                    Baseline: Not IND            Goal status: INITIAL   LONG TERM GOALS: Target date:  12/30/2023      1.Pt will demo proper deep core coordination without chest breathing and optimal excursion of diaphragm/pelvic floor in order to promote spinal stability and pelvic floor function  Baseline: dyscoordination Goal status: INITIAL  2.  Pt will demo proper body mechanics in against gravity tasks and ADLs  work tasks, fitness  to minimize straining pelvic floor / back  Baseline: not IND, improper form that places strain on pelvic floor  Goal status: INITIAL    3. Pt will demo increased gait speed > 1.3 m/s with reciprocal gait pattern, longer stride length  in order to ambulate safely in community and return to fitness routine  Baseline: plan to assess Goal status: INITIAL    4. Pt demo proper coordination for quick kegels 3 reps to minimize SUI and promote pelvic girdle stability Baseline:  leakage with coughing, sneezing, brushing teeth  which started a month ago  Goal status: INITIAL   5. Pt will report decreased  radiating LBP down R back of leg by > 50% and report no pain when lying down   Baseline: low back pain radiating-down R back of the leg to her knee  -    8/10 pain hurts real bad without activities. She felt in laying down and it starts hurting  Goal status: INITIAL  6  Pt will demo levelled pelvic girdle and shoulder height in order to progress to deep core strengthening HEP and restore mobility at spine, pelvis, gait, posture minimize falls, and improve balance  and report decreased pelvic pain < 4/10  with bed mobility, sit to stand  Baseline: R shoulder lower, L iliac and reports -pelvic pain  feels like "shocks" when getting up from sitting and from bed , eases with walking , occurring  8/10  pain with sitting, lying in bed , standing up  , pain with stairs 3/10 . Feels pressure.  Goal status: INITIAL  Mariane Masters, PT 10/21/2023, 9:10 AM

## 2023-10-23 ENCOUNTER — Ambulatory Visit: Admitting: Physical Therapy

## 2023-10-23 DIAGNOSIS — R2689 Other abnormalities of gait and mobility: Secondary | ICD-10-CM

## 2023-10-23 DIAGNOSIS — M5459 Other low back pain: Secondary | ICD-10-CM

## 2023-10-23 DIAGNOSIS — M533 Sacrococcygeal disorders, not elsewhere classified: Secondary | ICD-10-CM | POA: Diagnosis not present

## 2023-10-23 NOTE — Therapy (Addendum)
 OUTPATIENT PHYSICAL THERAPY TREATMENT    Patient Name: Daisy Douglas MRN: [id number] DOB:Oct 23, 1995, 28 y.o., female Today's Date: 10/23/2023   PT End of Session - 10/23/23 1349     Visit Number 2    Number of Visits 10    Date for PT Re-Evaluation 12/30/23    PT Start Time 1342    PT Stop Time 1415    PT Time Calculation (min) 33 min    Activity Tolerance Patient tolerated treatment well;No increased pain    Behavior During Therapy [facility] for tasks assessed/performed             Past Medical History:  Diagnosis Date   Dysmenorrhea    Gestational diabetes    Pre-diabetes    Type O blood, Rh negative 01/2021   Vitamin D deficiency    Past Surgical History:  Procedure Laterality Date   NO PAST SURGERIES     Patient Active Problem List   Diagnosis Date Noted   Uterine prolapse 05/24/2023   Supervision of other normal pregnancy, antepartum 04/10/2023   Uterine contractions 10/19/2021   Pregnancy 10/02/2021   Pregnancy complicated by obesity, third trimester 07/10/2021   Anxiety 01/20/2019   Vitamin D deficiency 01/20/2019    PCP:   REFERRING PROVIDER: Valentino Saxon   REFERRING DIAG: pelvic and low back pain affecting pregnancy in third trimester, antepartum   Rationale for Evaluation and Treatment Rehabilitation  THERAPY DIAG:  Sacrococcygeal disorders, not elsewhere classified  Other abnormalities of gait and mobility  Other low back pain  ONSET DATE:   Due date for 2nd child 12/05/23   SUBJECTIVE:                           SUBJECTIVE STATEMENT TODAY:  Pt practiced picking up her feet and it has been helping pelvic pain. Pt felt pain improved by 20%.  Pt was confused about the SIJ belt   SUBJECTIVE STATEMENT ON 10/21/23 EVAL :  Pt is pregnant with her 2nd child in her third trimester. 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 Pt plans to return to this job 9 weeks after delivery. Pt is in  separation with father of her child.  She stated she felt unsafe and he had made threats but not taken any harmful physical actions towards patients. Pt lives near her sister and parents.    1) pelvic pain  - feels like "shocks" when getting up from sitting and from bed , eases with walking , occurring  8/10  pain with sitting, lying in bed , standing up  , pain with stairs 3/10 . Feels pressure.    2) low back pain radiating-down R back of the leg to her knee  -    8/10 pain hurts real bad without activities. She felt in laying down and it starts hurting   3) leakage with coughing, sneezing, brushing teeth  which started a month ago    PERTINENT HISTORY:  First pregnancy with vaginal delivery.    PAIN:  Are you having pain? Yes: see above   PRECAUTIONS: pregnancy   WEIGHT BEARING RESTRICTIONS: No   FALLS:  Has patient fallen in last 6 months? No   LIVING ENVIRONMENT: Lives with: Pt is in separation with father of her child.  She stated she felt unsafe and he had made threats but not taken any harmful physical actions towards patients.  Pt lives near her sister and parents.   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 Stopped working 3 weeks ago. Pt plans to return to this job 9 weeks after delivery  PLOF:   PATIENT GOALS:  Improve pain and decrease leakage    OBJECTIVE:    [facility] PT Assessment - 10/23/23 1351       Observation/Other Assessments   Observations hypomobile C/T , forward head      Palpation   Spinal mobility tightness along throacic / paraspinals  R SIJ hypomobility    SI assessment  levelled pelvis and shoulders      Bed Mobility   Bed Mobility --   poor carry over with logrolling, scooting in bed            [facility] Adult PT Treatment/Exercise - 10/23/23 1351       Ambulation/Gait   Gait Comments  picking up her feet      Self-Care   Other Self-Care Comments  provided instructions for SIJ belt don and doff      Neuro Re-ed    Neuro Re-ed Details  cued for thoracic mobility to optimize diaphragm excursions to improve spine/ pelvic stability, cued for bed mobility      Modalities   Modalities Moist Heat      Moist Heat Therapy   Number Minutes Moist Heat 5 Minutes (unbilled)   Moist Heat Location --   thoracic     Manual Therapy   Manual therapy comments STM/MWM in semi reclined position, along paraspinals to improve diaphragm excursion               HOME EXERCISE PROGRAM: See pt instruction section    ASSESSMENT:  CLINICAL IMPRESSION:                                                       Pt showed levelled pelvic girdle alignment.   Addressed thoracic hypomobility to help improve forward head posture. Also address SIJ mobility on R with manual Tx modified for pregnancy. Tx performed in semi-reclined position.  Pt demo'd improved mobility in these areas and will be ready for deep core training at next session. Cued for thoracic mobility to optimize diaphragm excursions to improve spine/ pelvic stability, cued for bed mobility  Instructed on how to don/doff SIJ again because pt was confused about how to put it on.   Regional interdependent approaches will yield greater benefits in pt's POC due to the complexity of pt's medical Hx and the significant impact their Sx have had on their QOL. Pt would benefit from a biopsychosocial approach to yield optimal outcomes. Plan to build interdisciplinary team with pt's providers to optimize patient-centered care.     Pt benefits from skilled PT.          OBJECTIVE IMPAIRMENTS decreased activity tolerance, decreased coordination, decreased endurance, decreased mobility, difficulty walking, decreased ROM, decreased strength, decreased safety awareness, hypomobility, increased muscle spasms, impaired flexibility, improper body  mechanics, postural dysfunction, and pain. scar restrictions   ACTIVITY LIMITATIONS  self-care,  sleep, home chores, work tasks    PARTICIPATION LIMITATIONS:  community, work  PERSONAL FACTORS        are also affecting patient's functional outcome.    REHAB POTENTIAL: Good   CLINICAL DECISION MAKING: Evolving/moderate complexity   EVALUATION COMPLEXITY: Moderate    PATIENT EDUCATION:    Education details: Showed pt anatomy images. Explained muscles attachments/ connection, physiology of deep core system/ spinal- thoracic-pelvis-lower kinetic chain as they relate to pt's presentation, Sx, and past Hx. Explained what and how these areas of deficits need to be restored to balance and function    See Therapeutic activity / neuromuscular re-education section  Answered pt's questions.   Douglas educated: Patient Education method: Explanation, Demonstration, Tactile cues, Verbal cues, and Handouts Education comprehension: verbalized understanding, returned demonstration, verbal cues required, tactile cues required, and needs further education     PLAN: PT FREQUENCY: 2x/week   PT DURATION: 10 weeks   PLANNED INTERVENTIONS: Therapeutic exercises, Therapeutic activity, Neuromuscular re-education, Balance training, Gait training, Patient/Family education, Self Care, Joint mobilization, Spinal mobilization, Moist heat, Taping, and Manual therapy, dry needling.   PLAN FOR NEXT SESSION: See clinical impression for plan     GOALS: Goals reviewed with patient? Yes  SHORT TERM GOALS: Target date: 11/18/2023    Pt will demo IND with HEP                    Baseline: Not IND            Goal status: INITIAL   LONG TERM GOALS: Target date:  12/30/2023      1.Pt will demo proper deep core coordination without chest breathing and optimal excursion of diaphragm/pelvic floor in order to promote spinal stability and pelvic floor function  Baseline: dyscoordination Goal status:  INITIAL  2.  Pt will demo proper body mechanics in against gravity tasks and ADLs  work tasks, fitness  to minimize straining pelvic floor / back    Baseline: not IND, improper form that places strain on pelvic floor  Goal status: INITIAL    3. Pt will demo increased gait speed > 1.3 m/s with reciprocal gait pattern, longer stride length  in order to ambulate safely in community and return to fitness routine  Baseline: plan to assess Goal status: INITIAL    4. Pt demo proper coordination for quick kegels 3 reps to minimize SUI and promote pelvic girdle stability Baseline:  leakage with coughing, sneezing, brushing teeth  which started a month ago  Goal status: INITIAL   5. Pt will report decreased  radiating LBP down R back of leg by > 50% and report no pain when lying down  Baseline: low back pain radiating-down R back of the leg to her knee  -    8/10 pain hurts real bad without activities. She felt in laying down and it starts hurting  Goal status: INITIAL  6  Pt will demo levelled pelvic girdle and shoulder height in order to progress to deep core strengthening HEP and restore mobility at spine, pelvis, gait, posture minimize falls, and improve balance  and report decreased pelvic pain < 4/10  with bed mobility, sit to stand  Baseline: R shoulder lower, L iliac and reports -pelvic pain  feels like "shocks" when getting up from sitting and from bed , eases with walking , occurring  8/10  pain with sitting, lying in bed , standing up  , pain with stairs 3/10 . Feels pressure.  Goal status: INITIAL  Mariane Masters, PT 10/23/2023, 1:51 PM

## 2023-10-23 NOTE — Patient Instructions (Signed)
  Lengthen Back rib by L  shoulder ( winging)    Lie on R  side , pillow between knees and under head  Pull  L arm overhead over mattress, grab the edge of mattress,pull it upward, drawing elbow away from ears  Breathing 10 reps  Brushing arm with 3/4 turn onto pillow behind back  Lying on R  side ,Pillow/ Block between knees     dragging top forearm across ribs below breast rotating 3/4 turn,  rotating  _L_ only this week ,  relax onto the pillow behind the back  and then back to other palm , maintain top palm on body whole top and not lift shoulder  Do this side this week       Wait do both sides until we have levelled out your spine and shoulders ___   Lengthen Back rib by R  shoulder   ( winging)    Lie on L  side , pillow between knees and under head  Pull  R arm overhead over mattress, grab the edge of mattress,pull it upward, drawing elbow away from ears  Breathing 10 reps  Brushing arm with 3/4 turn onto pillow behind back  Lying on L  side ,Pillow/ Block between knees     dragging top forearm across ribs below breast rotating 3/4 turn,  rotating  _R_ only this week ,  relax onto the pillow behind the back  and then back to other palm , maintain top palm on body whole top and not lift shoulder  Do this side this week       Wait do both sides until we have levelled out your spine and shoulders

## 2023-10-30 ENCOUNTER — Telehealth: Payer: Self-pay | Admitting: Certified Nurse Midwife

## 2023-10-30 ENCOUNTER — Ambulatory Visit: Admitting: Physical Therapy

## 2023-10-30 ENCOUNTER — Encounter: Payer: Managed Care, Other (non HMO) | Admitting: Certified Nurse Midwife

## 2023-10-30 NOTE — Telephone Encounter (Signed)
 Reached out to pt to reschedule ROB appt that was scheduled on 10/30/2023 at 10:35 with E. Slaughterbeck.  Was able to reschedule the appt for 10/31/2023 at 9:35 with J. Keitha Butte.

## 2023-10-30 NOTE — Telephone Encounter (Signed)
 TRIAGE VOICEMAIL: Patient has a question about paperwork she needs to file with [place] regarding maternity leave.

## 2023-10-30 NOTE — Telephone Encounter (Signed)
 Spoke with patient. Questions answered. She will bring forms with her to her appointment tomorrow.

## 2023-10-31 ENCOUNTER — Ambulatory Visit (INDEPENDENT_AMBULATORY_CARE_PROVIDER_SITE_OTHER): Admitting: Certified Nurse Midwife

## 2023-10-31 ENCOUNTER — Encounter: Payer: Managed Care, Other (non HMO) | Admitting: Certified Nurse Midwife

## 2023-10-31 VITALS — BP 120/74 | HR 77 | Wt 232.0 lb

## 2023-10-31 DIAGNOSIS — Z3483 Encounter for supervision of other normal pregnancy, third trimester: Secondary | ICD-10-CM | POA: Diagnosis not present

## 2023-10-31 DIAGNOSIS — Z3A35 35 weeks gestation of pregnancy: Secondary | ICD-10-CM

## 2023-10-31 DIAGNOSIS — O99019 Anemia complicating pregnancy, unspecified trimester: Secondary | ICD-10-CM

## 2023-10-31 DIAGNOSIS — Z0289 Encounter for other administrative examinations: Secondary | ICD-10-CM

## 2023-10-31 DIAGNOSIS — Z3480 Encounter for supervision of other normal pregnancy, unspecified trimester: Secondary | ICD-10-CM

## 2023-10-31 NOTE — Assessment & Plan Note (Addendum)
 Reviewed kick counts and preterm labor warning signs. Instructed to call office or come to hospital with persistent headache, vision changes, regular contractions, leaking of fluid, decreased fetal movement or vaginal bleeding. Anticipatory guidance for GBS/GC swabs at next visit reviewed.

## 2023-10-31 NOTE — Patient Instructions (Signed)
 Third Trimester of Pregnancy  The third trimester of pregnancy is from week 28 through week 40. This is months 7 through 9. The third trimester is a time when your baby is growing fast. Body changes during your third trimester Your body continues to change during this time. The changes usually go away after your baby is born. Physical changes You will continue to gain weight. You may get stretch marks on your hips, belly, and breasts. Your breasts will keep growing and may hurt. A yellow fluid (colostrum) may leak from your breasts. This is the first milk you're making for your baby. Your hair may grow faster and get thicker. In some cases, you may get hair loss. Your belly button may stick out. You may have more swelling in your hands, face, or ankles. Health changes You may have heartburn. You may feel short of breath. This is caused by the uterus that is now bigger. You may have more aches in the pelvis, back, or thighs. You may have more tingling or numbness in your hands, arms, and legs. You may pee more often. You may have trouble pooping (constipation) or swollen veins in the butt that can itch or get painful (hemorrhoids). Other changes You may have more problems sleeping. You may notice the baby moving lower in your belly (dropping). You may have more fluid coming from your vagina. Your joints may feel loose, and you may have pain around your pelvic bone. Follow these instructions at home: Medicines Take medicines only as told by your health care provider. Some medicines are not safe during pregnancy. Your provider may change the medicines that you take. Do not take any medicines unless told to by your provider. Take a prenatal vitamin that has at least 600 micrograms (mcg) of folic acid. Do not use herbal medicines, illegal drugs, or medicines that are not approved by your provider. Eating and drinking While you're pregnant your body needs additional nutrition to help  support your growing baby. Talk with your provider about your nutritional needs. Activity Most women are able to exercise regularly during pregnancy. Exercise routines may need to change at the end of your pregnancy. Talk to your provider about your activities and exercise routine. Relieving pain and discomfort Rest often with your legs raised if you have leg cramps or low back pain. Take warm sitz baths to soothe pain from hemorrhoids. Use hemorrhoid cream if your provider says it's okay. Wear a good, supportive bra if your breasts hurt. Do not use hot tubs, steam rooms, or saunas. Do not douche. Do not use tampons or scented pads. Safety Talk to your provider before traveling far distances. Wear your seatbelt at all times when you're in a car. Talk to your provider if someone hits you, hurts you, or yells at you. Preparing for birth To prepare for your baby: Take childbirth and breastfeeding classes. Visit the hospital and tour the maternity area. Buy a rear-facing car seat. Learn how to install it in your car. General instructions Avoid cat litter boxes and soil used by cats. These things carry germs that can cause harm to your pregnancy and your baby. Do not drink alcohol, smoke, vape, or use products with nicotine or tobacco in them. If you need help quitting, talk with your provider. Keep all follow-up visits for your third trimester. Your provider will do more exams and tests during this trimester. Write down your questions. Take them to your prenatal visits. Your provider also will: Talk with you about  your overall health. Give you advice or refer you to specialists who can help with different needs, including: Mental health and counseling. Foods and healthy eating. Ask for help if you need help with food. Where to find more information American Pregnancy Association: americanpregnancy.org [employer]necologists: acog.org Office on [employer] Health:  [employer] Contact a health care provider if: You have a headache that does not go away when you take medicine. You have any of these problems: You can't eat or drink. You have nausea and vomiting. You have watery poop (diarrhea) for 2 days or more. You have pain when you pee, or your pee smells bad. You have been sick for 2 days or more and aren't getting better. Contact your provider right away if: You have any of these coming from your vagina: Abnormal discharge. Bad-smelling fluid. Bleeding. Your baby is moving less than usual. You have signs of labor: You have any contractions, belly cramping, or have pain in your pelvis or lower back before 37 weeks of pregnancy (preterm labor). You have regular contractions that are less than 5 minutes apart. Your water breaks. You have symptoms of high blood pressure or preeclampsia. These include: A severe, throbbing headache that does not go away. Sudden or extreme swelling of your face, hands, legs, or feet. Vision problems: You see spots. You have blurry vision. Your eyes are sensitive to light. If you can't reach your provider, go to an urgent care or emergency room. Get help right away if: You faint, become confused, or can't think clearly. You have chest pain or trouble breathing. You have any kind of injury, such as from a fall or a car crash. These symptoms may be an emergency. Call 911 right away. Do not wait to see if the symptoms will go away. Do not drive yourself to the hospital. This information is not intended to replace advice given to you by your health care provider. Make sure you discuss any questions you have with your health care provider. Document Revised: 05/09/2023 Document Reviewed: 12/07/2022 Elsevier Patient Education  2024 Elsevier Inc. Group B Streptococcus Infection During Pregnancy Group B Streptococcus (GBS) is a type of bacteria that is often found in healthy people. It is commonly found in the  rectum, vagina, and intestines. In people who are healthy and not pregnant, the bacteria rarely cause serious illness or complications. However, women who test positive for GBS during pregnancy can pass the bacteria to the baby during childbirth. This can cause serious infection in the baby after birth. Women with GBS may also have infections during their pregnancy or soon after childbirth. The infections include urinary tract infections (UTIs) or infections of the uterus. GBS also increases a woman's risk of complications during pregnancy, such as early labor or delivery, miscarriage, or stillbirth. Routine testing for GBS is recommended for all pregnant women. What are the causes? This condition is caused by bacteria called Streptococcus agalactiae. What increases the risk? You may have a higher risk for GBS infection during pregnancy if you had one during a past pregnancy. What are the signs or symptoms? In most cases, GBS infection does not cause symptoms in pregnant women. If symptoms exist, they may include: Labor that starts before the 37th week of pregnancy. A UTI or bladder infection. This may cause a fever, frequent urination, or pain and burning during urination. Fever during labor. There can also be a rapid heartbeat in the mother or baby. Rare but serious symptoms of a GBS  infection in women include: Blood infection (septicemia). This may cause fever, chills, or confusion. Lung infection (pneumonia). This may cause fever, chills, cough, rapid breathing, chest pain, or difficulty breathing. Bone, joint, skin, or soft tissue infection. How is this diagnosed? You may be screened for GBS between week 35 and week 37 of pregnancy. If you have symptoms of preterm labor, you may be screened earlier. This condition is diagnosed based on lab test results from: A swab of fluid from the vagina and rectum. A urine sample. How is this treated? This condition is treated with antibiotic medicine.  Antibiotic medicine may be given: To you when you go into labor, or as soon as your water breaks. The medicines will continue until after you give birth. If you are having a cesarean delivery, you do not need antibiotics unless your water has broken. To your baby, if he or she requires treatment. Your health care provider will check your baby to decide if he or she needs antibiotics to prevent a serious infection. Follow these instructions at home: Take over-the-counter and prescription medicines only as told by your health care provider. Take your antibiotic medicine as told by your health care provider. Do not stop taking the antibiotic even if you start to feel better. Keep all pre-birth (prenatal) visits and follow-up visits as told by your health care provider. This is important. Contact a health care provider if: You have pain or burning when you urinate. You have to urinate more often than usual. You have a fever or chills. You develop a bad-smelling vaginal discharge. Get help right away if: Your water breaks. You go into labor. You have severe pain in your abdomen. You have difficulty breathing. You have chest pain. These symptoms may represent a serious problem that is an emergency. Do not wait to see if the symptoms will go away. Get medical help right away. Call your local emergency services (911 in the [place] Do not drive yourself to the hospital. Summary GBS is a type of bacteria that is common in healthy people. During pregnancy, colonization with GBS can cause serious complications for you or your baby. Your health care provider will screen you between 35 and 37 weeks of pregnancy to determine if you are colonized with GBS. If you are colonized with GBS during pregnancy, your health care provider will recommend antibiotics through an IV during labor. After delivery, your baby will be evaluated for complications related to potential GBS infection and may require antibiotics to  prevent a serious infection. This information is not intended to replace advice given to you by your health care provider. Make sure you discuss any questions you have with your health care provider. Document Revised: 07/23/2022 Document Reviewed: 07/23/2022 Elsevier Patient Education  2024 [employer].

## 2023-10-31 NOTE — Progress Notes (Signed)
    Return Prenatal Note   Subjective   28 y.o. G2P1001 at [redacted]w[redacted]d presents for this follow-up prenatal visit.  Patient feeling continued pelvic pain & pressure with lightening crotch more often. Has started pelvic floor PT. Patient reports: Movement: Present Contractions: Irritability  Objective   Flow sheet Vitals: Pulse Rate: 77 BP: 120/74 Fundal Height: 35 cm Fetal Heart Rate (bpm): 140 Total weight gain: 22 lb (9.979 kg)  General Appearance  No acute distress, well appearing, and well nourished Pulmonary   Normal work of breathing Neurologic   Alert and oriented to person, place, and time Psychiatric   Mood and affect within normal limits  Assessment/Plan   Plan  28 y.o. G2P1001 at [redacted]w[redacted]d presents for follow-up OB visit. Reviewed prenatal record including previous visit note.  Supervision of other normal pregnancy, antepartum Reviewed kick counts and preterm labor warning signs. Instructed to call office or come to hospital with persistent headache, vision changes, regular contractions, leaking of fluid, decreased fetal movement or vaginal bleeding. Anticipatory guidance for GBS/GC swabs at next visit reviewed.      Orders Placed This Encounter  Procedures   CBC With Diff/Platelet   Fe+TIBC+Fer   Return in 1 week (on 11/07/2023) for ROB.   Future Appointments  Date Time Provider Department Center  11/13/2023  9:30 AM Mariane Masters, PT [facility] None  11/13/2023 11:15 AM [facility] [place] [facility] None  11/13/2023  1:55 PM Julieanne Manson, MD [facility] None  11/27/2023  9:30 AM Mariane Masters, PT [facility] None    For next visit:  continue with routine prenatal care     Dominica Severin, CNM  03/13/255:21 PM

## 2023-11-01 ENCOUNTER — Encounter: Payer: Self-pay | Admitting: Certified Nurse Midwife

## 2023-11-01 LAB — CBC WITH DIFF/PLATELET
Basophils Absolute: 0 10*3/uL (ref 0.0–0.2)
Basos: 0 %
EOS (ABSOLUTE): 0 10*3/uL (ref 0.0–0.4)
Eos: 1 %
Hematocrit: 31.2 % — ABNORMAL LOW (ref 34.0–46.6)
Hemoglobin: 10.1 g/dL — ABNORMAL LOW (ref 11.1–15.9)
Immature Grans (Abs): 0 10*3/uL (ref 0.0–0.1)
Immature Granulocytes: 1 %
Lymphocytes Absolute: 2.3 10*3/uL (ref 0.7–3.1)
Lymphs: 29 %
MCH: 25.2 pg — ABNORMAL LOW (ref 26.6–33.0)
MCHC: 32.4 g/dL (ref 31.5–35.7)
MCV: 78 fL — ABNORMAL LOW (ref 79–97)
Monocytes Absolute: 0.6 10*3/uL (ref 0.1–0.9)
Monocytes: 8 %
Neutrophils Absolute: 4.8 10*3/uL (ref 1.4–7.0)
Neutrophils: 61 %
Platelets: 229 10*3/uL (ref 150–450)
RBC: 4.01 x10E6/uL (ref 3.77–5.28)
RDW: 14.2 % (ref 11.7–15.4)
WBC: 7.7 10*3/uL (ref 3.4–10.8)

## 2023-11-01 LAB — IRON,TIBC AND FERRITIN PANEL
Ferritin: 9 ng/mL — ABNORMAL LOW (ref 15–150)
Iron Saturation: 38 % (ref 15–55)
Iron: 206 ug/dL — ABNORMAL HIGH (ref 27–159)
Total Iron Binding Capacity: 545 ug/dL — ABNORMAL HIGH (ref 250–450)
UIBC: 339 ug/dL (ref 131–425)

## 2023-11-05 ENCOUNTER — Ambulatory Visit: Admitting: Physical Therapy

## 2023-11-13 ENCOUNTER — Other Ambulatory Visit (HOSPITAL_COMMUNITY)
Admission: RE | Admit: 2023-11-13 | Discharge: 2023-11-13 | Disposition: A | Discharge status: Home / Self Care | Source: Ambulatory Visit | Attending: Obstetrics | Admitting: Obstetrics

## 2023-11-13 ENCOUNTER — Ambulatory Visit: Payer: Managed Care, Other (non HMO)

## 2023-11-13 ENCOUNTER — Other Ambulatory Visit: Payer: Self-pay | Admitting: Obstetrics

## 2023-11-13 ENCOUNTER — Ambulatory Visit (INDEPENDENT_AMBULATORY_CARE_PROVIDER_SITE_OTHER): Payer: Managed Care, Other (non HMO) | Admitting: Obstetrics

## 2023-11-13 ENCOUNTER — Ambulatory Visit: Admitting: Physical Therapy

## 2023-11-13 VITALS — BP 108/68 | HR 89 | Wt 231.0 lb

## 2023-11-13 DIAGNOSIS — O99213 Obesity complicating pregnancy, third trimester: Secondary | ICD-10-CM

## 2023-11-13 DIAGNOSIS — O0993 Supervision of high risk pregnancy, unspecified, third trimester: Secondary | ICD-10-CM

## 2023-11-13 DIAGNOSIS — Z3A36 36 weeks gestation of pregnancy: Secondary | ICD-10-CM

## 2023-11-13 DIAGNOSIS — E669 Obesity, unspecified: Secondary | ICD-10-CM

## 2023-11-13 DIAGNOSIS — M5459 Other low back pain: Secondary | ICD-10-CM

## 2023-11-13 DIAGNOSIS — O365930 Maternal care for other known or suspected poor fetal growth, third trimester, not applicable or unspecified: Secondary | ICD-10-CM | POA: Diagnosis not present

## 2023-11-13 DIAGNOSIS — Z113 Encounter for screening for infections with a predominantly sexual mode of transmission: Secondary | ICD-10-CM

## 2023-11-13 DIAGNOSIS — O365990 Maternal care for other known or suspected poor fetal growth, unspecified trimester, not applicable or unspecified: Secondary | ICD-10-CM

## 2023-11-13 DIAGNOSIS — M533 Sacrococcygeal disorders, not elsewhere classified: Secondary | ICD-10-CM

## 2023-11-13 DIAGNOSIS — O99210 Obesity complicating pregnancy, unspecified trimester: Secondary | ICD-10-CM

## 2023-11-13 DIAGNOSIS — R2689 Other abnormalities of gait and mobility: Secondary | ICD-10-CM

## 2023-11-13 DIAGNOSIS — Z3685 Encounter for antenatal screening for Streptococcus B: Secondary | ICD-10-CM

## 2023-11-13 NOTE — Progress Notes (Signed)
    Return Prenatal Note   Subjective  28 y.o. G2P1001 at [redacted]w[redacted]d presents for this follow-up prenatal visit. Pregnancy notable for hx of GDM, Rh negative, varicella non-immune, BMI, and new onset FGR.   Patient has no complaints today. Growth [place] today for BMI with new onset FGR.   Patient reports: Movement: Present Contractions: Not present Denies vaginal bleeding or leaking fluid. Objective  Flow sheet Vitals: Pulse Rate: 89 BP: 108/68 Fundal Height: 37 cm Fetal Heart Rate (bpm): 135 Total weight gain: 21 lb (9.526 kg)  General Appearance  No acute distress, well appearing, and well nourished Pulmonary   Normal work of breathing Neurologic   Alert and oriented to person, place, and time Psychiatric   Mood and affect within normal limits   PRELIM ULTRASOUND REPORT   Location: [facility] Date of Service: 11/13/2023    Indications: maternal obesity in third trimester, IUGR Findings:  Singleton intrauterine pregnancy is visualized with FHR at 144 bpm.    Biometrics gives an (U/S) Gestational age of [redacted]w[redacted]d and an (U/S) EDD of 12/19/2023; this correlates with the clinically established Estimated Date of Delivery: 12/05/23.    Fetal presentation is Cephalic.  Placenta: posterior.  Normal AFI: 15.3 cm, MVP 5.0 cm   Growth is in the  9th percentile   AC is in the 1.5 percentile. EFW: 2482g, 5lb 8oz   BPP Scoring: Movement: 2/2  Tone: 2/2  Breathing: 2/2  AFI: 2/2   UA Dopplers appear normal. There is no absence or reversal of diastolic blood flow. 95th percentile is 3.36.  S/D ratio is 2.60 and normal for this gestational age.     Impression: 1. [redacted]w[redacted]d Viable Single Intrauterine pregnancy dated by previously established criteria. 2. Growth is 9th percentile with AC measuring in the 1st percentile. Normal AFI is 15.3 cm.  3. BPP is 8/8 4. Umbilical dopplers were normal 5. New onset IUGR Assessment/Plan   Plan  28 y.o. G2P1001 at [redacted]w[redacted]d by 9wk [place]  presents for follow-up OB visit. Reviewed prenatal record including previous visit note.  1. Supervision of high risk pregnancy in third trimester (Primary) 2. Fetal growth restriction antepartum 3. Pregnancy complicated by obesity, third trimester  New onset FGR today on growth [place] for BMI. EFW 9%ile, AC 1%ile with BPP 8/8 and normal dopplers. Discussed with Dr. Grace Bushy, MFM, who recommends delivery in 38th week, sooner if indicated. Pt will have BPP/dopplers in 1 week. IOL scheduled for 4/7 at [redacted]w[redacted]d. GBS and GC/CT swabs completed today.   Orders Placed This Encounter  Procedures   Strep Gp B NAA   Return in about 1 week (around 11/20/2023) for ROB, BPP/dopplers.   Future Appointments  Date Time Provider Department Center  11/20/2023 11:00 AM [facility] [place] [facility] None  11/21/2023 10:35 AM Swanson, Adan Sis, CNM [facility] None   For next visit:  continue with routine prenatal care    Julieanne Manson, DO [facility]

## 2023-11-13 NOTE — Patient Instructions (Signed)

## 2023-11-13 NOTE — Therapy (Signed)
 OUTPATIENT PHYSICAL THERAPY Treatment/ Discharge Summary across 3 visits    Patient Name: Daisy Douglas MRN: [id number] DOB:05/05/1996, 28 y.o., female Today's Date: 11/13/2023   PT End of Session - 11/13/23 1435     Visit Number 3    Number of Visits 10    Date for PT Re-Evaluation 12/30/23    PT Start Time 0941    PT Stop Time 1015    PT Time Calculation (min) 34 min    Activity Tolerance Patient tolerated treatment well;No increased pain    Behavior During Therapy [facility] for tasks assessed/performed             Past Medical History:  Diagnosis Date   Dysmenorrhea    Gestational diabetes    Pre-diabetes    Type O blood, Rh negative 01/2021   Vitamin D deficiency    Past Surgical History:  Procedure Laterality Date   NO PAST SURGERIES     Patient Active Problem List   Diagnosis Date Noted   Uterine prolapse 05/24/2023   Supervision of other normal pregnancy, antepartum 04/10/2023   Uterine contractions 10/19/2021   Pregnancy 10/02/2021   Pregnancy complicated by obesity, third trimester 07/10/2021   Anxiety 01/20/2019   Vitamin D deficiency 01/20/2019    PCP:   REFERRING PROVIDER: Valentino Saxon   REFERRING DIAG: pelvic and low back pain affecting pregnancy in third trimester, antepartum   Rationale for Evaluation and Treatment Rehabilitation  THERAPY DIAG:  Sacrococcygeal disorders, not elsewhere classified  Other abnormalities of gait and mobility  Other low back pain  ONSET DATE:   Due date for 2nd child 12/05/23   SUBJECTIVE:                           SUBJECTIVE STATEMENT TODAY:  Pt is excited to come to therapy. Pt has been doing her HEP   SUBJECTIVE STATEMENT ON 10/21/23 EVAL :  Pt is pregnant with her 2nd child in her third trimester. 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 Pt plans to return to this job 9 weeks after delivery. Pt is in separation with father of her child.  She  stated she felt unsafe and he had made threats but not taken any harmful physical actions towards patients. Pt lives near her sister and parents.    1) pelvic pain  - feels like "shocks" when getting up from sitting and from bed , eases with walking , occurring  8/10  pain with sitting, lying in bed , standing up  , pain with stairs 3/10 . Feels pressure.    2) low back pain radiating-down R back of the leg to her knee  -    8/10 pain hurts real bad without activities. She felt in laying down and it starts hurting   3) leakage with coughing, sneezing, brushing teeth  which started a month ago    PERTINENT HISTORY:  First pregnancy with vaginal delivery.    PAIN:  Are you having pain? Yes: see above   PRECAUTIONS: pregnancy   WEIGHT BEARING RESTRICTIONS: No   FALLS:  Has patient fallen in last 6 months? No   LIVING ENVIRONMENT: Lives with: Pt is in separation with father of her child.  She stated she felt unsafe and he had made threats but not taken any harmful physical actions towards patients. Pt lives near her sister and parents.  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 Stopped working 3 weeks ago. Pt plans to return to this job 9 weeks after delivery  PLOF:   PATIENT GOALS:  Improve pain and decrease leakage    OBJECTIVE:    [facility] PT Assessment - 11/13/23 1448       Observation/Other Assessments   Observations forward head, poor propception of feet, spine, head alignment in clam shell HEP             [facility] Adult PT Treatment/Exercise - 11/13/23 1444       Self-Care   Other Self-Care Comments  provided comfort and support for position,      Neuro Re-ed    Neuro Re-ed Details  cued for hip abduction B strengthening and stretches, seated: shoulder abd/ ER with red band, cued for cervico-scapular retraction                HOME EXERCISE PROGRAM: See pt instruction section    ASSESSMENT:  CLINICAL IMPRESSION:   Pt met 3/5 goals with resolved radiating LBP  and pelvic pain.                                                     Pt showed levelled pelvic girdle alignment with past Tx.        Pt progressed to hip abduction strengthening.                                        Pt has been compliant with HEP and wearing SIJ belt. Explained to return for post partum Pelvic PT.   Pt self-d/c today.                                              Session today was abbreviated due to late arrival.     OBJECTIVE IMPAIRMENTS decreased activity tolerance, decreased coordination, decreased endurance, decreased mobility, difficulty walking, decreased ROM, decreased strength, decreased safety awareness, hypomobility, increased muscle spasms, impaired flexibility, improper body mechanics, postural dysfunction, and pain. scar restrictions   ACTIVITY LIMITATIONS  self-care,  sleep, home chores, work tasks    PARTICIPATION LIMITATIONS:  community, work  PERSONAL FACTORS        are also affecting patient's functional outcome.    REHAB POTENTIAL: Good   CLINICAL DECISION MAKING: Evolving/moderate complexity   EVALUATION COMPLEXITY: Moderate    PATIENT EDUCATION:    Education details: Showed pt anatomy images. Explained muscles attachments/ connection, physiology of deep core system/ spinal- thoracic-pelvis-lower kinetic chain as they relate to pt's presentation, Sx, and past Hx. Explained what and how these areas of deficits need to be restored to balance and function    See Therapeutic activity / neuromuscular re-education section  Answered pt's questions.   Person educated: Patient Education method: Explanation, Demonstration, Tactile cues, Verbal cues, and Handouts Education comprehension: verbalized understanding, returned demonstration, verbal cues required, tactile cues required, and needs  further education  PLAN: PT FREQUENCY: 2x/week   PT DURATION: 10 weeks   PLANNED INTERVENTIONS: Therapeutic exercises, Therapeutic activity, Neuromuscular re-education, Balance training, Gait training, Patient/Family education, Self Care, Joint mobilization, Spinal mobilization, Moist heat, Taping, and Manual therapy, dry needling.   PLAN FOR NEXT SESSION: See clinical impression for plan     GOALS: Goals reviewed with patient? Yes  SHORT TERM GOALS: Target date: 11/18/2023    Pt will demo IND with HEP                    Baseline: Not IND            Goal status: INITIAL   LONG TERM GOALS: Target date:  12/30/2023      1.Pt will demo proper deep core coordination without chest breathing and optimal excursion of diaphragm/pelvic floor in order to promote spinal stability and pelvic floor function  Baseline: dyscoordination Goal status: Not met   2.  Pt will demo proper body mechanics in against gravity tasks and ADLs  work tasks, fitness  to minimize straining pelvic floor / back    Baseline: not IND, improper form that places strain on pelvic floor  Goal status: MET     3. Pt will demo increased gait speed > 1.3 m/s with reciprocal gait pattern, longer stride length  in order to ambulate safely in community and return to fitness routine  Baseline: plan to assess Goal status: Deferred     4. Pt demo proper coordination for quick kegels 3 reps to minimize SUI and promote pelvic girdle stability Baseline:  leakage with coughing, sneezing, brushing teeth  which started a month ago  Goal status: Not met   5. Pt will report decreased  radiating LBP down R back of leg by > 50% and report no pain when lying down  Baseline: low back pain radiating-down R back of the leg to her knee  -    8/10 pain hurts real bad without activities. She felt in laying down and it starts hurting  Goal status: MET   6  Pt will demo levelled pelvic girdle and shoulder height in order to  progress to deep core strengthening HEP and restore mobility at spine, pelvis, gait, posture minimize falls, and improve balance  and report decreased pelvic pain < 4/10  with bed mobility, sit to stand  Baseline: R shoulder lower, L iliac and reports -pelvic pain  feels like "shocks" when getting up from sitting and from bed , eases with walking , occurring  8/10  pain with sitting, lying in bed , standing up  , pain with stairs 3/10 . Feels pressure.  Goal status: MET   Mariane Masters, PT 11/13/2023, 2:50 PM

## 2023-11-13 NOTE — Patient Instructions (Signed)
   Clam Shell 45 Degrees  Lying with hips and knees bent 45, one pillow between knees and ankles. Heel together, toes apart like ballerina,  Lift knee with exhale while pressing heels together. Be sure pelvis does not roll backward. Do not arch back. Do 20 times, each leg, 2 times per day.    Complimentary stretch:    Sit at 45 deg turn with R leg and knee on edge of chair/ bench, L buttock hanging off the edge to bring the L foot back like a lunge, toes bent, lower heel to feel quad stretch,  pay attention to keeping pinky and first toe ballmound planted to align toes forward so ankles are not twerked   Repeat with other side   ___  Seated  Red band pull, elbows by side,   Feet firm   Chin tuck,  20 reps on exhale

## 2023-11-14 ENCOUNTER — Encounter: Payer: Self-pay | Admitting: Obstetrics

## 2023-11-14 ENCOUNTER — Other Ambulatory Visit: Payer: Self-pay | Admitting: Obstetrics

## 2023-11-14 DIAGNOSIS — O98313 Other infections with a predominantly sexual mode of transmission complicating pregnancy, third trimester: Secondary | ICD-10-CM

## 2023-11-14 LAB — CERVICOVAGINAL ANCILLARY ONLY
Chlamydia: POSITIVE — AB
Comment: NEGATIVE
Comment: NORMAL
Neisseria Gonorrhea: NEGATIVE

## 2023-11-14 MED ORDER — AZITHROMYCIN 500 MG PO TABS
1000.0000 mg | ORAL_TABLET | Freq: Once | ORAL | 0 refills | Status: AC
Start: 2023-11-14 — End: 2023-11-14

## 2023-11-14 NOTE — Progress Notes (Signed)
 Rx for Azithromycin 1g x 1 for +CT

## 2023-11-15 LAB — STREP GP B NAA: Strep Gp B NAA: NEGATIVE

## 2023-11-18 ENCOUNTER — Other Ambulatory Visit: Payer: Self-pay | Admitting: Obstetrics

## 2023-11-18 DIAGNOSIS — O365930 Maternal care for other known or suspected poor fetal growth, third trimester, not applicable or unspecified: Secondary | ICD-10-CM

## 2023-11-20 ENCOUNTER — Telehealth: Payer: Self-pay | Admitting: Obstetrics

## 2023-11-20 ENCOUNTER — Other Ambulatory Visit

## 2023-11-20 NOTE — Telephone Encounter (Signed)
 Reached out to pt about [place] (BPP/Dopplers) that was scheduled on 11/20/2023 at 11:00.  Did not reach pt, bc call could not be completed at this time.  Per JP pt will be worked in for BPP/Dopplers when pt comes for ROB appt on 11/21/2023 at 10:35 with Judie Petit. Chryl Heck.

## 2023-11-21 ENCOUNTER — Encounter: Admitting: Obstetrics

## 2023-11-21 ENCOUNTER — Telehealth: Payer: Self-pay | Admitting: Obstetrics

## 2023-11-21 DIAGNOSIS — Z3480 Encounter for supervision of other normal pregnancy, unspecified trimester: Secondary | ICD-10-CM

## 2023-11-21 DIAGNOSIS — O99213 Obesity complicating pregnancy, third trimester: Secondary | ICD-10-CM

## 2023-11-21 DIAGNOSIS — O365990 Maternal care for other known or suspected poor fetal growth, unspecified trimester, not applicable or unspecified: Secondary | ICD-10-CM

## 2023-11-21 NOTE — Telephone Encounter (Signed)
 Reached out to pt to reschedule ROB appt that was scheduled on 11/21/2023 at 10:35 with Quitman Livings.  Could not leave a message bc call could not be completed at this time.

## 2023-11-22 ENCOUNTER — Encounter: Payer: Self-pay | Admitting: Obstetrics

## 2023-11-22 NOTE — Telephone Encounter (Signed)
 Contacted the patient via phone, no answer, Call couldn't be completed at this time. My chart letter sent for missed appointment.

## 2023-11-23 ENCOUNTER — Encounter: Payer: Self-pay | Admitting: Obstetrics and Gynecology

## 2023-11-23 ENCOUNTER — Observation Stay
Admission: EM | Admit: 2023-11-23 | Discharge: 2023-11-23 | Disposition: A | Discharge status: Home / Self Care | Attending: Obstetrics and Gynecology | Admitting: Obstetrics and Gynecology

## 2023-11-23 ENCOUNTER — Telehealth: Payer: Self-pay | Admitting: Licensed Practical Nurse

## 2023-11-23 ENCOUNTER — Other Ambulatory Visit: Payer: Self-pay

## 2023-11-23 DIAGNOSIS — R197 Diarrhea, unspecified: Secondary | ICD-10-CM | POA: Insufficient documentation

## 2023-11-23 DIAGNOSIS — O26893 Other specified pregnancy related conditions, third trimester: Principal | ICD-10-CM | POA: Diagnosis present

## 2023-11-23 DIAGNOSIS — Y9301 Activity, walking, marching and hiking: Secondary | ICD-10-CM | POA: Insufficient documentation

## 2023-11-23 DIAGNOSIS — R109 Unspecified abdominal pain: Secondary | ICD-10-CM | POA: Insufficient documentation

## 2023-11-23 DIAGNOSIS — Z3A38 38 weeks gestation of pregnancy: Secondary | ICD-10-CM | POA: Insufficient documentation

## 2023-11-23 DIAGNOSIS — Z3480 Encounter for supervision of other normal pregnancy, unspecified trimester: Principal | ICD-10-CM

## 2023-11-23 NOTE — Telephone Encounter (Signed)
 TC to pt, pt called the answering service with questions.  PT was recently diagnosed with Chlamydia, her phone number recently changed so she did not find out until now. She is wondering if is ok to keep her scheduled on IOL on Monday. Per the Neo on call as long as she has taken her medication, she is ok to keep that scheduled IOL. Reviewed this with Keryn. Zyonna plans to pick up her medication today.  Questions answered regarding method of induction, reviewed she can expect to start with a ripening balloon and Cytotec followed by Pitocin, AROM when appropriate.  Pt verbalized understanding. Her new phone number is [phone] Carie Caddy, CNM  Domingo Pulse, [facility]  11/23/2023 10:52 AM
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
## 2023-11-23 NOTE — OB Triage Note (Signed)
 Patient is a 28 yo, G2P1, at 38 weeks 2 days. Patient presents with complaints of abdominal pain. Patient states she was walking around the shopping mall and began to notice come abdominal pain and pressure. Patient states she felt like she needed to have a large bowel movement and she did. Patient started her abx for treating newly diagnosed chlamydia today. Patient denies any vaginal bleeding or LOF. Patient reports +FM. Monitors applied and assessing. VSS. Initial fetal heart tone 165. CNM notified of patients arrival to unit. Plan to place in observation for NST.

## 2023-11-25 ENCOUNTER — Inpatient Hospital Stay
Admission: RE | Admit: 2023-11-25 | Discharge: 2023-11-27 | DRG: 807 | Disposition: A | Discharge status: Home / Self Care | Source: Ambulatory Visit | Attending: Obstetrics | Admitting: Obstetrics

## 2023-11-25 ENCOUNTER — Other Ambulatory Visit: Payer: Self-pay

## 2023-11-25 ENCOUNTER — Encounter: Payer: Self-pay | Admitting: Obstetrics

## 2023-11-25 ENCOUNTER — Inpatient Hospital Stay: Admitting: Registered Nurse

## 2023-11-25 DIAGNOSIS — Z3A38 38 weeks gestation of pregnancy: Secondary | ICD-10-CM | POA: Diagnosis not present

## 2023-11-25 DIAGNOSIS — O365930 Maternal care for other known or suspected poor fetal growth, third trimester, not applicable or unspecified: Principal | ICD-10-CM | POA: Diagnosis present

## 2023-11-25 DIAGNOSIS — K219 Gastro-esophageal reflux disease without esophagitis: Secondary | ICD-10-CM | POA: Diagnosis present

## 2023-11-25 DIAGNOSIS — O9962 Diseases of the digestive system complicating childbirth: Secondary | ICD-10-CM | POA: Diagnosis present

## 2023-11-25 DIAGNOSIS — O99214 Obesity complicating childbirth: Secondary | ICD-10-CM | POA: Diagnosis present

## 2023-11-25 DIAGNOSIS — O365990 Maternal care for other known or suspected poor fetal growth, unspecified trimester, not applicable or unspecified: Principal | ICD-10-CM | POA: Diagnosis present

## 2023-11-25 LAB — CBC
HCT: 29.3 % — ABNORMAL LOW (ref 36.0–46.0)
Hemoglobin: 9.5 g/dL — ABNORMAL LOW (ref 12.0–15.0)
MCH: 25.5 pg — ABNORMAL LOW (ref 26.0–34.0)
MCHC: 32.4 g/dL (ref 30.0–36.0)
MCV: 78.8 fL — ABNORMAL LOW (ref 80.0–100.0)
Platelets: 191 10*3/uL (ref 150–400)
RBC: 3.72 MIL/uL — ABNORMAL LOW (ref 3.87–5.11)
RDW: 16 % — ABNORMAL HIGH (ref 11.5–15.5)
WBC: 9.4 10*3/uL (ref 4.0–10.5)
nRBC: 0 % (ref 0.0–0.2)

## 2023-11-25 LAB — TYPE AND SCREEN
ABO/RH(D): O NEG
Antibody Screen: POSITIVE

## 2023-11-25 LAB — URINALYSIS, ROUTINE W REFLEX MICROSCOPIC
Bilirubin Urine: NEGATIVE
Glucose, UA: NEGATIVE mg/dL
Hgb urine dipstick: NEGATIVE
Ketones, ur: NEGATIVE mg/dL
Leukocytes,Ua: NEGATIVE
Nitrite: NEGATIVE
Protein, ur: NEGATIVE mg/dL
Specific Gravity, Urine: 1.014 (ref 1.005–1.030)
pH: 7 (ref 5.0–8.0)

## 2023-11-25 MED ORDER — HYDROXYZINE HCL 25 MG PO TABS: 50.0000 mg | ORAL_TABLET | Freq: Four times a day (QID) | ORAL | Status: DC | PRN

## 2023-11-25 MED ORDER — MISOPROSTOL 25 MCG QUARTER TABLET
25.0000 ug | ORAL_TABLET | Freq: Once | ORAL | Status: AC
  Administered 2023-11-25: 25 ug via ORAL
  Filled 2023-11-25: qty 1

## 2023-11-25 MED ORDER — ONDANSETRON HCL 4 MG/2ML IJ SOLN: 4.0000 mg | Freq: Four times a day (QID) | INTRAMUSCULAR | Status: DC | PRN

## 2023-11-25 MED ORDER — DIBUCAINE (PERIANAL) 1 % EX OINT: 1.0000 | TOPICAL_OINTMENT | CUTANEOUS | Status: DC | PRN

## 2023-11-25 MED ORDER — FENTANYL-BUPIVACAINE-NACL 0.5-0.125-0.9 MG/250ML-% EP SOLN
EPIDURAL | Status: AC
  Filled 2023-11-25: qty 250

## 2023-11-25 MED ORDER — BUPIVACAINE HCL (PF) 0.25 % IJ SOLN
INTRAMUSCULAR | Status: DC | PRN
  Administered 2023-11-25: 4 mL via EPIDURAL
  Administered 2023-11-25: 3 mL via EPIDURAL

## 2023-11-25 MED ORDER — OXYTOCIN-SODIUM CHLORIDE 30-0.9 UT/500ML-% IV SOLN
2.5000 [IU]/h | INTRAVENOUS | Status: DC
  Administered 2023-11-25: 2.5 [IU]/h via INTRAVENOUS

## 2023-11-25 MED ORDER — LIDOCAINE-EPINEPHRINE (PF) 1.5 %-1:200000 IJ SOLN
INTRAMUSCULAR | Status: DC | PRN
  Administered 2023-11-25: 3 mL via EPIDURAL

## 2023-11-25 MED ORDER — FENTANYL-BUPIVACAINE-NACL 0.5-0.125-0.9 MG/250ML-% EP SOLN
EPIDURAL | Status: DC | PRN
  Administered 2023-11-25: 12 mL/h via EPIDURAL

## 2023-11-25 MED ORDER — LACTATED RINGERS IV SOLN
500.0000 mL | INTRAVENOUS | Status: DC | PRN
  Administered 2023-11-25: 500 mL via INTRAVENOUS

## 2023-11-25 MED ORDER — ACETAMINOPHEN 500 MG PO TABS
1000.0000 mg | ORAL_TABLET | Freq: Four times a day (QID) | ORAL | Status: DC | PRN
Start: 2023-11-25 — End: 2023-11-27
  Administered 2023-11-25 – 2023-11-26 (×2): 1000 mg via ORAL
  Filled 2023-11-25 (×2): qty 2

## 2023-11-25 MED ORDER — TERBUTALINE SULFATE 1 MG/ML IJ SOLN
0.2500 mg | Freq: Once | INTRAMUSCULAR | Status: DC | PRN
  Filled 2023-11-25: qty 1

## 2023-11-25 MED ORDER — LIDOCAINE HCL (PF) 1 % IJ SOLN: 30.0000 mL | INTRAMUSCULAR | Status: DC | PRN

## 2023-11-25 MED ORDER — SOD CITRATE-CITRIC ACID 500-334 MG/5ML PO SOLN: 30.0000 mL | ORAL | Status: DC | PRN

## 2023-11-25 MED ORDER — LACTATED RINGERS IV SOLN: INTRAVENOUS | Status: DC

## 2023-11-25 MED ORDER — CALCIUM CARBONATE ANTACID 500 MG PO CHEW
2.0000 | CHEWABLE_TABLET | Freq: Three times a day (TID) | ORAL | Status: DC | PRN
  Administered 2023-11-25: 400 mg via ORAL
  Filled 2023-11-25: qty 2

## 2023-11-25 MED ORDER — BENZOCAINE-MENTHOL 20-0.5 % EX AERO
1.0000 | INHALATION_SPRAY | CUTANEOUS | Status: DC | PRN
  Filled 2023-11-25: qty 56

## 2023-11-25 MED ORDER — MISOPROSTOL 25 MCG QUARTER TABLET
25.0000 ug | ORAL_TABLET | Freq: Once | ORAL | Status: AC
  Administered 2023-11-25: 50 ug via VAGINAL
  Filled 2023-11-25: qty 1

## 2023-11-25 MED ORDER — MISOPROSTOL 50MCG HALF TABLET
50.0000 ug | ORAL_TABLET | ORAL | Status: DC | PRN
  Filled 2023-11-25: qty 1

## 2023-11-25 MED ORDER — LIDOCAINE HCL (PF) 1 % IJ SOLN
INTRAMUSCULAR | Status: DC | PRN
  Administered 2023-11-25: 3 mL via SUBCUTANEOUS

## 2023-11-25 MED ORDER — FENTANYL CITRATE (PF) 100 MCG/2ML IJ SOLN
50.0000 ug | INTRAMUSCULAR | Status: DC | PRN
Start: 2023-11-25 — End: 2023-11-25

## 2023-11-25 MED ORDER — OXYTOCIN BOLUS FROM INFUSION
333.0000 mL | Freq: Once | INTRAVENOUS | Status: AC
  Administered 2023-11-25: 333 mL via INTRAVENOUS

## 2023-11-25 MED ORDER — OXYTOCIN-SODIUM CHLORIDE 30-0.9 UT/500ML-% IV SOLN
1.0000 m[IU]/min | INTRAVENOUS | Status: DC
  Administered 2023-11-25: 2 m[IU]/min via INTRAVENOUS
  Filled 2023-11-25: qty 500

## 2023-11-25 MED ORDER — WITCH HAZEL-GLYCERIN EX PADS
1.0000 | MEDICATED_PAD | CUTANEOUS | Status: DC | PRN
  Filled 2023-11-25: qty 100

## 2023-11-25 NOTE — Progress Notes (Signed)
 Patient education complete about possible need to check infant's glucoses and supplemented feedings.  MOB and FOB attentive and at this point all questions answered.

## 2023-11-25 NOTE — Progress Notes (Signed)
 LABOR NOTE   Daisy Douglas 28 y.o.GP@ at [redacted]w[redacted]d  SUBJECTIVE:  Uncomfortable with contractions state 7/10 pain.  Analgesia: Labor support without medications  OBJECTIVE:  Temp 97.9 F (36.6 C) (Oral)   Resp 18   Ht 5\' 4"  (1.626 m)   Wt 107 kg   LMP 01/27/2023 (Approximate)   SpO2 99%   BMI 40.51 kg/m  No intake/output data recorded.  She has shown cervical change. CERVIX: 3 cm:  70%:   -2:   posterior:   soft SVE:   Dilation: 3 Effacement (%): 70 Station: -2 Exam by:: Janee Morn CNM CONTRACTIONS: regular, every 2 minutes FHR: Fetal heart tracing reviewed. Baseline: 140 bpm, Variability: Good {> 6 bpm), Accelerations: Reactive, and Decelerations: Absent Category I    Labs: Lab Results  Component Value Date   WBC 9.4 11/25/2023   HGB 9.5 (L) 11/25/2023   HCT 29.3 (L) 11/25/2023   MCV 78.8 (L) 11/25/2023   PLT 191 11/25/2023    ASSESSMENT: 1) Labor curve reviewed.       Progress: Early latent labor.     Membranes: intact            Principal Problem:   Pregnancy affected by intrauterine growth restriction (IUGR)   PLAN: continue present management Discussed use of pitocin as needed if contraction frequency /intensity declines. She verbalizes and agrees to plan. Dr. Lonny Prude is aware of progress and plan.   Doreene Burke, CNM  11/25/2023 2:02 PM

## 2023-11-25 NOTE — Anesthesia Preprocedure Evaluation (Signed)
 Anesthesia Evaluation  Patient identified by MRN, date of birth, ID band Patient awake    Reviewed: H&P , NPO status , Patient's Chart, lab work & pertinent test results  Airway Mallampati: II       Dental   Pulmonary    Pulmonary exam normal        Cardiovascular Normal cardiovascular exam     Neuro/Psych   Anxiety        GI/Hepatic ,GERD  ,,  Endo/Other  diabetes    Renal/GU      Musculoskeletal   Abdominal   Peds  Hematology negative hematology ROS (+)   Anesthesia Other Findings   Reproductive/Obstetrics                             Anesthesia Physical Anesthesia Plan  ASA: 2  Anesthesia Plan: Epidural   Post-op Pain Management:    Induction:   PONV Risk Score and Plan:   Airway Management Planned:   Additional Equipment:   Intra-op Plan:   Post-operative Plan:   Informed Consent: I have reviewed the patients History and Physical, chart, labs and discussed the procedure including the risks, benefits and alternatives for the proposed anesthesia with the patient or authorized representative who has indicated his/her understanding and acceptance.     Dental Advisory Given  Plan Discussed with: Anesthesiologist and CRNA  Anesthesia Plan Comments:        Anesthesia Quick Evaluation

## 2023-11-25 NOTE — Progress Notes (Signed)
 LABOR NOTE   Daisy Douglas 28 y.o.GP@ at [redacted]w[redacted]d  SUBJECTIVE:  Comfortable with epidural Analgesia: Epidural  OBJECTIVE:  BP 115/67   Pulse 74   Temp 98.5 F (36.9 C) (Oral)   Resp 16   Ht 5\' 4"  (1.626 m)   Wt 107 kg   LMP 01/27/2023 (Approximate)   SpO2 100%   BMI 40.51 kg/m  No intake/output data recorded.  She has shown cervical change. CERVIX: 4 cm:  80%:   -2:   posterior:   soft SVE:   Dilation: 4 Effacement (%): 70 Station: -2 Exam by:: Janee Morn CNM CONTRACTIONS: regular, every 2 minutes FHR: Fetal heart tracing reviewed. Baseline: 140 bpm, Variability: Good {> 6 bpm), Accelerations: Reactive, and Decelerations: repetitive late's after epidural placement (x 20 min). Resolved with position change, IV bolus, pitocin turned off.  Category II    Labs: Lab Results  Component Value Date   WBC 9.4 11/25/2023   HGB 9.5 (L) 11/25/2023   HCT 29.3 (L) 11/25/2023   MCV 78.8 (L) 11/25/2023   PLT 191 11/25/2023    ASSESSMENT: 1) Labor curve reviewed.       Progress: Early latent labor.     Membranes: artificial ruptured, clear fluid       Principal Problem:   Pregnancy affected by intrauterine growth restriction (IUGR)   PLAN: continue present management  Doreene Burke, CNM  11/25/2023 6:37 PM

## 2023-11-25 NOTE — Anesthesia Procedure Notes (Signed)
 Epidural Patient location during procedure: OB Start time: 11/25/2023 5:15 PM End time: 11/25/2023 5:20 PM  Staffing Anesthesiologist: Yevette Edwards, MD Resident/CRNA: Clydia Llano, CRNA Performed: resident/CRNA   Preanesthetic Checklist Completed: patient identified, IV checked, site marked, risks and benefits discussed, surgical consent, monitors and equipment checked, pre-op evaluation and timeout performed  Epidural Patient position: sitting Prep: ChloraPrep Patient monitoring: heart rate, continuous pulse ox and blood pressure Approach: midline Location: L3-L4 Injection technique: LOR saline  Needle:  Needle type: Tuohy  Needle gauge: 17 G Needle length: 9 cm and 9 Needle insertion depth: 7 cm Catheter type: closed end flexible Catheter size: 19 Gauge Catheter at skin depth: 12 cm Test dose: negative and 1.5% lidocaine with Epi 1:200 K  Assessment Events: blood not aspirated, no cerebrospinal fluid, injection not painful, no injection resistance, no paresthesia and negative IV test  Additional Notes 1 attempt Pt. Evaluated and documentation done after procedure finished. Patient identified. Risks/Benefits/Options discussed with patient including but not limited to bleeding, infection, nerve damage, paralysis, failed block, incomplete pain control, headache, blood pressure changes, nausea, vomiting, reactions to medication both or allergic, itching and postpartum back pain. Confirmed with bedside nurse the patient's most recent platelet count. Confirmed with patient that they are not currently taking any anticoagulation, have any bleeding history or any family history of bleeding disorders. Patient expressed understanding and wished to proceed. All questions were answered. Sterile technique was used throughout the entire procedure. Please see nursing notes for vital signs. Test dose was given through epidural catheter and negative prior to continuing to dose epidural or start  infusion. Warning signs of high block given to the patient including shortness of breath, tingling/numbness in hands, complete motor block, or any concerning symptoms with instructions to call for help. Patient was given instructions on fall risk and not to get out of bed. All questions and concerns addressed with instructions to call with any issues or inadequate analgesia.    Patient tolerated the insertion well without immediate complications.Reason for block:procedure for pain

## 2023-11-25 NOTE — Plan of Care (Signed)
  Problem: Education: Goal: Knowledge of Childbirth will improve Outcome: Adequate for Discharge Goal: Ability to make informed decisions regarding treatment and plan of care will improve Outcome: Adequate for Discharge Goal: Ability to state and carry out methods to decrease the pain will improve Outcome: Adequate for Discharge Goal: Individualized Educational Video(s) Outcome: Adequate for Discharge   Problem: Coping: Goal: Ability to verbalize concerns and feelings about labor and delivery will improve Outcome: Adequate for Discharge   Problem: Life Cycle: Goal: Ability to make normal progression through stages of labor will improve Outcome: Adequate for Discharge Goal: Ability to effectively push during vaginal delivery will improve Outcome: Adequate for Discharge   Problem: Role Relationship: Goal: Will demonstrate positive interactions with the child Outcome: Adequate for Discharge   Problem: Safety: Goal: Risk of complications during labor and delivery will decrease Outcome: Adequate for Discharge   Problem: Pain Management: Goal: Relief or control of pain from uterine contractions will improve Outcome: Adequate for Discharge   Problem: Education: Goal: Knowledge of General Education information will improve Description: Including pain rating scale, medication(s)/side effects and non-pharmacologic comfort measures Outcome: Adequate for Discharge   Problem: Health Behavior/Discharge Planning: Goal: Ability to manage health-related needs will improve Outcome: Adequate for Discharge   Problem: Clinical Measurements: Goal: Ability to maintain clinical measurements within normal limits will improve Outcome: Adequate for Discharge Goal: Will remain free from infection Outcome: Adequate for Discharge Goal: Diagnostic test results will improve Outcome: Adequate for Discharge Goal: Respiratory complications will improve Outcome: Adequate for Discharge Goal:  Cardiovascular complication will be avoided Outcome: Adequate for Discharge   Problem: Activity: Goal: Risk for activity intolerance will decrease Outcome: Adequate for Discharge   Problem: Nutrition: Goal: Adequate nutrition will be maintained Outcome: Adequate for Discharge   Problem: Coping: Goal: Level of anxiety will decrease Outcome: Adequate for Discharge   Problem: Elimination: Goal: Will not experience complications related to bowel motility Outcome: Adequate for Discharge Goal: Will not experience complications related to urinary retention Outcome: Adequate for Discharge   Problem: Pain Managment: Goal: General experience of comfort will improve and/or be controlled Outcome: Adequate for Discharge   Problem: Safety: Goal: Ability to remain free from injury will improve Outcome: Adequate for Discharge   Problem: Skin Integrity: Goal: Risk for impaired skin integrity will decrease Outcome: Adequate for Discharge

## 2023-11-25 NOTE — H&P (Signed)
 History and Physical   HPI  Daisy Douglas is a 28 y.o. G2P1001 at [redacted]w[redacted]d Estimated Date of Delivery: 12/05/23 who is being admitted for induction due to IUGR.   OB History  OB History  Gravida Para Term Preterm AB Living  2 1 1  0 0 1  SAB IAB Ectopic Multiple Live Births  0 0 0 0 1    # Outcome Date GA Lbr Len/2nd Weight Sex Type Anes PTL Lv  2 Current           1 Term 10/20/21 [redacted]w[redacted]d 15:39 / 00:32 3240 g M Vag-Spont EPI  LIV     Name: Daisy Douglas     Apgar1: 9  Apgar5: 9    PROBLEM LIST  Pregnancy complications or risks: Patient Active Problem List   Diagnosis Date Noted   Pregnancy affected by intrauterine growth restriction (IUGR) 11/25/2023   Abdominal pain during pregnancy in third trimester 11/23/2023   Fetal growth restriction antepartum 11/13/2023   Uterine prolapse 05/24/2023   Supervision of other normal pregnancy, antepartum 04/10/2023   Uterine contractions 10/19/2021   Pregnancy 10/02/2021   Pregnancy complicated by obesity, third trimester 07/10/2021   Anxiety 01/20/2019   Vitamin D deficiency 01/20/2019    Prenatal labs and studies: ABO, Rh: --/--/PENDING (04/07 4098) Antibody: PENDING (04/07 0921) Rubella: 1.99 (10/04 1119) RPR: Non Reactive (01/28 0956)  HBsAg: Negative (10/04 1119)  HIV: Non Reactive (01/28 0956)  JXB:JYNWGNFA/-- (03/26 1419)   Past Medical History:  Diagnosis Date   Dysmenorrhea    Gestational diabetes    Pre-diabetes    Type O blood, Rh negative 01/2021   Vitamin D deficiency      Past Surgical History:  Procedure Laterality Date   NO PAST SURGERIES       Medications    Current Discharge Medication List     CONTINUE these medications which have NOT CHANGED   Details  ferrous sulfate 325 (65 FE) MG EC tablet TAKE 1 TABLET BY MOUTH IN THE MORNING AND AT BEDTIME. Qty: 180 tablet, Refills: 1   Associated Diagnoses: Anemia during pregnancy in third trimester    ondansetron (ZOFRAN-ODT) 4 MG  disintegrating tablet Take 1 tablet (4 mg total) by mouth every 8 (eight) hours as needed for nausea or vomiting. Qty: 20 tablet, Refills: 1    Prenatal Vit-Fe Fumarate-FA (MULTIVITAMIN-PRENATAL) 27-0.8 MG TABS tablet Take 1 tablet by mouth daily at 12 noon.         Allergies  Patient has no known allergies.  Review of Systems  Constitutional: negative Eyes: negative Ears, nose, mouth, throat, and face: negative Respiratory: negative Cardiovascular: negative Gastrointestinal: negative Genitourinary:negative Integument/breast: negative Hematologic/lymphatic: negative Musculoskeletal:negative Neurological: negative Behavioral/Psych: negative Endocrine: negative Allergic/Immunologic: negative  Physical Exam  Temp 97.9 F (36.6 C) (Oral)   Resp 18   Ht 5\' 4"  (1.626 m)   Wt 107 kg   LMP 01/27/2023 (Approximate)   SpO2 99%   BMI 40.51 kg/m   Lungs:  CTA B Cardio: RRR without M/R/G Abd: Soft, gravid, NT Presentation: cephalic EXT: No C/C/ 1+ Edema DTRs: 2+ B CERVIX: Dilation: 1.5 Effacement (%): Thick Cervical Position: Posterior Presentation: Vertex Exam by:: Theotis Burrow  See Prenatal records for more detailed PE.     FHR:  Baseline: 135 bpm, Variability: Good {> 6 bpm), Accelerations: Reactive, and Decelerations: Absent  Toco: Uterine Contractions: Frequency: irregular mild   Test Results  Results for orders placed or performed during the hospital encounter of  11/25/23 (from the past 24 hours)  CBC     Status: Abnormal   Collection Time: 11/25/23  9:21 AM  Result Value Ref Range   WBC 9.4 4.0 - 10.5 K/uL   RBC 3.72 (L) 3.87 - 5.11 MIL/uL   Hemoglobin 9.5 (L) 12.0 - 15.0 g/dL   HCT 16.1 (L) 09.6 - 04.5 %   MCV 78.8 (L) 80.0 - 100.0 fL   MCH 25.5 (L) 26.0 - 34.0 pg   MCHC 32.4 30.0 - 36.0 g/dL   RDW 40.9 (H) 81.1 - 91.4 %   Platelets 191 150 - 400 K/uL   nRBC 0.0 0.0 - 0.2 %  Type and screen     Status: None (Preliminary result)   Collection  Time: 11/25/23  9:21 AM  Result Value Ref Range   ABO/RH(D) PENDING    Antibody Screen PENDING    Sample Expiration      11/28/2023,2359 Performed at [facility], [address] [place], [place] [address]    Group B Strep negative  Assessment   G2P1001 at [redacted]w[redacted]d Estimated Date of Delivery: 12/05/23  The fetus is reassuring.   Patient Active Problem List   Diagnosis Date Noted   Pregnancy affected by intrauterine growth restriction (IUGR) 11/25/2023   Abdominal pain during pregnancy in third trimester 11/23/2023   Fetal growth restriction antepartum 11/13/2023   Uterine prolapse 05/24/2023   Supervision of other normal pregnancy, antepartum 04/10/2023   Uterine contractions 10/19/2021   Pregnancy 10/02/2021   Pregnancy complicated by obesity, third trimester 07/10/2021   Anxiety 01/20/2019   Vitamin D deficiency 01/20/2019    Plan  1. Admit to L&D :  for induction due to IUGR 2. EFM:-- Category 1 3. IV pain medication, nitrous, or Epidural if desired.   4. Admission labs  5. Discussed inductions methods including cooks catheter, cytotec, pitocin, AROM. She declines use of cooks catheter. Cytotec 25 mcg oral, 50 mcg vaginally placed.  6.Dr. Lonny Prude aware of admission and plan of care.   Doreene Burke, CNM  11/25/2023 10:22 AM

## 2023-11-25 NOTE — Discharge Summary (Signed)
 Postpartum Discharge Summary  Date of Service updated***     Patient Name: Daisy Douglas DOB: 03/17/1996 MRN: [id number]  Date of admission: 11/25/2023 Delivery date:11/25/2023 Delivering provider:   Date of discharge: 11/25/2023  Admitting diagnosis: Pregnancy affected by intrauterine growth restriction (IUGR) [O36.5990] Intrauterine pregnancy: [redacted]w[redacted]d     Secondary diagnosis:  Principal Problem:   Pregnancy affected by intrauterine growth restriction (IUGR)  Additional problems: Obesity     Discharge diagnosis: Term Pregnancy Delivered                                              Post partum procedures:{Postpartum procedures:23558} Augmentation: AROM, Pitocin, and Cytotec Complications: None  Hospital course: Induction of Labor With Vaginal Delivery   28 y.o. yo G2P1001 at [redacted]w[redacted]d was admitted to the hospital 11/25/2023 for induction of labor.  Indication for induction:  IUGR .  Patient had an labor course uncomplicated.  Membrane Rupture Time/Date: 6:32 PM,11/25/2023  Delivery Method:Vaginal, Spontaneous Operative Delivery:N/A Episiotomy: None Lacerations:  None Details of delivery can be found in separate delivery note.  Patient had a postpartum course complicated by***. Patient is discharged home 11/25/23.  Newborn Data: Birth date:11/25/2023 Birth time:9:15 PM Gender:Female Living status:  Apgars: ,  Weight:2810 g  Magnesium Sulfate received: No BMZ received: No Rhophylac:{Rhophylac received:30440032} MMR:No T-DaP:Given prenatally Flu: Yes RSV Vaccine received: No Transfusion:No Immunizations administered: Immunization History  Administered Date(s) Administered   Dtap, Unspecified 09/06/1998, 11/10/1998, 01/20/1999, 07/30/2000, 06/05/2004   HIB (PRP-OMP) 09/06/1998, 11/10/1998, 10/31/1999   HPV 9-valent 03/08/2015   HPV Quadrivalent 04/10/2013   Hepatitis A 03/29/2009, 04/10/2013   Hepatitis B 09/06/1998, 11/10/1998, 10/31/1999   IPV 09/06/1998, 11/10/1998,  07/30/2000, 06/05/2004   Influenza-Unspecified 08/05/2023   MMR 07/21/1999, 02/09/2003   Meningococcal Conjugate 04/10/2013, 03/08/2015   PFIZER Comirnaty(Gray Top)Covid-19 Tri-Sucrose Vaccine 05/01/2020, 08/10/2020   PFIZER(Purple Top)SARS-COV-2 Vaccination 05/01/2020, 08/10/2020   Pneumococcal Conjugate-13 11/05/1999   Rho (D) Immune Globulin 08/11/2021   Tdap 10/13/2013, 10/21/2021, 09/17/2023   Varicella 07/21/1999, 04/10/2013    Physical exam  Vitals:   11/25/23 1931 11/25/23 2000 11/25/23 2030 11/25/23 2100  BP: 126/71 129/75 (!) 119/96 119/87  Pulse: 74 76 77 73  Resp: 18     Temp: (!) 97.5 F (36.4 C)   97.7 F (36.5 C)  TempSrc: Oral   Oral  SpO2: 100% 100%  97%  Weight:      Height:       General: {Exam; general:21111117} Lochia: {Desc; appropriate/inappropriate:30686::"appropriate"} Uterine Fundus: {Desc; firm/soft:30687} Incision: {Exam; incision:21111123} DVT Evaluation: {Exam; NWG:9562130} Labs: Lab Results  Component Value Date   WBC 9.4 11/25/2023   HGB 9.5 (L) 11/25/2023   HCT 29.3 (L) 11/25/2023   MCV 78.8 (L) 11/25/2023   PLT 191 11/25/2023      Latest Ref Rng & Units 05/24/2023   11:19 AM  CMP  Glucose 70 - 99 mg/dL 97   BUN 6 - 20 mg/dL 6   Creatinine 8.65 - 7.84 mg/dL 6.96   Sodium 295 - 284 mmol/L 136   Potassium 3.5 - 5.2 mmol/L 4.3   Chloride 96 - 106 mmol/L 101   CO2 20 - 29 mmol/L 20   Calcium 8.7 - 10.2 mg/dL 9.3   Total Protein 6.0 - 8.5 g/dL 7.1   Total Bilirubin 0.0 - 1.2 mg/dL 0.3   Alkaline Phos 44 - 121 IU/L 98  AST 0 - 40 IU/L 13   ALT 0 - 32 IU/L 10    Edinburgh Score:    04/10/2023    1:36 PM  Edinburgh Postnatal Depression Scale Screening Tool  I have been able to laugh and see the funny side of things. 0  I have looked forward with enjoyment to things. 0  I have blamed myself unnecessarily when things went wrong. 0  I have been anxious or worried for no good reason. 1  I have felt scared or panicky for no good  reason. 1  Things have been getting on top of me. 2  I have been so unhappy that I have had difficulty sleeping. 1  I have felt sad or miserable. 1  I have been so unhappy that I have been crying. 1  The thought of harming myself has occurred to me. 0  Edinburgh Postnatal Depression Scale Total 7      After visit meds:  Allergies as of 11/25/2023   No Known Allergies   Med Rec must be completed prior to using this [facility]***        Discharge home in stable condition Infant Feeding: {Baby feeding:23562} Infant Disposition:{CHL IP OB HOME WITH ZOXWRU:04540} Discharge instruction: per After Visit Summary and Postpartum booklet. Activity: Advance as tolerated. Pelvic rest for 6 weeks.  Diet: {OB diet:21111121} Anticipated Birth Control: {Birth Control:23956} Postpartum Appointment:{Outpatient follow up:23559} Additional Postpartum F/U: {PP Procedure:23957} Future Appointments:No future appointments. Follow up Visit:      11/25/2023 Doreene Burke, CNM

## 2023-11-26 ENCOUNTER — Encounter: Payer: Self-pay | Admitting: Obstetrics

## 2023-11-26 LAB — FETAL SCREEN: Fetal Screen: NEGATIVE

## 2023-11-26 LAB — CBC
HCT: 28.3 % — ABNORMAL LOW (ref 36.0–46.0)
Hemoglobin: 9.4 g/dL — ABNORMAL LOW (ref 12.0–15.0)
MCH: 25.5 pg — ABNORMAL LOW (ref 26.0–34.0)
MCHC: 33.2 g/dL (ref 30.0–36.0)
MCV: 76.7 fL — ABNORMAL LOW (ref 80.0–100.0)
Platelets: 178 10*3/uL (ref 150–400)
RBC: 3.69 MIL/uL — ABNORMAL LOW (ref 3.87–5.11)
RDW: 15.9 % — ABNORMAL HIGH (ref 11.5–15.5)
WBC: 11.1 10*3/uL — ABNORMAL HIGH (ref 4.0–10.5)
nRBC: 0 % (ref 0.0–0.2)

## 2023-11-26 LAB — RPR: RPR Ser Ql: NONREACTIVE

## 2023-11-26 MED ORDER — SENNOSIDES-DOCUSATE SODIUM 8.6-50 MG PO TABS
2.0000 | ORAL_TABLET | ORAL | Status: DC
  Administered 2023-11-26 – 2023-11-27 (×2): 2 via ORAL
  Filled 2023-11-26 (×3): qty 2

## 2023-11-26 MED ORDER — PRENATAL MULTIVITAMIN CH
1.0000 | ORAL_TABLET | Freq: Every day | ORAL | Status: DC
  Administered 2023-11-26 – 2023-11-27 (×2): 1 via ORAL
  Filled 2023-11-26 (×2): qty 1

## 2023-11-26 MED ORDER — RHO D IMMUNE GLOBULIN 1500 UNIT/2ML IJ SOSY
300.0000 ug | PREFILLED_SYRINGE | Freq: Once | INTRAMUSCULAR | Status: AC
  Administered 2023-11-26: 300 ug via INTRAVENOUS
  Filled 2023-11-26: qty 2

## 2023-11-26 MED ORDER — TETANUS-DIPHTH-ACELL PERTUSSIS 5-2.5-18.5 LF-MCG/0.5 IM SUSY: 0.5000 mL | PREFILLED_SYRINGE | Freq: Once | INTRAMUSCULAR | Status: DC

## 2023-11-26 MED ORDER — ONDANSETRON HCL 4 MG/2ML IJ SOLN: 4.0000 mg | INTRAMUSCULAR | Status: DC | PRN

## 2023-11-26 MED ORDER — OXYCODONE HCL 5 MG PO TABS: 5.0000 mg | ORAL_TABLET | ORAL | Status: DC | PRN

## 2023-11-26 MED ORDER — SIMETHICONE 80 MG PO CHEW: 80.0000 mg | CHEWABLE_TABLET | ORAL | Status: DC | PRN

## 2023-11-26 MED ORDER — ONDANSETRON HCL 4 MG PO TABS: 4.0000 mg | ORAL_TABLET | ORAL | Status: DC | PRN

## 2023-11-26 MED ORDER — OXYCODONE HCL 5 MG PO TABS: 10.0000 mg | ORAL_TABLET | ORAL | Status: DC | PRN

## 2023-11-26 MED ORDER — COCONUT OIL OIL: 1.0000 | TOPICAL_OIL | Status: DC | PRN

## 2023-11-26 MED ORDER — DOCUSATE SODIUM 100 MG PO CAPS
100.0000 mg | ORAL_CAPSULE | Freq: Two times a day (BID) | ORAL | Status: DC
  Administered 2023-11-27: 100 mg via ORAL
  Filled 2023-11-26: qty 1

## 2023-11-26 MED ORDER — IBUPROFEN 600 MG PO TABS
600.0000 mg | ORAL_TABLET | Freq: Four times a day (QID) | ORAL | Status: DC
  Administered 2023-11-26 – 2023-11-27 (×6): 600 mg via ORAL
  Filled 2023-11-26 (×6): qty 1

## 2023-11-26 MED ORDER — FERROUS SULFATE 325 (65 FE) MG PO TABS
325.0000 mg | ORAL_TABLET | Freq: Every day | ORAL | Status: DC
  Administered 2023-11-26 – 2023-11-27 (×2): 325 mg via ORAL
  Filled 2023-11-26 (×2): qty 1

## 2023-11-26 NOTE — Progress Notes (Signed)
 Post Partum Day 1  Subjective: Daisy Douglas is resting in bed. She has been ambulating around the room, no difficulty urinating, and has been breastfeeding. She has not had a bowel movement yet. She reports she is feeling well.   Objective: Blood pressure 126/69, pulse 72, temperature 97.6 F (36.4 C), temperature source Oral, resp. rate 17, height 5\' 4"  (1.626 m), weight 107 kg, last menstrual period 01/27/2023, SpO2 100%, unknown if currently breastfeeding.  Physical Exam:  General: alert Cardiac: RRR, no murmurs, rubs, gallops auscultated  Respiratory: CTAB Lochia: appropriate Uterine Fundus: firm Perineum: no swelling noted Incision: n/a DVT Evaluation: No evidence of DVT seen on physical exam.  Recent Labs    11/25/23 0921 11/26/23 0551  HGB 9.5* 9.4*  HCT 29.3* 28.3*    Assessment/Plan: Plan for discharge tomorrow, as 24 hr mark will be tonight.  Continue routine postpartum orders Discussed PP warning signs.    LOS: 1 day   Eloise Levels, Student-MidWife 11/26/2023, 10:32 AM  Carie Caddy, CNM present for all portions of care.

## 2023-11-26 NOTE — Lactation Note (Signed)
 This note was copied from a baby's chart. Lactation Consultation Note  Patient Name: Boy Leora Platt ZOXWR'U Date: 11/26/2023 Age:26 hours Reason for consult: Initial assessment;Breastfeeding assistance;RN request;Early term 37-38.6wks  "Asan" baby at 19 hours old. Upon entering room, baby was swaddled in bassinet. Mother changed a meconium diaper to then attempt a breastfeeding session.   Maternal Data Has patient been taught Hand Expression?: Yes Does the patient have breastfeeding experience prior to this delivery?: Yes How long did the patient breastfeed?: 3 months Mother's infant feeding goals are to [facility] "Asan". Mother reports infant sleepy behavior and limited suckling during each breastfeeding attempt and regurgitation of fluids throughout the day.   Feeding Mother's Current Feeding Choice: Breast Milk Baby latched on the right breast in football hold at 11:14 am and continued suckling while [facility] student left room. Recommended mother to keep him latched until he detaches, burp "Asan", and have FOB swaddle him. Recommended to feed on left breast at next feeding to continue nipple stimulation.   LATCH Score Latch: Repeated attempts needed to sustain latch, nipple held in mouth throughout feeding, stimulation needed to elicit sucking reflex.  Audible Swallowing: A few with stimulation  Type of Nipple: Flat  Comfort (Breast/Nipple): Soft / non-tender  Hold (Positioning): Assistance needed to correctly position infant at breast and maintain latch.  LATCH Score: 6   Lactation Tools Discussed/Used  Encourage STS and possible syringe or cup use to feed baby colostrum when latching difficulty is presented.  Interventions Interventions: Support pillows;Adjust position;Breast compression;Hand express;Skin to skin;Assisted with latch;Breast feeding basics reviewed;Education Milk production , breast stimulation, hand expression, adequate infant intake, positioning, and stomach size  education discussed and demonstrated. Discharge Pump: Personal (Medela Hands Free pump)  Consult Status Consult Status: Follow-up Date: 11/26/23 Follow-up type: In-patient   Chinita Greenland 11/26/2023, 11:36 AM

## 2023-11-26 NOTE — Anesthesia Postprocedure Evaluation (Signed)
 Anesthesia Post Note  Patient: Daisy Douglas  Procedure(s) Performed: AN AD HOC LABOR EPIDURAL  Patient location during evaluation: Mother Baby Anesthesia Type: Epidural Level of consciousness: awake and alert Pain management: pain level controlled Vital Signs Assessment: post-procedure vital signs reviewed and stable Respiratory status: spontaneous breathing, nonlabored ventilation and respiratory function stable Cardiovascular status: stable Postop Assessment: no headache, no backache and epidural receding Anesthetic complications: no  No notable events documented.   Last Vitals:  Vitals:   11/26/23 0321 11/26/23 0750  BP: 128/86 126/69  Pulse: 64 72  Resp: 18 17  Temp: 36.7 C 36.4 C  SpO2: 99% 100%    Last Pain:  Vitals:   11/26/23 0750  TempSrc: Oral  PainSc: 0-No pain                 Gearldean Lomanto B Alonza Smoker

## 2023-11-27 ENCOUNTER — Telehealth: Payer: Self-pay

## 2023-11-27 ENCOUNTER — Encounter: Admitting: Physical Therapy

## 2023-11-27 LAB — RHOGAM INJECTION: Unit division: 0

## 2023-11-27 MED ORDER — ACETAMINOPHEN 500 MG PO TABS: 1000.0000 mg | ORAL_TABLET | Freq: Four times a day (QID) | ORAL | 0 refills | Status: AC | PRN

## 2023-11-27 MED ORDER — IBUPROFEN 600 MG PO TABS
600.0000 mg | ORAL_TABLET | Freq: Four times a day (QID) | ORAL | 0 refills | Status: AC
Start: 2023-11-27 — End: ?
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
## 2023-11-27 NOTE — Telephone Encounter (Signed)
 Pt left msg on triage stating she has a qs on a Rx sent to her pharmacy. Called her back, no answer, LVMTRC.

## 2023-11-27 NOTE — Discharge Instructions (Signed)
 Intensive Outpatient Programs   [facility] [address] [place],  [place], [place] [phone]  [facility] (Inpatient and [phone] (Suboxone and Methadone) [address] [phone]  ADS: Alcohol & Drug [facility] Programs - Intensive Outpatient [address] [place], [place] [address], [place]  [phone]  [facility] (Outpatient, Inpatient, [facility] (Groups and Residental) (insurance only) [phone] [place], [place] [phone]   [facility] (for caregivers and family) [address] Laurell Josephs [address], [place], [place] [phone]  Residential Treatment Programs  [facility](2 years) Residential: 42 days)[facility] [address] [facility] [address] [place], [place], [place] [phone] or [phone]  [facility] [address] [place], [place], [place] [phone]  [facility] ([facility]S) [address] [place], [place], [place] [phone] Admissions: 8am-3pm M-F  [facility] Program: Residential Program [phone] Days)             [facility]: [facility]  [place], [place], [place]  [phone] or [phone] in Hours over the weekend or by referral)  [facility] [address] [place], [place] [address] [phone] (Do virtual or phone assessment, offer transportation within 25 miles, have in patient and Outpatient options)   Mobil Crisis: [facility]:[phone] (for crisis  response 24 hours a day)     [facility] DSS ( WIC/FS services)  [address] [place], [place], [place] [address] [phone]

## 2023-11-27 NOTE — Plan of Care (Signed)

## 2023-11-27 NOTE — Lactation Note (Signed)
 This note was copied from a baby's chart. Lactation Consultation Note  Patient Name: Daisy Douglas [id number] Date: 11/27/2023 Age:26 hours Reason for consult: Follow-up assessment;Early term 37-38.6wks   Maternal Data This is mom's 2nd baby, SVD. Mom with history of vitamin D deficiency, anxiety, obesity. On follow-up visit today mom is breastfeeding when [name] entered the room. Mom is breastfeeding and bottle feeding.Mom reports normative nipple tenderness, nipples are intact. Has patient been taught Hand Expression?: Yes Does the patient have breastfeeding experience prior to this delivery?: Yes How long did the patient breastfeed?: 3 months  Feeding Mother's Current Feeding Choice: Breast Milk and Formula Nipple Type: Slow - flow  LATCH Score Latch: Grasps breast easily, tongue down, lips flanged, rhythmical sucking.  Audible Swallowing: Spontaneous and intermittent  Type of Nipple: Everted at rest and after stimulation  Comfort (Breast/Nipple): Filling, red/small blisters or bruises, mild/mod discomfort (Per mom she can feel her breasts filling. Mom with some slight nipple tenderness.Nipples are intact.)  Hold (Positioning): No assistance needed to correctly position infant at breast.  LATCH Score: 9   Interventions Interventions: Breast feeding basics reviewed;Education (lanolin)  Discharge Discharge Education: Engorgement and breast care;Warning signs for feeding baby;Outpatient recommendation Pump: Personal  Consult Status Consult Status: Complete Date: 11/27/23 Follow-up type: In-patient  Update provided to care nurse.  Fuller Song 11/27/2023, 1:18 PM

## 2023-12-04 ENCOUNTER — Other Ambulatory Visit (HOSPITAL_COMMUNITY)
Admission: RE | Admit: 2023-12-04 | Discharge: 2023-12-04 | Disposition: A | Discharge status: Home / Self Care | Source: Ambulatory Visit | Attending: Certified Nurse Midwife | Admitting: Certified Nurse Midwife

## 2023-12-04 ENCOUNTER — Telehealth: Payer: Self-pay

## 2023-12-04 ENCOUNTER — Ambulatory Visit: Admitting: Certified Nurse Midwife

## 2023-12-04 VITALS — BP 123/86 | HR 89 | Wt 223.8 lb

## 2023-12-04 DIAGNOSIS — Z8619 Personal history of other infectious and parasitic diseases: Secondary | ICD-10-CM | POA: Insufficient documentation

## 2023-12-04 DIAGNOSIS — Z1331 Encounter for screening for depression: Secondary | ICD-10-CM

## 2023-12-04 DIAGNOSIS — F53 Postpartum depression: Secondary | ICD-10-CM

## 2023-12-04 DIAGNOSIS — Z113 Encounter for screening for infections with a predominantly sexual mode of transmission: Secondary | ICD-10-CM | POA: Insufficient documentation

## 2023-12-04 DIAGNOSIS — Z1332 Encounter for screening for maternal depression: Secondary | ICD-10-CM

## 2023-12-04 MED ORDER — SERTRALINE HCL 50 MG PO TABS: 50.0000 mg | ORAL_TABLET | Freq: Every day | ORAL | 2 refills | Status: AC
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
## 2023-12-04 NOTE — Telephone Encounter (Signed)
 Patient missed 8:15AM appointment today with annie for TOC, annie states that this can be scheduled as a nurse visit but patient needs to be contacted to schedule a 2 week post partum video visit. Ivin Marrow would like someone to reach out to patient and schedule. Thank you. KW

## 2023-12-05 LAB — CERVICOVAGINAL ANCILLARY ONLY
Bacterial Vaginitis (gardnerella): POSITIVE — AB
Candida Glabrata: NEGATIVE
Candida Vaginitis: NEGATIVE
Chlamydia: NEGATIVE
Comment: NEGATIVE
Comment: NEGATIVE
Comment: NEGATIVE
Comment: NEGATIVE
Comment: NEGATIVE
Comment: NORMAL
Neisseria Gonorrhea: NEGATIVE
Trichomonas: NEGATIVE

## 2023-12-07 ENCOUNTER — Encounter: Payer: Self-pay | Admitting: Certified Nurse Midwife

## 2023-12-07 ENCOUNTER — Other Ambulatory Visit: Payer: Self-pay | Admitting: Certified Nurse Midwife

## 2023-12-07 MED ORDER — METRONIDAZOLE 500 MG PO TABS: 500.0000 mg | ORAL_TABLET | Freq: Two times a day (BID) | ORAL | 0 refills | Status: DC

## 2023-12-23 ENCOUNTER — Other Ambulatory Visit: Payer: Self-pay

## 2023-12-23 MED ORDER — METRONIDAZOLE 500 MG PO TABS: 500.0000 mg | ORAL_TABLET | Freq: Two times a day (BID) | ORAL | 0 refills | Status: AC

## 2024-01-06 DIAGNOSIS — Z1331 Encounter for screening for depression: Secondary | ICD-10-CM | POA: Insufficient documentation

## 2024-01-07 ENCOUNTER — Telehealth: Payer: Self-pay | Admitting: Certified Nurse Midwife

## 2024-01-07 ENCOUNTER — Ambulatory Visit: Admitting: Certified Nurse Midwife

## 2024-01-07 NOTE — Telephone Encounter (Signed)
 Reached out to pt to reschedule 6 week pp visit that was scheduled on 01/07/2024 at 11:15 with Alise Appl.  Left message for pt to call back to reschedule.

## 2024-01-08 ENCOUNTER — Encounter: Payer: Self-pay | Admitting: Certified Nurse Midwife

## 2024-01-08 NOTE — Telephone Encounter (Signed)
 Reached out to pt (2x) to reschedule 6 week pp visit that was scheduled on 01/07/2024 at 11:15 with Alise Appl.  Left message for pt to call back to reschedule.  Will send a MyChart letter to pt.

## 2024-01-23 ENCOUNTER — Telehealth: Payer: Self-pay | Admitting: Certified Nurse Midwife

## 2024-01-23 ENCOUNTER — Ambulatory Visit: Admitting: Certified Nurse Midwife

## 2024-01-23 NOTE — Telephone Encounter (Signed)
 Reached out to pt to reschedule 6 week pp visit that was scheduled on 01/23/2024 at 1:55 with Alise Appl.  Was able to reschedule per pt request to 02/07/2024 at 2:35 with Alise Appl.

## 2024-02-07 ENCOUNTER — Ambulatory Visit: Admitting: Certified Nurse Midwife

## 2024-02-10 ENCOUNTER — Telehealth: Payer: Self-pay | Admitting: Certified Nurse Midwife

## 2024-02-10 NOTE — Telephone Encounter (Signed)
 Reached out to pt to reschedule 6 week pp visit that was scheduled on 02/07/2024 at 2:35 with Zelda Hummer.  Could not leave a message bc call could not be completed.

## 2024-02-11 ENCOUNTER — Encounter: Payer: Self-pay | Admitting: Certified Nurse Midwife

## 2024-02-11 NOTE — Telephone Encounter (Signed)
 Reached out to pt (2x) to reschedule 6 week pp visit that was scheduled on 02/07/2024 at 2:35 with Zelda Hummer.  Could not leave a message bc call could not be completed.  Will send a MyChart letter.

## 2024-03-09 ENCOUNTER — Other Ambulatory Visit: Payer: Self-pay | Admitting: Certified Nurse Midwife
